# Patient Record
Sex: Female | Born: 1950
Health system: Southern US, Community
[De-identification: ages and names within clinical notes are randomized; demographics above are authoritative.]

## PROBLEM LIST (undated history)

## (undated) DIAGNOSIS — Z923 Personal history of irradiation: Secondary | ICD-10-CM

## (undated) DIAGNOSIS — J449 Chronic obstructive pulmonary disease, unspecified: Secondary | ICD-10-CM

## (undated) DIAGNOSIS — I70209 Unspecified atherosclerosis of native arteries of extremities, unspecified extremity: Secondary | ICD-10-CM

## (undated) DIAGNOSIS — R0600 Dyspnea, unspecified: Secondary | ICD-10-CM

## (undated) DIAGNOSIS — D649 Anemia, unspecified: Secondary | ICD-10-CM

## (undated) DIAGNOSIS — IMO0002 Reserved for concepts with insufficient information to code with codable children: Secondary | ICD-10-CM

## (undated) DIAGNOSIS — K219 Gastro-esophageal reflux disease without esophagitis: Secondary | ICD-10-CM

## (undated) DIAGNOSIS — Z1239 Encounter for other screening for malignant neoplasm of breast: Secondary | ICD-10-CM

## (undated) DIAGNOSIS — J189 Pneumonia, unspecified organism: Secondary | ICD-10-CM

## (undated) DIAGNOSIS — I2510 Atherosclerotic heart disease of native coronary artery without angina pectoris: Secondary | ICD-10-CM

## (undated) DIAGNOSIS — N393 Stress incontinence (female) (male): Secondary | ICD-10-CM

## (undated) DIAGNOSIS — E7800 Pure hypercholesterolemia, unspecified: Secondary | ICD-10-CM

## (undated) DIAGNOSIS — Z8719 Personal history of other diseases of the digestive system: Secondary | ICD-10-CM

## (undated) DIAGNOSIS — Z87891 Personal history of nicotine dependence: Secondary | ICD-10-CM

## (undated) DIAGNOSIS — C8590 Non-Hodgkin lymphoma, unspecified, unspecified site: Secondary | ICD-10-CM

## (undated) DIAGNOSIS — R1310 Dysphagia, unspecified: Secondary | ICD-10-CM

## (undated) DIAGNOSIS — K760 Fatty (change of) liver, not elsewhere classified: Secondary | ICD-10-CM

## (undated) DIAGNOSIS — M1990 Unspecified osteoarthritis, unspecified site: Secondary | ICD-10-CM

## (undated) DIAGNOSIS — R1909 Other intra-abdominal and pelvic swelling, mass and lump: Secondary | ICD-10-CM

## (undated) DIAGNOSIS — Z9221 Personal history of antineoplastic chemotherapy: Secondary | ICD-10-CM

## (undated) DIAGNOSIS — T7840XA Allergy, unspecified, initial encounter: Secondary | ICD-10-CM

## (undated) DIAGNOSIS — C774 Secondary and unspecified malignant neoplasm of inguinal and lower limb lymph nodes: Secondary | ICD-10-CM

## (undated) DIAGNOSIS — IMO0001 Reserved for inherently not codable concepts without codable children: Secondary | ICD-10-CM

## (undated) DIAGNOSIS — U071 COVID-19: Secondary | ICD-10-CM

## (undated) DIAGNOSIS — I10 Essential (primary) hypertension: Secondary | ICD-10-CM

## (undated) DIAGNOSIS — K648 Other hemorrhoids: Secondary | ICD-10-CM

## (undated) HISTORY — DX: Encounter for other screening for malignant neoplasm of breast: Z12.39

## (undated) HISTORY — DX: Personal history of antineoplastic chemotherapy: Z92.21

## (undated) HISTORY — DX: Allergy, unspecified, initial encounter: T78.40XA

## (undated) HISTORY — DX: Personal history of irradiation: Z92.3

## (undated) HISTORY — DX: Personal history of nicotine dependence: Z87.891

## (undated) HISTORY — DX: Reserved for concepts with insufficient information to code with codable children: IMO0002

## (undated) HISTORY — DX: Reserved for inherently not codable concepts without codable children: IMO0001

## (undated) HISTORY — DX: Essential (primary) hypertension: I10

## (undated) HISTORY — PX: SKIN CANCER EXCISION: SHX779

## (undated) HISTORY — DX: Secondary and unspecified malignant neoplasm of inguinal and lower limb lymph nodes: C77.4

## (undated) HISTORY — DX: Other hemorrhoids: K64.8

## (undated) HISTORY — DX: Pure hypercholesterolemia, unspecified: E78.00

## (undated) HISTORY — PX: FRACTURE SURGERY: SHX138

## (undated) HISTORY — PX: WRIST SURGERY: SHX841

---

## 1968-08-28 DIAGNOSIS — IMO0002 Reserved for concepts with insufficient information to code with codable children: Secondary | ICD-10-CM

## 1968-08-28 HISTORY — PX: APPENDECTOMY: SHX54

## 1968-08-28 HISTORY — DX: Reserved for concepts with insufficient information to code with codable children: IMO0002

## 1980-08-28 HISTORY — PX: PATELLA FRACTURE SURGERY: SHX735

## 2008-05-06 ENCOUNTER — Ambulatory Visit: Payer: Self-pay | Admitting: Family Medicine

## 2008-08-28 DIAGNOSIS — I10 Essential (primary) hypertension: Secondary | ICD-10-CM

## 2008-08-28 HISTORY — DX: Essential (primary) hypertension: I10

## 2010-01-10 ENCOUNTER — Ambulatory Visit: Payer: Self-pay | Admitting: Family Medicine

## 2011-02-01 ENCOUNTER — Ambulatory Visit: Payer: Self-pay | Admitting: Family Medicine

## 2011-08-29 DIAGNOSIS — Z923 Personal history of irradiation: Secondary | ICD-10-CM

## 2011-08-29 DIAGNOSIS — Z9221 Personal history of antineoplastic chemotherapy: Secondary | ICD-10-CM

## 2011-08-29 DIAGNOSIS — K648 Other hemorrhoids: Secondary | ICD-10-CM

## 2011-08-29 HISTORY — DX: Personal history of irradiation: Z92.3

## 2011-08-29 HISTORY — DX: Personal history of antineoplastic chemotherapy: Z92.21

## 2011-08-29 HISTORY — DX: Other hemorrhoids: K64.8

## 2012-01-09 ENCOUNTER — Ambulatory Visit: Payer: Self-pay | Admitting: General Surgery

## 2012-01-09 LAB — BASIC METABOLIC PANEL
BUN: 12 mg/dL (ref 7–18)
Co2: 27 mmol/L (ref 21–32)
EGFR (African American): >60
EGFR (Non-African Amer.): >60
Glucose: 97 mg/dL (ref 65–99)
Sodium: 139 mmol/L (ref 136–145)

## 2012-01-10 HISTORY — PX: HERNIA REPAIR: SHX51

## 2012-01-24 ENCOUNTER — Ambulatory Visit: Payer: Self-pay | Admitting: General Surgery

## 2012-01-24 HISTORY — PX: GROIN MASS OPEN BIOPSY: SHX1714

## 2012-02-01 ENCOUNTER — Ambulatory Visit: Payer: Self-pay | Admitting: General Surgery

## 2012-02-05 ENCOUNTER — Ambulatory Visit: Payer: Self-pay | Admitting: Oncology

## 2012-02-05 LAB — URINALYSIS, COMPLETE
Bacteria: NONE SEEN
Bilirubin,UR: NEGATIVE
Glucose,UR: NEGATIVE mg/dL (ref 0–75)
Ketone: NEGATIVE
Protein: NEGATIVE
RBC,UR: <1 /HPF (ref 0–5)
Specific Gravity: 1.018 (ref 1.003–1.030)
Squamous Epithelial: 3
WBC UR: 2 /HPF (ref 0–5)

## 2012-02-05 LAB — COMPREHENSIVE METABOLIC PANEL
Albumin: 4.4 g/dL (ref 3.4–5.0)
Alkaline Phosphatase: 68 U/L (ref 50–136)
Anion Gap: 9 (ref 7–16)
BUN: 15 mg/dL (ref 7–18)
Bilirubin,Total: 0.3 mg/dL (ref 0.2–1.0)
Chloride: 103 mmol/L (ref 98–107)
Co2: 29 mmol/L (ref 21–32)
EGFR (Non-African Amer.): >60
Glucose: 106 mg/dL — ABNORMAL HIGH (ref 65–99)
Osmolality: 283 (ref 275–301)
Potassium: 4.4 mmol/L (ref 3.5–5.1)
SGPT (ALT): 43 U/L
Total Protein: 7.9 g/dL (ref 6.4–8.2)

## 2012-02-05 LAB — CBC CANCER CENTER
Basophil #: 0 x10 3/mm (ref 0.0–0.1)
Eosinophil #: 0.1 x10 3/mm (ref 0.0–0.7)
Eosinophil %: 2.1 %
HGB: 14.1 g/dL (ref 12.0–16.0)
Lymphocyte #: 2.5 x10 3/mm (ref 1.0–3.6)
Monocyte #: 0.4 x10 3/mm (ref 0.2–0.9)
Monocyte %: 7.4 %
Neutrophil #: 2.7 x10 3/mm (ref 1.4–6.5)
Platelet: 269 x10 3/mm (ref 150–440)
RDW: 12.7 % (ref 11.5–14.5)
WBC: 5.8 x10 3/mm (ref 3.6–11.0)

## 2012-02-06 LAB — CEA: CEA: 2.2 ng/mL (ref 0.0–4.7)

## 2012-02-16 ENCOUNTER — Ambulatory Visit: Payer: Self-pay | Admitting: Otolaryngology

## 2012-02-26 ENCOUNTER — Ambulatory Visit: Payer: Self-pay | Admitting: Oncology

## 2012-02-27 ENCOUNTER — Ambulatory Visit: Payer: Self-pay | Admitting: Family Medicine

## 2012-03-07 ENCOUNTER — Ambulatory Visit: Payer: Self-pay | Admitting: Gynecologic Oncology

## 2012-03-07 LAB — BASIC METABOLIC PANEL
BUN: 15 mg/dL (ref 7–18)
Chloride: 106 mmol/L (ref 98–107)
Co2: 27 mmol/L (ref 21–32)
Creatinine: 0.7 mg/dL (ref 0.60–1.30)
Potassium: 4.2 mmol/L (ref 3.5–5.1)
Sodium: 140 mmol/L (ref 136–145)

## 2012-03-07 LAB — CBC
MCH: 31 pg (ref 26.0–34.0)
MCV: 90 fL (ref 80–100)
Platelet: 288 10*3/uL (ref 150–440)
RBC: 4.22 10*6/uL (ref 3.80–5.20)
RDW: 12.5 % (ref 11.5–14.5)

## 2012-03-11 ENCOUNTER — Ambulatory Visit: Payer: Self-pay | Admitting: Family Medicine

## 2012-03-12 ENCOUNTER — Ambulatory Visit: Payer: Self-pay | Admitting: Gynecologic Oncology

## 2012-03-18 LAB — PATHOLOGY REPORT

## 2012-03-27 HISTORY — PX: ANOSCOPY: SHX299

## 2012-03-28 ENCOUNTER — Ambulatory Visit: Payer: Self-pay | Admitting: Oncology

## 2012-04-08 LAB — CBC CANCER CENTER
Basophil %: 0.9 %
Eosinophil #: 0.1 x10 3/mm (ref 0.0–0.7)
Eosinophil %: 2 %
HCT: 39.4 % (ref 35.0–47.0)
HGB: 13.6 g/dL (ref 12.0–16.0)
Lymphocyte #: 2.3 x10 3/mm (ref 1.0–3.6)
MCHC: 34.5 g/dL (ref 32.0–36.0)
MCV: 90 fL (ref 80–100)
Monocyte #: 0.4 x10 3/mm (ref 0.2–0.9)
Monocyte %: 8.2 %
Neutrophil #: 2.4 x10 3/mm (ref 1.4–6.5)
RBC: 4.35 10*6/uL (ref 3.80–5.20)
WBC: 5.2 x10 3/mm (ref 3.6–11.0)

## 2012-04-08 LAB — BASIC METABOLIC PANEL
Anion Gap: 10 (ref 7–16)
Calcium, Total: 9.6 mg/dL (ref 8.5–10.1)
Co2: 28 mmol/L (ref 21–32)
Creatinine: 0.73 mg/dL (ref 0.60–1.30)
EGFR (African American): >60
Potassium: 4 mmol/L (ref 3.5–5.1)
Sodium: 141 mmol/L (ref 136–145)

## 2012-04-22 LAB — CBC CANCER CENTER
Basophil #: 0 x10 3/mm (ref 0.0–0.1)
Eosinophil #: 0.2 x10 3/mm (ref 0.0–0.7)
Eosinophil %: 2.4 %
HCT: 39.1 % (ref 35.0–47.0)
HGB: 13.4 g/dL (ref 12.0–16.0)
Lymphocyte #: 2.3 x10 3/mm (ref 1.0–3.6)
Lymphocyte %: 27.6 %
MCH: 31.2 pg (ref 26.0–34.0)
MCHC: 34.2 g/dL (ref 32.0–36.0)
Monocyte #: 0.7 x10 3/mm (ref 0.2–0.9)
Monocyte %: 8 %
Neutrophil #: 5.1 x10 3/mm (ref 1.4–6.5)
Neutrophil %: 61.6 %
Platelet: 241 x10 3/mm (ref 150–440)
RBC: 4.28 10*6/uL (ref 3.80–5.20)
WBC: 8.2 x10 3/mm (ref 3.6–11.0)

## 2012-04-22 LAB — COMPREHENSIVE METABOLIC PANEL
Albumin: 3.9 g/dL (ref 3.4–5.0)
Alkaline Phosphatase: 58 U/L (ref 50–136)
BUN: 15 mg/dL (ref 7–18)
Bilirubin,Total: 0.3 mg/dL (ref 0.2–1.0)
Calcium, Total: 9 mg/dL (ref 8.5–10.1)
Chloride: 101 mmol/L (ref 98–107)
Creatinine: 0.74 mg/dL (ref 0.60–1.30)
EGFR (African American): >60
EGFR (Non-African Amer.): >60
Osmolality: 277 (ref 275–301)
Potassium: 4.3 mmol/L (ref 3.5–5.1)
SGPT (ALT): 40 U/L (ref 12–78)

## 2012-04-28 ENCOUNTER — Ambulatory Visit: Payer: Self-pay | Admitting: Oncology

## 2012-04-30 LAB — CBC CANCER CENTER
Basophil %: 0.9 %
Eosinophil #: 0.2 x10 3/mm (ref 0.0–0.7)
Eosinophil %: 4.1 %
HCT: 37.4 % (ref 35.0–47.0)
HGB: 12.6 g/dL (ref 12.0–16.0)
Lymphocyte #: 1.2 x10 3/mm (ref 1.0–3.6)
Lymphocyte %: 28.3 %
MCH: 31 pg (ref 26.0–34.0)
MCV: 92 fL (ref 80–100)
Monocyte #: 0.5 x10 3/mm (ref 0.2–0.9)
Monocyte %: 11.9 %
Platelet: 199 x10 3/mm (ref 150–440)
RBC: 4.06 10*6/uL (ref 3.80–5.20)
WBC: 4.4 x10 3/mm (ref 3.6–11.0)

## 2012-04-30 LAB — COMPREHENSIVE METABOLIC PANEL
Albumin: 3.7 g/dL (ref 3.4–5.0)
Anion Gap: 8 (ref 7–16)
Bilirubin,Total: 0.3 mg/dL (ref 0.2–1.0)
Calcium, Total: 8.9 mg/dL (ref 8.5–10.1)
Chloride: 106 mmol/L (ref 98–107)
EGFR (African American): >60
Osmolality: 284 (ref 275–301)
Potassium: 4.2 mmol/L (ref 3.5–5.1)
SGOT(AST): 23 U/L (ref 15–37)
Sodium: 142 mmol/L (ref 136–145)
Total Protein: 6.9 g/dL (ref 6.4–8.2)

## 2012-05-07 LAB — CBC CANCER CENTER
Basophil #: 0 x10 3/mm (ref 0.0–0.1)
Eosinophil %: 5 %
HCT: 37.9 % (ref 35.0–47.0)
Lymphocyte #: 1 x10 3/mm (ref 1.0–3.6)
Lymphocyte %: 21.8 %
MCHC: 33.5 g/dL (ref 32.0–36.0)
MCV: 92 fL (ref 80–100)
Monocyte %: 10.1 %
Neutrophil #: 2.9 x10 3/mm (ref 1.4–6.5)
RBC: 4.13 10*6/uL (ref 3.80–5.20)
RDW: 13.6 % (ref 11.5–14.5)
WBC: 4.6 x10 3/mm (ref 3.6–11.0)

## 2012-05-07 LAB — COMPREHENSIVE METABOLIC PANEL
Albumin: 3.6 g/dL (ref 3.4–5.0)
Alkaline Phosphatase: 60 U/L (ref 50–136)
Anion Gap: 8 (ref 7–16)
BUN: 9 mg/dL (ref 7–18)
Bilirubin,Total: 0.2 mg/dL (ref 0.2–1.0)
Calcium, Total: 9.3 mg/dL (ref 8.5–10.1)
Creatinine: 0.84 mg/dL (ref 0.60–1.30)
EGFR (African American): >60
EGFR (Non-African Amer.): >60
Glucose: 126 mg/dL — ABNORMAL HIGH (ref 65–99)
Potassium: 4.3 mmol/L (ref 3.5–5.1)
SGOT(AST): 26 U/L (ref 15–37)
SGPT (ALT): 52 U/L (ref 12–78)
Total Protein: 6.9 g/dL (ref 6.4–8.2)

## 2012-05-14 LAB — CBC CANCER CENTER
Eosinophil %: 6.6 %
HGB: 12.8 g/dL (ref 12.0–16.0)
Lymphocyte #: 1.2 x10 3/mm (ref 1.0–3.6)
Lymphocyte %: 28.4 %
MCV: 91 fL (ref 80–100)
Monocyte %: 6.7 %
Neutrophil %: 57.4 %
Platelet: 189 x10 3/mm (ref 150–440)
RBC: 4.15 10*6/uL (ref 3.80–5.20)
WBC: 4.2 x10 3/mm (ref 3.6–11.0)

## 2012-05-14 LAB — COMPREHENSIVE METABOLIC PANEL
Albumin: 3.7 g/dL (ref 3.4–5.0)
Anion Gap: 6 — ABNORMAL LOW (ref 7–16)
BUN: 12 mg/dL (ref 7–18)
Bilirubin,Total: 0.2 mg/dL (ref 0.2–1.0)
Chloride: 105 mmol/L (ref 98–107)
EGFR (Non-African Amer.): >60
Glucose: 146 mg/dL — ABNORMAL HIGH (ref 65–99)
Osmolality: 284 (ref 275–301)
Potassium: 3.8 mmol/L (ref 3.5–5.1)
Sodium: 141 mmol/L (ref 136–145)
Total Protein: 6.9 g/dL (ref 6.4–8.2)

## 2012-05-21 LAB — COMPREHENSIVE METABOLIC PANEL
Albumin: 3.7 g/dL (ref 3.4–5.0)
Alkaline Phosphatase: 61 U/L (ref 50–136)
Anion Gap: 8 (ref 7–16)
Bilirubin,Total: 0.3 mg/dL (ref 0.2–1.0)
Calcium, Total: 9.3 mg/dL (ref 8.5–10.1)
Chloride: 104 mmol/L (ref 98–107)
Co2: 27 mmol/L (ref 21–32)
Creatinine: 0.86 mg/dL (ref 0.60–1.30)
EGFR (African American): >60
EGFR (Non-African Amer.): >60
Glucose: 130 mg/dL — ABNORMAL HIGH (ref 65–99)
Osmolality: 278 (ref 275–301)
Potassium: 4.4 mmol/L (ref 3.5–5.1)
Sodium: 139 mmol/L (ref 136–145)
Total Protein: 7.2 g/dL (ref 6.4–8.2)

## 2012-05-21 LAB — CBC CANCER CENTER
Basophil %: 0.8 %
Eosinophil #: 0.1 x10 3/mm (ref 0.0–0.7)
HCT: 38.4 % (ref 35.0–47.0)
HGB: 13 g/dL (ref 12.0–16.0)
Lymphocyte %: 11.8 %
MCH: 31.4 pg (ref 26.0–34.0)
Monocyte #: 0.6 x10 3/mm (ref 0.2–0.9)
Monocyte %: 6.3 %
Neutrophil #: 7 x10 3/mm — ABNORMAL HIGH (ref 1.4–6.5)
Neutrophil %: 80.1 %
RBC: 4.13 10*6/uL (ref 3.80–5.20)
WBC: 8.8 x10 3/mm (ref 3.6–11.0)

## 2012-05-28 ENCOUNTER — Ambulatory Visit: Payer: Self-pay | Admitting: Oncology

## 2012-06-28 ENCOUNTER — Ambulatory Visit: Payer: Self-pay | Admitting: Oncology

## 2012-07-09 LAB — COMPREHENSIVE METABOLIC PANEL
Albumin: 4.3 g/dL (ref 3.4–5.0)
Alkaline Phosphatase: 55 U/L (ref 50–136)
Anion Gap: 11 (ref 7–16)
BUN: 9 mg/dL (ref 7–18)
Bilirubin,Total: 0.2 mg/dL (ref 0.2–1.0)
Chloride: 103 mmol/L (ref 98–107)
Creatinine: 0.66 mg/dL (ref 0.60–1.30)
Glucose: 103 mg/dL — ABNORMAL HIGH (ref 65–99)
Potassium: 3.8 mmol/L (ref 3.5–5.1)
SGOT(AST): 31 U/L (ref 15–37)
Sodium: 142 mmol/L (ref 136–145)
Total Protein: 7.7 g/dL (ref 6.4–8.2)

## 2012-07-09 LAB — CBC CANCER CENTER
Basophil #: 0 x10 3/mm (ref 0.0–0.1)
Basophil %: 0.7 %
HCT: 40.4 % (ref 35.0–47.0)
HGB: 13.6 g/dL (ref 12.0–16.0)
Lymphocyte #: 1.1 x10 3/mm (ref 1.0–3.6)
Lymphocyte %: 35.6 %
MCV: 95 fL (ref 80–100)
Monocyte %: 10.6 %
Neutrophil #: 1.5 x10 3/mm (ref 1.4–6.5)
Platelet: 222 x10 3/mm (ref 150–440)
RBC: 4.26 10*6/uL (ref 3.80–5.20)
RDW: 15.2 % — ABNORMAL HIGH (ref 11.5–14.5)
WBC: 3 x10 3/mm — ABNORMAL LOW (ref 3.6–11.0)

## 2012-07-28 ENCOUNTER — Ambulatory Visit: Payer: Self-pay | Admitting: Oncology

## 2012-09-02 ENCOUNTER — Ambulatory Visit: Payer: Self-pay | Admitting: Oncology

## 2012-09-28 ENCOUNTER — Ambulatory Visit: Payer: Self-pay | Admitting: Oncology

## 2012-10-26 ENCOUNTER — Ambulatory Visit: Payer: Self-pay | Admitting: Oncology

## 2012-10-31 LAB — COMPREHENSIVE METABOLIC PANEL
Alkaline Phosphatase: 59 U/L (ref 50–136)
Anion Gap: 9 (ref 7–16)
Calcium, Total: 9.2 mg/dL (ref 8.5–10.1)
Chloride: 103 mmol/L (ref 98–107)
Co2: 30 mmol/L (ref 21–32)
Creatinine: 0.9 mg/dL (ref 0.60–1.30)
EGFR (Non-African Amer.): >60
Glucose: 99 mg/dL (ref 65–99)
Potassium: 4.3 mmol/L (ref 3.5–5.1)
SGOT(AST): 28 U/L (ref 15–37)
SGPT (ALT): 49 U/L (ref 12–78)
Sodium: 142 mmol/L (ref 136–145)
Total Protein: 7.6 g/dL (ref 6.4–8.2)

## 2012-10-31 LAB — CBC CANCER CENTER
Eosinophil #: 0 x10 3/mm (ref 0.0–0.7)
HCT: 41.2 % (ref 35.0–47.0)
HGB: 14.4 g/dL (ref 12.0–16.0)
Lymphocyte #: 1.6 x10 3/mm (ref 1.0–3.6)
Lymphocyte %: 30.5 %
MCH: 31.1 pg (ref 26.0–34.0)
MCHC: 34.9 g/dL (ref 32.0–36.0)
RBC: 4.62 10*6/uL (ref 3.80–5.20)
RDW: 12.9 % (ref 11.5–14.5)
WBC: 5.2 x10 3/mm (ref 3.6–11.0)

## 2012-11-26 ENCOUNTER — Ambulatory Visit: Payer: Self-pay | Admitting: Oncology

## 2012-12-26 ENCOUNTER — Ambulatory Visit: Payer: Self-pay | Admitting: Oncology

## 2013-01-26 ENCOUNTER — Ambulatory Visit: Payer: Self-pay | Admitting: Oncology

## 2013-02-03 LAB — COMPREHENSIVE METABOLIC PANEL
Alkaline Phosphatase: 59 U/L (ref 50–136)
Anion Gap: 7 (ref 7–16)
BUN: 9 mg/dL (ref 7–18)
EGFR (African American): >60
EGFR (Non-African Amer.): >60
Glucose: 103 mg/dL — ABNORMAL HIGH (ref 65–99)
Osmolality: 278 (ref 275–301)
SGOT(AST): 21 U/L (ref 15–37)
SGPT (ALT): 39 U/L (ref 12–78)
Sodium: 140 mmol/L (ref 136–145)
Total Protein: 7.4 g/dL (ref 6.4–8.2)

## 2013-02-03 LAB — CBC CANCER CENTER
Basophil #: 0 x10 3/mm (ref 0.0–0.1)
Basophil %: 0.7 %
Eosinophil %: 1.4 %
HGB: 14 g/dL (ref 12.0–16.0)
Lymphocyte %: 33.1 %
MCH: 32.4 pg (ref 26.0–34.0)
MCHC: 35.7 g/dL (ref 32.0–36.0)
MCV: 91 fL (ref 80–100)
Monocyte #: 0.4 x10 3/mm (ref 0.2–0.9)
Monocyte %: 8.1 %
Neutrophil #: 2.7 x10 3/mm (ref 1.4–6.5)
Neutrophil %: 56.7 %
RBC: 4.33 10*6/uL (ref 3.80–5.20)
RDW: 13.4 % (ref 11.5–14.5)
WBC: 4.8 x10 3/mm (ref 3.6–11.0)

## 2013-02-04 LAB — CA 125: CA 125: 10.9 U/mL (ref 0.0–34.0)

## 2013-02-25 ENCOUNTER — Ambulatory Visit: Payer: Self-pay | Admitting: Oncology

## 2013-03-28 ENCOUNTER — Ambulatory Visit: Payer: Self-pay | Admitting: Oncology

## 2013-05-02 ENCOUNTER — Ambulatory Visit: Payer: Self-pay | Admitting: Oncology

## 2013-05-05 ENCOUNTER — Ambulatory Visit: Payer: Self-pay | Admitting: Oncology

## 2013-05-28 ENCOUNTER — Ambulatory Visit: Payer: Self-pay | Admitting: Oncology

## 2013-06-28 ENCOUNTER — Ambulatory Visit: Payer: Self-pay | Admitting: Oncology

## 2013-08-01 ENCOUNTER — Ambulatory Visit: Payer: Self-pay | Admitting: Oncology

## 2013-08-04 ENCOUNTER — Ambulatory Visit: Payer: Self-pay | Admitting: Oncology

## 2013-08-28 ENCOUNTER — Ambulatory Visit: Payer: Self-pay | Admitting: Oncology

## 2013-09-29 ENCOUNTER — Ambulatory Visit: Payer: Self-pay | Admitting: Gynecologic Oncology

## 2013-10-06 ENCOUNTER — Encounter: Payer: Self-pay | Admitting: *Deleted

## 2013-10-06 DIAGNOSIS — C774 Secondary and unspecified malignant neoplasm of inguinal and lower limb lymph nodes: Secondary | ICD-10-CM | POA: Insufficient documentation

## 2013-10-06 DIAGNOSIS — Z9221 Personal history of antineoplastic chemotherapy: Secondary | ICD-10-CM | POA: Insufficient documentation

## 2013-10-26 ENCOUNTER — Ambulatory Visit: Payer: Self-pay | Admitting: Gynecologic Oncology

## 2013-12-01 ENCOUNTER — Ambulatory Visit: Payer: Self-pay | Admitting: Oncology

## 2013-12-01 LAB — COMPREHENSIVE METABOLIC PANEL
ALBUMIN: 4.1 g/dL (ref 3.4–5.0)
ALK PHOS: 57 U/L
ALT: 43 U/L (ref 12–78)
ANION GAP: 10 (ref 7–16)
AST: 24 U/L (ref 15–37)
BUN: 9 mg/dL (ref 7–18)
Bilirubin,Total: 0.3 mg/dL (ref 0.2–1.0)
CO2: 30 mmol/L (ref 21–32)
Calcium, Total: 9.9 mg/dL (ref 8.5–10.1)
Chloride: 103 mmol/L (ref 98–107)
Creatinine: 0.69 mg/dL (ref 0.60–1.30)
EGFR (African American): >60
Glucose: 108 mg/dL — ABNORMAL HIGH (ref 65–99)
Osmolality: 284 (ref 275–301)
Potassium: 4.6 mmol/L (ref 3.5–5.1)
Sodium: 143 mmol/L (ref 136–145)
TOTAL PROTEIN: 7.5 g/dL (ref 6.4–8.2)

## 2013-12-01 LAB — CBC CANCER CENTER
Basophil #: 0 x10 3/mm (ref 0.0–0.1)
Basophil %: 0.7 %
Eosinophil #: 0.1 x10 3/mm (ref 0.0–0.7)
Eosinophil %: 2.2 %
HCT: 41.8 % (ref 35.0–47.0)
HGB: 14.1 g/dL (ref 12.0–16.0)
Lymphocyte #: 1.6 x10 3/mm (ref 1.0–3.6)
Lymphocyte %: 37.2 %
MCH: 30.6 pg (ref 26.0–34.0)
MCHC: 33.6 g/dL (ref 32.0–36.0)
MCV: 91 fL (ref 80–100)
MONO ABS: 0.4 x10 3/mm (ref 0.2–0.9)
Monocyte %: 8.5 %
Neutrophil #: 2.2 x10 3/mm (ref 1.4–6.5)
Neutrophil %: 51.4 %
Platelet: 254 x10 3/mm (ref 150–440)
RBC: 4.6 10*6/uL (ref 3.80–5.20)
RDW: 13.2 % (ref 11.5–14.5)
WBC: 4.3 x10 3/mm (ref 3.6–11.0)

## 2013-12-26 ENCOUNTER — Ambulatory Visit: Payer: Self-pay | Admitting: Oncology

## 2014-01-02 DIAGNOSIS — R05 Cough: Secondary | ICD-10-CM | POA: Insufficient documentation

## 2014-01-02 DIAGNOSIS — R059 Cough, unspecified: Secondary | ICD-10-CM | POA: Insufficient documentation

## 2014-04-07 ENCOUNTER — Ambulatory Visit: Payer: Self-pay | Admitting: Oncology

## 2014-04-13 ENCOUNTER — Ambulatory Visit: Payer: Self-pay | Admitting: Gynecologic Oncology

## 2014-04-28 ENCOUNTER — Ambulatory Visit: Payer: Self-pay | Admitting: Oncology

## 2014-05-28 ENCOUNTER — Ambulatory Visit: Payer: Self-pay | Admitting: Oncology

## 2014-05-28 LAB — COMPREHENSIVE METABOLIC PANEL
ALK PHOS: 52 U/L
ALT: 44 U/L
ANION GAP: 9 (ref 7–16)
Albumin: 3.9 g/dL (ref 3.4–5.0)
BUN: 8 mg/dL (ref 7–18)
Bilirubin,Total: 0.4 mg/dL (ref 0.2–1.0)
CO2: 27 mmol/L (ref 21–32)
Calcium, Total: 9.5 mg/dL (ref 8.5–10.1)
Chloride: 104 mmol/L (ref 98–107)
Creatinine: 0.69 mg/dL (ref 0.60–1.30)
EGFR (Non-African Amer.): >60
GLUCOSE: 113 mg/dL — AB (ref 65–99)
OSMOLALITY: 279 (ref 275–301)
Potassium: 4.4 mmol/L (ref 3.5–5.1)
SGOT(AST): 21 U/L (ref 15–37)
Sodium: 140 mmol/L (ref 136–145)
Total Protein: 7.2 g/dL (ref 6.4–8.2)

## 2014-05-28 LAB — CBC CANCER CENTER
BASOS ABS: 0 x10 3/mm (ref 0.0–0.1)
Basophil %: 0.6 %
Eosinophil #: 0.1 x10 3/mm (ref 0.0–0.7)
Eosinophil %: 2.7 %
HCT: 42.8 % (ref 35.0–47.0)
HGB: 14.7 g/dL (ref 12.0–16.0)
LYMPHS PCT: 34.1 %
Lymphocyte #: 1.4 x10 3/mm (ref 1.0–3.6)
MCH: 31.4 pg (ref 26.0–34.0)
MCHC: 34.3 g/dL (ref 32.0–36.0)
MCV: 92 fL (ref 80–100)
MONOS PCT: 8.5 %
Monocyte #: 0.3 x10 3/mm (ref 0.2–0.9)
Neutrophil #: 2.2 x10 3/mm (ref 1.4–6.5)
Neutrophil %: 54.1 %
PLATELETS: 205 x10 3/mm (ref 150–440)
RBC: 4.68 10*6/uL (ref 3.80–5.20)
RDW: 13.7 % (ref 11.5–14.5)
WBC: 4.1 x10 3/mm (ref 3.6–11.0)

## 2014-06-28 ENCOUNTER — Ambulatory Visit: Payer: Self-pay | Admitting: Oncology

## 2014-06-29 ENCOUNTER — Encounter: Payer: Self-pay | Admitting: *Deleted

## 2014-07-21 ENCOUNTER — Ambulatory Visit: Payer: Self-pay | Admitting: Anesthesiology

## 2014-07-27 ENCOUNTER — Ambulatory Visit: Payer: Self-pay | Admitting: Anesthesiology

## 2014-08-17 ENCOUNTER — Ambulatory Visit: Payer: Self-pay | Admitting: Anesthesiology

## 2014-09-01 ENCOUNTER — Encounter: Payer: Self-pay | Admitting: Anesthesiology

## 2014-09-02 ENCOUNTER — Ambulatory Visit: Payer: Self-pay | Admitting: Oncology

## 2014-09-02 LAB — COMPREHENSIVE METABOLIC PANEL
ALT: 31 U/L
ANION GAP: 10 (ref 7–16)
AST: 16 U/L (ref 15–37)
Albumin: 4 g/dL (ref 3.4–5.0)
Alkaline Phosphatase: 51 U/L
BUN: 10 mg/dL (ref 7–18)
Bilirubin,Total: 0.4 mg/dL (ref 0.2–1.0)
CO2: 28 mmol/L (ref 21–32)
Calcium, Total: 9.5 mg/dL (ref 8.5–10.1)
Chloride: 104 mmol/L (ref 98–107)
Creatinine: 0.7 mg/dL (ref 0.60–1.30)
Glucose: 100 mg/dL — ABNORMAL HIGH (ref 65–99)
Osmolality: 282 (ref 275–301)
Potassium: 4.5 mmol/L (ref 3.5–5.1)
Sodium: 142 mmol/L (ref 136–145)
TOTAL PROTEIN: 7.2 g/dL (ref 6.4–8.2)

## 2014-09-02 LAB — CBC CANCER CENTER
BASOS ABS: 0 x10 3/mm (ref 0.0–0.1)
BASOS PCT: 0.5 %
EOS PCT: 1.4 %
Eosinophil #: 0.1 x10 3/mm (ref 0.0–0.7)
HCT: 41.9 % (ref 35.0–47.0)
HGB: 14.2 g/dL (ref 12.0–16.0)
Lymphocyte #: 1.8 x10 3/mm (ref 1.0–3.6)
Lymphocyte %: 35.5 %
MCH: 31 pg (ref 26.0–34.0)
MCHC: 34 g/dL (ref 32.0–36.0)
MCV: 91 fL (ref 80–100)
MONOS PCT: 8.6 %
Monocyte #: 0.4 x10 3/mm (ref 0.2–0.9)
NEUTROS ABS: 2.8 x10 3/mm (ref 1.4–6.5)
Neutrophil %: 54 %
Platelet: 287 x10 3/mm (ref 150–440)
RBC: 4.6 10*6/uL (ref 3.80–5.20)
RDW: 13.3 % (ref 11.5–14.5)
WBC: 5.1 x10 3/mm (ref 3.6–11.0)

## 2014-09-28 ENCOUNTER — Encounter: Payer: Self-pay | Admitting: Anesthesiology

## 2014-09-28 ENCOUNTER — Ambulatory Visit: Payer: Self-pay | Admitting: Oncology

## 2014-10-27 ENCOUNTER — Ambulatory Visit
Admit: 2014-10-27 | Disposition: A | Discharge status: Home / Self Care | Payer: Self-pay | Attending: Oncology | Admitting: Oncology

## 2014-10-27 ENCOUNTER — Encounter
Admit: 2014-10-27 | Disposition: A | Discharge status: Home / Self Care | Payer: Self-pay | Attending: Anesthesiology | Admitting: Anesthesiology

## 2014-11-27 ENCOUNTER — Ambulatory Visit
Admit: 2014-11-27 | Disposition: A | Discharge status: Home / Self Care | Payer: Self-pay | Attending: Oncology | Admitting: Oncology

## 2014-11-27 ENCOUNTER — Encounter
Admit: 2014-11-27 | Disposition: A | Discharge status: Home / Self Care | Payer: Self-pay | Attending: Anesthesiology | Admitting: Anesthesiology

## 2014-12-09 LAB — COMPREHENSIVE METABOLIC PANEL
ALBUMIN: 4.4 g/dL
ALK PHOS: 47 U/L
ANION GAP: 7 (ref 7–16)
BILIRUBIN TOTAL: 0.6 mg/dL
BUN: 10 mg/dL
CREATININE: 0.62 mg/dL
Calcium, Total: 9.3 mg/dL
Chloride: 103 mmol/L
Co2: 27 mmol/L
EGFR (African American): >60
EGFR (Non-African Amer.): >60
Glucose: 130 mg/dL — ABNORMAL HIGH
POTASSIUM: 3.7 mmol/L
SGOT(AST): 31 U/L
SGPT (ALT): 35 U/L
Sodium: 137 mmol/L
TOTAL PROTEIN: 7.2 g/dL

## 2014-12-09 LAB — CBC CANCER CENTER
BASOS ABS: 0 x10 3/mm (ref 0.0–0.1)
Basophil %: 0.8 %
Eosinophil #: 0.1 x10 3/mm (ref 0.0–0.7)
Eosinophil %: 1.6 %
HCT: 41.5 % (ref 35.0–47.0)
HGB: 14.2 g/dL (ref 12.0–16.0)
LYMPHS ABS: 1.7 x10 3/mm (ref 1.0–3.6)
Lymphocyte %: 35.2 %
MCH: 30.8 pg (ref 26.0–34.0)
MCHC: 34.2 g/dL (ref 32.0–36.0)
MCV: 90 fL (ref 80–100)
Monocyte #: 0.3 x10 3/mm (ref 0.2–0.9)
Monocyte %: 6.7 %
NEUTROS PCT: 55.7 %
Neutrophil #: 2.7 x10 3/mm (ref 1.4–6.5)
Platelet: 284 x10 3/mm (ref 150–440)
RBC: 4.61 10*6/uL (ref 3.80–5.20)
RDW: 12.9 % (ref 11.5–14.5)
WBC: 4.8 x10 3/mm (ref 3.6–11.0)
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
## 2014-12-15 NOTE — Op Note (Signed)
PATIENT NAME:  Kristen Simon, Kristen Simon MR#:  [id number] DATE OF BIRTH:  December 25, 1950  DATE OF PROCEDURE:  03/12/2012  PREOPERATIVE DIAGNOSIS: Metastatic squamous cell carcinoma in the inguinal node.   POSTOPERATIVE DIAGNOSIS: No definite evidence of cervical pathology.   PROCEDURES PERFORMED:  1. Examination under anesthesia.  2. Cold knife conization. 3. Dilatation and curettage.    SURGEON: Weber Cooks, MD  ANESTHESIA: General.   COMPLICATIONS: None.   ESTIMATED BLOOD LOSS: 25 mL.  INDICATION FOR SURGERY: Ms. [name] is a 64 year old patient who was noted to have squamous cell carcinoma in an inguinal node. Initial work up was unremarkable. Special stains of the tumor pointed towards cervical origin. Therefore decision was made to proceed with conization.   FINDINGS AT THE TIME OF SURGERY: Normal external genitalia, urethral meatus, urethra, bladder and vagina. Slightly irregular Lugol stain. Cervix without exocervical lesion. No irregular staining. On palpation the cervix and uterus are small in mid position, mobile and nontender. No adnexal masses or tenderness can be felt.   OPERATIVE REPORT: After adequate general anesthesia had been obtained, the patient was prepped and draped in high lithotomy position. The cervix was visualized. Holding stitches were placed at 9:00 and 6:00. Then Lugol stain was performed. Using the sharp knife a cone-shaped center portion of the cervix was removed. The cervix was then grasped with a single-tooth tenaculum and dilated. Thus, an endometrial curettage could be done. Only a small amount of tissue was received at that time. Then a stitch was placed around anterior and posterior cervical lips thus adequate hemostasis was assured. Small dab of Monsel solution was placed on the cervix.   The patient tolerated the procedure well and was taken to recovery room in stable condition. The postoperative urine was clear. Pad, sponge, needle, and instrument counts  were correct x2.   ____________________________ Weber Cooks, MD bem:cms D: 03/12/2012 13:53:51 ET T: 03/12/2012 14:12:47 ET JOB#: [id number]  cc: Weber Cooks, MD, <Dictator>  Weber Cooks MD ELECTRONICALLY SIGNED 03/19/2012 17:47

## 2014-12-20 NOTE — Op Note (Signed)
PATIENT NAME:  Kristen Simon, Kristen Simon MR#:  [id number] DATE OF BIRTH:  [birth year]  DATE OF PROCEDURE:  02/16/2012  PREOPERATIVE DIAGNOSIS: Right parapharyngeal PET positive tissue with history of right inguinal squamous cell carcinoma.   POSTOPERATIVE DIAGNOSIS: Right parapharyngeal PET positive tissue with history of right inguinal squamous cell carcinoma.   PROCEDURES:  1. Rigid bronchoscopy, direct laryngoscopy with multiple biopsies of the right parapharyngeal tissue, glossopharyngeal fold and right tongue base.  2. Tonsillectomy.  3. Rigid nasopharyngoscopy.   SURGEON: Janalee Dane, MD  DESCRIPTION OF PROCEDURE: The patient was placed in supine position on the Operating Room table. After the patient had been turned 90 degrees counterclockwise from anesthesia and the endotracheal tube had been removed, rigid bronchoscopy was carried out. The carina and primary bronchi were free of any masses or lesions. The endotracheal tube was replaced. Direct laryngoscopy was carried out. No visible lesions were encountered except for the previously noted lesion in the left tonsillar fossa and post-tonsillectomy scars in both tonsils. The left post-tonsillectomy scar and lesion were resected with Bovie electrocautery. The right posterior tonsillar tissue was resected with Bovie electrocautery. 0.5% plain Marcaine was placed. No further lesions were identified. 0 degrees rod was used to examine the nasopharynx. This was without masses or lesions therefore no biopsies were taken there. Patient was then returned to anesthesia, allowed to emerge from anesthesia in the Operating Room, taken to the recovery room in stable condition. There were no complications. Estimated blood loss less than 10 mL.   ____________________________ J. Nadeen Landau, MD jmc:cms D: 02/16/2012 21:07:36 ET T: 02/17/2012 10:09:07 ET  JOB#: [id number] cc: Janalee Dane, MD, <Dictator> Nicholos Johns MD ELECTRONICALLY SIGNED 02/21/2012  12:20

## 2014-12-20 NOTE — Op Note (Signed)
PATIENT NAME:  Kristen Simon, Kristen Simon MR#:  [id number] DATE OF BIRTH:  Jul 18, 1951  DATE OF PROCEDURE:  01/24/2012  PREOPERATIVE DIAGNOSIS: Right femoral hernia.   POSTOPERATIVE DIAGNOSIS: Circumscribed mass in the femoral canal on the right.   OPERATION PERFORMED: Exploration right groin with excision of a mass from the femoral canal.   SURGEON: Mckinley Jewel, MD   ANESTHESIA: General.   COMPLICATIONS: None.   ESTIMATED BLOOD LOSS: Minimal.   DRAINS: None.   PROCEDURE: The patient was put to sleep with an LMA and the right groin area was prepped and draped out as a sterile field. A palpable mass was located right in the area of the femoral canal which was suspicious for a femoral hernia. A transverse incision just overlying this area was made and carefully deepened through the layers down to the region of the femoral vessels and the femoral canal. Bleeding was controlled with cautery. The mass was identified and appeared to be pretty well circumscribed about 3 cm approximately in size, soft in places and firm in others. It did not appear to have any communication into the peritoneal cavity. It was entirely free and the femoral canal area did not appear to contain any evidence of a hernia. This mass was then excised out and sent in formalin for pathology. It was likely an enlarged node or a cystic lesion. No other lymphadenopathy or other abnormal findings were noted in this area. After ensuring hemostasis, the wound was closed with 3-0 Vicryl in the deeper tissues and the skin with subcuticular 4-0 Vicryl reinforced with Steri-Strips and dry sterile dressing was placed.      The patient tolerated the procedure well. There were no immediate problems. She was subsequently returned to the recovery room in stable condition.   ____________________________ S.Robinette Haines, MD sgs:drc D: 01/25/2012 09:18:35 ET T: 01/25/2012 11:45:36 ET JOB#: [id number]  cc: Synthia Innocent. Jamal Collin, MD, <Dictator> [facility] Robinette Haines  MD ELECTRONICALLY SIGNED 01/30/2012 10:33

## 2014-12-28 ENCOUNTER — Other Ambulatory Visit: Payer: Self-pay | Admitting: *Deleted

## 2014-12-28 MED ORDER — HYDROCODONE-ACETAMINOPHEN 10-325 MG PO TABS: 1.0000 | ORAL_TABLET | Freq: Two times a day (BID) | ORAL | Status: DC | PRN

## 2014-12-28 NOTE — Telephone Encounter (Signed)
Requesting refill on Norco.

## 2014-12-30 ENCOUNTER — Encounter: Payer: Self-pay | Admitting: Physical Therapy

## 2015-01-08 ENCOUNTER — Ambulatory Visit: Payer: BLUE CROSS/BLUE SHIELD | Admitting: Physical Therapy

## 2015-01-11 ENCOUNTER — Other Ambulatory Visit: Payer: Self-pay | Admitting: *Deleted

## 2015-01-11 MED ORDER — HYDROCODONE-ACETAMINOPHEN 10-325 MG PO TABS: 1.0000 | ORAL_TABLET | Freq: Two times a day (BID) | ORAL | Status: DC | PRN

## 2015-01-11 NOTE — Telephone Encounter (Signed)
Pt informed rx ready to be picked up.

## 2015-01-13 ENCOUNTER — Ambulatory Visit: Payer: BLUE CROSS/BLUE SHIELD | Attending: Anesthesiology | Admitting: Physical Therapy

## 2015-01-13 ENCOUNTER — Encounter: Payer: Self-pay | Admitting: Physical Therapy

## 2015-01-13 DIAGNOSIS — M6281 Muscle weakness (generalized): Secondary | ICD-10-CM | POA: Insufficient documentation

## 2015-01-13 DIAGNOSIS — S39001S Unspecified injury of muscle, fascia and tendon of abdomen, sequela: Secondary | ICD-10-CM

## 2015-01-13 DIAGNOSIS — M6208 Separation of muscle (nontraumatic), other site: Secondary | ICD-10-CM

## 2015-01-13 NOTE — Patient Instructions (Signed)
  On Elbows (Prone) Stay on elbows count 20 sec, breath and pull belly in on exhale.      (Home) Extension: Hip  Make sure low back does not move. Lift low.      Stand with support. Bend knees slightly. Tighten pelvic floor with exhale up. Return to straight standing and find pelvic neutral "zip zipper.  Repeat 5 times. Do 3 times a day.  Copyright  VHI. All rights reserved.

## 2015-01-13 NOTE — Therapy (Signed)
[place] MAIN [facility] SERVICES [address] [place], [place], [address] Phone: [phone]   Fax:  [phone]  Physical Therapy Treatment  Patient Details  Name: Kristen Simon MRN: [id number] Date of Birth: 08/12/1951 Referring Provider:  Molli Barrows, MD  Encounter Date: 01/13/2015      PT End of Session - 01/13/15 1456    Visit Number 3   Number of Visits 12   Date for PT Re-Evaluation 02/09/15   PT Start Time 6063   PT Stop Time 1140   PT Time Calculation (min) 55 min   Activity Tolerance Patient tolerated treatment well;No increased pain   Behavior During Therapy [facility] for tasks assessed/performed      Past Medical History  Diagnosis Date  . Hypertension 2010  . Personal history of tobacco use, presenting hazards to health   . 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 Hx of radiation therapy 2013  . Internal hemorrhoids with other complication 0160  . Breast screening, unspecified   . History of cancer chemotherapy 2013  . Unilateral or unspecified femoral hernia with obstruction   . Elevated cholesterol   . Ulcer 1970    stomach-resolved    Past Surgical History  Procedure Laterality Date  . Appendectomy  1970  . Fracture surgery  1093,2355    right wrist,right leg  . Groin mass open biopsy Right 01/24/2012    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  . Anoscopy  03/27/2012    anal biopsy/hemorrhoid-benign  . Hernia repair Right 01/10/2012    right femerol hernia    There were no vitals filed for this visit.  Visit Diagnosis:  Muscle weakness (generalized)  Unspecified injury of muscle, fascia and tendon of abdomen, sequela  Separation of abdominal muscles      Subjective Assessment - 01/13/15 1058    Subjective Two weeks ago pt suffered a relapse of back pain after picking up a pitcher of ice tea from lower fridge  shelf from a squat position. This epsiode limited her to bedrest. Sx have improved and her pain today is 4/10.    Pertinent History Cancer Lymphoma, radiation Tx to pelvic area,    Limitations Lifting;Standing;Walking;House hold activities;Other (comment);Sitting  car t/f    Patient Stated Goals 1) get out of pain and get in shape   Pain Score 4    Pain Location Back   Pain Orientation Medial   Pain Radiating Towards occasionally down lateral aspect of her R foot   Multiple Pain Sites Yes   Pain Score 6   Pain Location Groin   Pain Orientation Right   Pain Descriptors / Indicators Dull                         [facility] Adult PT Treatment/Exercise - 01/13/15 1518    Lumbar Exercises: Standing   Functional Squats 10 reps  educated on force closure, technique for lifting    Functional Squats Limitations pt reported needing to sit down after 10 reps, exprsesing fatigue   Knee/Hip Exercises: Prone   Other Prone Exercises prone on elbows and R hip ext with deep core engaged    Other Prone Exercises neuro re-edu on deep core activation to decrease SIJ pain with hip ext    Manual Therapy   Manual Therapy Joint mobilization;Soft tissue mobilization   Manual therapy comments Pre-Tx; Noted sacral  torsion R w/ pain w/ palpation on bil PSIS. increased tensions and tenderness on L coccygeus mm, PA Grade II mobs to R lateral edge of PSIS referred to R groin area.   Post-Tx: sacral symmetry, decreased tenderness/tensions    Joint Mobilization R long axis distraction and PA mob on Lateral border of R sacrum (prone) , MWM w/ R hip ext and prone on elbows   Soft tissue mobilization Applied sustained pressure and STM on L occygeus                PT Education - 01/13/15 1454    Education provided Yes   Education Details Provided pt DVD of hula dancing per pt's interest for increased pelvic ROM/ core strengthening, HEP, lifiting mechanics and hip mechanics related to her injury,  force closure principles: finding pelvic neutral in seated posture and slight anterior tilt of pelvis in squat with realignment into neutral upon rising to stand   Person(s) Educated Patient   Methods Explanation;Demonstration;Tactile cues;Verbal cues;Handout   Comprehension Verbalized understanding;Returned demonstration;Verbal cues required;Tactile cues required          PT Short Term Goals - 01/13/15 1500    PT SHORT TERM GOAL #1   Title --   Time --   Period --   Status --   PT SHORT TERM GOAL #2   Title --   Status --   PT SHORT TERM GOAL #3   Title --   Time --   Period --   Status --           PT Long Term Goals - 01/13/15 1502    PT LONG TERM GOAL #1   Title t will report sleeping continuously for 6 hrs decreased interruption by pain from 3-4x/night to 2x /night for 3/7 nights/week while sleeping on her R side in order to improve QOL. (11/17/14 amd 12/09/14  2-3x Patrcia Dolly instead 3-4x)   Time 12   Period Weeks   Status On-going   PT LONG TERM GOAL #2   Title Pt will report 50% less pain with sexual intercourse in order to improve QOL.   Time 12   Period Weeks   Status On-going   PT LONG TERM GOAL #3   Title  Pt will be able to lift groceries (2 bags) in each hand without pain to perform ADLs.    Time 12   Period Weeks   Status On-going               Plan - 01/13/15 1503    Clinical Impression Statement Pt had not been seen for over a month due travels and relapse of LBP. Pt has recovered from mm strain that occurred 2 weeks ago after lifting pitcher of tea from lower fridge shelf. Pt comes to today's session with 4/10 pain. Her pain decreased to 2/10 after manual Tx which was applied to address sacral torsion R associated with L coccygeus mm tensions. Pt will continue to benefit from skilled PT in order to decrease pain and to address goals related to functional lifting activities, pain with intercourse, and increased sleep quality.   Pt will benefit from  skilled therapeutic intervention in order to improve on the following deficits Abnormal gait;Decreased strength;Impaired sensation;Pain;Difficulty walking;Decreased activity tolerance;Decreased mobility;Decreased balance;Decreased coordination;Postural dysfunction;Decreased endurance;Impaired flexibility;Increased muscle spasms;Improper body mechanics;Increased fascial restricitons   Rehab Potential Good   PT Frequency 1x / week   PT Duration 12 weeks   PT Treatment/Interventions ADLs/Self Care Home Management;Functional mobility training;Wheelchair  mobility training;Energy conservation;Manual techniques;Therapeutic activities;Aquatic Therapy;Biofeedback;Therapeutic exercise;Moist Heat;Neuromuscular re-education;Balance training;Stair training;Patient/family education   PT Next Visit Plan progress strengthening HEP   Consulted and Agree with Plan of Care Patient        Problem List Patient Active Problem List   Diagnosis Date Noted  . 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 History of cancer chemotherapy     Jerl Mina ,PT, DPT, E-RYT  01/13/2015, 3:19 PM  [place] MAIN [facility] SERVICES [address] [place], [place], [address] Phone: [phone]   Fax:  [phone]

## 2015-01-22 ENCOUNTER — Ambulatory Visit: Payer: Self-pay | Admitting: Physical Therapy

## 2015-01-29 ENCOUNTER — Ambulatory Visit: Payer: BLUE CROSS/BLUE SHIELD | Admitting: Physical Therapy

## 2015-01-29 DIAGNOSIS — R0602 Shortness of breath: Secondary | ICD-10-CM | POA: Insufficient documentation

## 2015-01-29 DIAGNOSIS — I10 Essential (primary) hypertension: Secondary | ICD-10-CM | POA: Insufficient documentation

## 2015-01-29 DIAGNOSIS — E782 Mixed hyperlipidemia: Secondary | ICD-10-CM | POA: Insufficient documentation

## 2015-02-04 ENCOUNTER — Encounter: Payer: BLUE CROSS/BLUE SHIELD | Admitting: Physical Therapy

## 2015-02-05 ENCOUNTER — Ambulatory Visit: Payer: BLUE CROSS/BLUE SHIELD | Admitting: Physical Therapy

## 2015-02-08 ENCOUNTER — Ambulatory Visit
Admission: RE | Admit: 2015-02-08 | Payer: BLUE CROSS/BLUE SHIELD | Source: Ambulatory Visit | Admitting: Unknown Physician Specialty

## 2015-02-08 ENCOUNTER — Encounter: Admission: RE | Payer: Self-pay | Source: Ambulatory Visit

## 2015-02-08 SURGERY — COLONOSCOPY WITH PROPOFOL
Anesthesia: General

## 2015-02-09 ENCOUNTER — Telehealth: Payer: Self-pay | Admitting: *Deleted

## 2015-02-09 MED ORDER — HYDROCODONE-ACETAMINOPHEN 10-325 MG PO TABS: 0.5000 | ORAL_TABLET | Freq: Four times a day (QID) | ORAL | Status: DC | PRN

## 2015-02-09 NOTE — Telephone Encounter (Signed)
Deferred UTI to PMD

## 2015-02-09 NOTE — Telephone Encounter (Signed)
Informed that prescription is ready to pick up  

## 2015-02-12 ENCOUNTER — Encounter: Payer: BLUE CROSS/BLUE SHIELD | Admitting: Physical Therapy

## 2015-02-17 ENCOUNTER — Ambulatory Visit: Payer: BLUE CROSS/BLUE SHIELD | Attending: Anesthesiology | Admitting: Physical Therapy

## 2015-02-25 ENCOUNTER — Telehealth: Payer: Self-pay | Admitting: *Deleted

## 2015-02-25 MED ORDER — HYDROCODONE-ACETAMINOPHEN 10-325 MG PO TABS
0.5000 | ORAL_TABLET | Freq: Four times a day (QID) | ORAL | Status: DC | PRN
Start: 2015-02-25 — End: 2015-03-25

## 2015-02-25 NOTE — Telephone Encounter (Signed)
Informed that prescription is ready to pick up  

## 2015-02-26 ENCOUNTER — Encounter: Payer: BLUE CROSS/BLUE SHIELD | Admitting: Physical Therapy

## 2015-03-05 ENCOUNTER — Encounter: Payer: BLUE CROSS/BLUE SHIELD | Admitting: Physical Therapy

## 2015-03-25 ENCOUNTER — Telehealth: Payer: Self-pay | Admitting: *Deleted

## 2015-03-25 MED ORDER — HYDROCODONE-ACETAMINOPHEN 10-325 MG PO TABS: 0.5000 | ORAL_TABLET | Freq: Four times a day (QID) | ORAL | Status: DC | PRN

## 2015-03-25 NOTE — Telephone Encounter (Signed)
Informed that prescription is ready to pick up  

## 2015-04-07 ENCOUNTER — Ambulatory Visit: Payer: BLUE CROSS/BLUE SHIELD

## 2015-04-27 ENCOUNTER — Other Ambulatory Visit: Payer: Self-pay | Admitting: *Deleted

## 2015-04-27 MED ORDER — HYDROCODONE-ACETAMINOPHEN 10-325 MG PO TABS: 0.5000 | ORAL_TABLET | Freq: Four times a day (QID) | ORAL | Status: DC | PRN

## 2015-04-28 ENCOUNTER — Inpatient Hospital Stay: Payer: BLUE CROSS/BLUE SHIELD

## 2015-05-05 ENCOUNTER — Inpatient Hospital Stay: Payer: BLUE CROSS/BLUE SHIELD

## 2015-05-12 ENCOUNTER — Inpatient Hospital Stay: Payer: BLUE CROSS/BLUE SHIELD | Attending: Obstetrics and Gynecology | Admitting: Obstetrics and Gynecology

## 2015-05-12 VITALS — BP 159/90 | HR 99 | Temp 97.6°F | Resp 18 | Ht 62.0 in | Wt 141.5 lb

## 2015-05-12 DIAGNOSIS — Z716 Tobacco abuse counseling: Secondary | ICD-10-CM

## 2015-05-12 DIAGNOSIS — Z79899 Other long term (current) drug therapy: Secondary | ICD-10-CM | POA: Insufficient documentation

## 2015-05-12 DIAGNOSIS — Z9221 Personal history of antineoplastic chemotherapy: Secondary | ICD-10-CM | POA: Diagnosis not present

## 2015-05-12 DIAGNOSIS — I10 Essential (primary) hypertension: Secondary | ICD-10-CM | POA: Insufficient documentation

## 2015-05-12 DIAGNOSIS — Z923 Personal history of irradiation: Secondary | ICD-10-CM | POA: Diagnosis not present

## 2015-05-12 DIAGNOSIS — F17210 Nicotine dependence, cigarettes, uncomplicated: Secondary | ICD-10-CM | POA: Insufficient documentation

## 2015-05-12 DIAGNOSIS — C774 Secondary and unspecified malignant neoplasm of inguinal and lower limb lymph nodes: Secondary | ICD-10-CM | POA: Diagnosis not present

## 2015-05-12 DIAGNOSIS — C801 Malignant (primary) neoplasm, unspecified: Secondary | ICD-10-CM

## 2015-05-12 DIAGNOSIS — E780 Pure hypercholesterolemia: Secondary | ICD-10-CM | POA: Diagnosis not present

## 2015-05-12 NOTE — Patient Instructions (Signed)
Smoking Cessation Quitting smoking is important to your health and has many advantages. However, it is not always easy to quit since nicotine is a very addictive drug. Oftentimes, people try 3 times or more before being able to quit. This document explains the best ways for you to prepare to quit smoking. Quitting takes hard work and a lot of effort, but you can do it. ADVANTAGES OF QUITTING SMOKING  You will live longer, feel better, and live better.  Your body will feel the impact of quitting smoking almost immediately.  Within 20 minutes, blood pressure decreases. Your pulse returns to its normal level.  After 8 hours, carbon monoxide levels in the blood return to normal. Your oxygen level increases.  After 24 hours, the chance of having a heart attack starts to decrease. Your breath, hair, and body stop smelling like smoke.  After 48 hours, damaged nerve endings begin to recover. Your sense of taste and smell improve.  After 72 hours, the body is virtually free of nicotine. Your bronchial tubes relax and breathing becomes easier.  After 2 to 12 weeks, lungs can hold more air. Exercise becomes easier and circulation improves.  The risk of having a heart attack, stroke, cancer, or lung disease is greatly reduced.  After 1 year, the risk of coronary heart disease is cut in half.  After 5 years, the risk of stroke falls to the same as a nonsmoker.  After 10 years, the risk of lung cancer is cut in half and the risk of other cancers decreases significantly.  After 15 years, the risk of coronary heart disease drops, usually to the level of a nonsmoker.  If you are pregnant, quitting smoking will improve your chances of having a healthy baby.  The people you live with, especially any children, will be healthier.  You will have extra money to spend on things other than cigarettes. QUESTIONS TO THINK ABOUT BEFORE ATTEMPTING TO QUIT You may want to talk about your answers with your  health care provider.  Why do you want to quit?  If you tried to quit in the past, what helped and what did not?  What will be the most difficult situations for you after you quit? How will you plan to handle them?  Who can help you through the tough times? Your family? Friends? A health care provider?  What pleasures do you get from smoking? What ways can you still get pleasure if you quit? Here are some questions to ask your health care provider:  How can you help me to be successful at quitting?  What medicine do you think would be best for me and how should I take it?  What should I do if I need more help?  What is smoking withdrawal like? How can I get information on withdrawal? GET READY  Set a quit date.  Change your environment by getting rid of all cigarettes, ashtrays, matches, and lighters in your home, car, or work. Do not let people smoke in your home.  Review your past attempts to quit. Think about what worked and what did not. GET SUPPORT AND ENCOURAGEMENT You have a better chance of being successful if you have help. You can get support in many ways.  Tell your family, friends, and coworkers that you are going to quit and need their support. Ask them not to smoke around you.  Get individual, group, or telephone counseling and support. Programs are available at local hospitals and health centers. Call   your local health department for information about programs in your area.  Spiritual beliefs and practices may help some smokers quit.  Download a "quit meter" on your computer to keep track of quit statistics, such as how long you have gone without smoking, cigarettes not smoked, and money saved.  Get a self-help book about quitting smoking and staying off tobacco. LEARN NEW SKILLS AND BEHAVIORS  Distract yourself from urges to smoke. Talk to someone, go for a walk, or occupy your time with a task.  Change your normal routine. Take a different route to work.  Drink tea instead of coffee. Eat breakfast in a different place.  Reduce your stress. Take a hot bath, exercise, or read a book.  Plan something enjoyable to do every day. Reward yourself for not smoking.  Explore interactive web-based programs that specialize in helping you quit. GET MEDICINE AND USE IT CORRECTLY Medicines can help you stop smoking and decrease the urge to smoke. Combining medicine with the above behavioral methods and support can greatly increase your chances of successfully quitting smoking.  Nicotine replacement therapy helps deliver nicotine to your body without the negative effects and risks of smoking. Nicotine replacement therapy includes nicotine gum, lozenges, inhalers, nasal sprays, and skin patches. Some may be available over-the-counter and others require a prescription.  Antidepressant medicine helps people abstain from smoking, but how this works is unknown. This medicine is available by prescription.  Nicotinic receptor partial agonist medicine simulates the effect of nicotine in your brain. This medicine is available by prescription. Ask your health care provider for advice about which medicines to use and how to use them based on your health history. Your health care provider will tell you what side effects to look out for if you choose to be on a medicine or therapy. Carefully read the information on the package. Do not use any other product containing nicotine while using a nicotine replacement product.  RELAPSE OR DIFFICULT SITUATIONS Most relapses occur within the first 3 months after quitting. Do not be discouraged if you start smoking again. Remember, most people try several times before finally quitting. You may have symptoms of withdrawal because your body is used to nicotine. You may crave cigarettes, be irritable, feel very hungry, cough often, get headaches, or have difficulty concentrating. The withdrawal symptoms are only temporary. They are strongest  when you first quit, but they will go away within 10-14 days. To reduce the chances of relapse, try to:  Avoid drinking alcohol. Drinking lowers your chances of successfully quitting.  Reduce the amount of caffeine you consume. Once you quit smoking, the amount of caffeine in your body increases and can give you symptoms, such as a rapid heartbeat, sweating, and anxiety.  Avoid smokers because they can make you want to smoke.  Do not let weight gain distract you. Many smokers will gain weight when they quit, usually less than 10 pounds. Eat a healthy diet and stay active. You can always lose the weight gained after you quit.  Find ways to improve your mood other than smoking. FOR MORE INFORMATION  www.smokefree.gov  Document Released: 08/08/2001 Document Revised: 12/29/2013 Document Reviewed: 11/23/2011 [employer] Patient Information 2015 [employer]. This information is not intended to replace advice given to you by your health care provider. Make sure you discuss any questions you have with your health care provider.  

## 2015-05-12 NOTE — Progress Notes (Signed)
Assisted MD with pelvic exam

## 2015-05-12 NOTE — Progress Notes (Addendum)
Gynecologic Oncology Interval Visit   Referring Provider: Referred by Dr. Oliva Bustard.   Chief Concern: Continued surveillance for squamous carcinoma noted in a R inguinal node, presumed cervical cancer  Subjective:  Kristen Simon is a 64 y.o. female who is seen for continued surveillance for squamous carcinoma noted in a R inguinal node, presumed cervical cancer.    She last saw Dr. Sabra Heck in August 2015 and had a negative Pap smear at that time. She saw Dr. Oliva Bustard March 2016 for a follow-up CT scan that was negative for evidence of recurrent disease.  10/21/2014 CT scan Abdomen and pelvis IMPRESSION: 1. No evidence of recurrent or metastatic disease. 2. Question mild hepatic steatosis. 3. Suspect a 2.5 cm uterine fibroid.  Overall she continues to do well. She has intermittent symptoms that are noted in the review of symptoms below.   Gynecologic Oncology  History Kristen Simon is a very pleasant patient with a history of squamous carcinoma noted in a R inguinal node, presumed metastatic cervical cancer.   12/2011        squamous carcinoma noted in a R inguinal node,CT /  Pet scan without definite primary,              anal exam WNL, pelvic exam normal, but incomplete due to discomfort, ECC neg,   tissue evaluation c/w/ cervical primary,                   chemo-XRT, completed NED 05/2012  Problem List: Patient Active Problem List   Diagnosis Date Noted  . 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 History of cancer chemotherapy     Past Medical History: Past Medical History  Diagnosis Date  . Hypertension 2010  . Personal history of tobacco use, presenting hazards to health   . 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 Hx of radiation therapy 2013  . Internal hemorrhoids with other complication 9678  . Breast screening, unspecified   . History of cancer  chemotherapy 2013  . Unilateral or unspecified femoral hernia with obstruction   . Elevated cholesterol   . Ulcer 1970    stomach-resolved    Past Surgical History: Past Surgical History  Procedure Laterality Date  . Appendectomy  1970  . Fracture surgery  [id number]    right wrist,right leg  . Groin mass open biopsy Right 01/24/2012    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  . Anoscopy  03/27/2012    anal biopsy/hemorrhoid-benign  . Hernia repair Right 01/10/2012    right femerol hernia    Past Gynecologic History:   Gravida 1   Para 1   Age at Menarche 53   Age at Menopause 71   Regular Pap Smears Yes   Additional Hx patient does not recall any gyn problems in the past,    OB History:  OB History  Obstetric Comments  Age with first menstruation-9  LMP-age 89    Family History: Family History  Problem Relation Age of Onset  . Cancer Mother     lung    Social History: Social History   Social History  . Marital Status: Married    Spouse Name: N/A  . Number of Children: N/A  . Years of Education: N/A   Occupational History  . Not on file.   Social History Main Topics  . Smoking  status: Current Every Day Smoker -- 0.50 packs/day for 40 years    Types: Cigarettes  . Smokeless tobacco: Not on file  . Alcohol Use: No  . Drug Use: No  . Sexual Activity: No   Other Topics Concern  . Not on file   Social History Narrative    Allergies: No Known Allergies  Current Medications: Current Outpatient Prescriptions  Medication Sig Dispense Refill  . albuterol (PROVENTIL HFA;VENTOLIN HFA) 108 (90 BASE) MCG/ACT inhaler Inhale into the lungs every 6 (six) hours as needed for wheezing or shortness of breath.    Marland Kitchen amLODipine (NORVASC) 5 MG tablet Take 1 tablet by mouth daily.    Marland Kitchen aspirin 81 MG tablet Take 81 mg by mouth daily.    . fluticasone (FLONASE) 50 MCG/ACT nasal spray Place 2 sprays into the nose daily.     Marland Kitchen HYDROcodone-acetaminophen (NORCO) 10-325 MG per tablet Take 0.5 tablets by mouth 4 (four) times daily as needed. 60 tablet 0  . Hyprom-Naphaz-Polysorb-Zn Sulf (CLEAR EYES COMPLETE OP) Apply 2 drops to eye daily as needed (itching).    . Lactobacillus ([employer] PROBIOTIC ACIDOPHILUS) 10 MG CAPS Take 1 tablet by mouth daily.    Marland Kitchen loratadine (CLARITIN) 10 MG tablet Take 10 mg by mouth daily as needed for allergies.    Marland Kitchen senna-docusate (SENOKOT-S) 8.6-50 MG per tablet Take 1 tablet by mouth at bedtime as needed.     . cholecalciferol (VITAMIN D) 1000 UNITS tablet Take 1,000 Units by mouth daily.    . cyclobenzaprine (FLEXERIL) 5 MG tablet Take 0.5 tablets by mouth 2 (two) times daily as needed.    . Omega-3 Fatty Acids (FISH OIL) 1200 MG CAPS Take 1 capsule by mouth daily.     No current facility-administered medications for this visit.    Review of Systems General: fatigue Pulmonary: dyspnea Cardiac: negative Gastrointestinal: positive for abdominal pain, constipation and poor appetite, negative for, nausea, vomiting, diarrhea. She is currently on a diet and trying to reduce her food intake.  Genitourinary/Sexual: negative Ob/Gyn: negative for, irregular bleeding, pain Musculoskeletal: positive for back pain Neurologic/Psych: positive for paresthesia   Re: Symptoms are intermittent and occur occasionally.  Objective:  Physical Examination:  BP 159/90 mmHg  Pulse 99  Temp(Src) 97.6 F (36.4 C) (Tympanic)  Resp 18  Ht 5\' 2"  (1.575 m)  Wt 141 lb 8.6 oz (64.2 kg)  BMI 25.88 kg/m2   ECOG Performance Status: 0 - Asymptomatic  General appearance: alert, cooperative and appears stated age HEENT:PERRLA, extra ocular movement intact and sclera clear, anicteric Lymph node survey: non-palpable, axillary, inguinal, supraclavicular. The right groin is tender but there are no palpable masses. Cardiovascular: regular rate and rhythm Respiratory: normal air entry, lungs clear to  auscultation Abdomen: soft, non-tender, without masses or organomegaly, no hernias and well healed incision Back: inspection of back is normal Extremities: extremities normal, atraumatic, no cyanosis or edema Neurological exam reveals alert, oriented, normal speech, no focal findings or movement disorder noted.  Pelvic: exam chaperoned by nurse;  Vulva: normal appearing vulva with no masses, tenderness or lesions; Vagina: normal vagina on BME she has a small 1 cm nodule to the right of midline that is tender; Adnexa: normal adnexa in size, nontender and no masses; Uterus: uterus is normal size, shape, consistency and nontender; Cervix: no lesions appears stenotic os; Rectal: normal rectal, no masses    Lab Review Labs on site today: n/a  Radiologic Imaging: As noted above    Assessment:  Kristen Celeste  A Amalia Simon is a 64 y.o. female diagnosed with  squamous carcinoma noted in a R inguinal node, presumed cervical cancer s/p  Chemotherapy and radiation, clinically NED . Medical co-morbidities complicating care: Tobacco use.  Plan:    We discussed continued close follow up with exams, including pelvic exams every 6 months until 5 years and then annually thereafter with her PCP or gynecologist. Her PCP, Dr. Maryland Pink. Cervical/vaginal cytology may be considered annually, and I discussed this with the patient. Her last Pap was normal. It is thought that the Pap smears have limited value for detecting recurrent cervical cancer. Imaging and laboratory assessment is based on clinical indication. Patient education was provided for smoking cessation.   Suggested return to clinic in  6 months.    The patient's diagnosis, an outline of the further diagnostic and laboratory studies which will be required, the recommendation, and alternatives were discussed.  All questions were answered to the patient's satisfaction.   Gillis Ends, MD  ADDENDUM: 05/13/2015 Dr. Barbarann Ehlers pager number  [phone]. I will also send him an EPIC message regarding follow up.  Gillis Ends, MD    CC:  Dr. Maryland Pink

## 2015-05-28 ENCOUNTER — Other Ambulatory Visit: Payer: Self-pay | Admitting: *Deleted

## 2015-05-28 MED ORDER — HYDROCODONE-ACETAMINOPHEN 10-325 MG PO TABS: 0.5000 | ORAL_TABLET | Freq: Four times a day (QID) | ORAL | Status: DC | PRN

## 2015-06-09 ENCOUNTER — Ambulatory Visit: Payer: BLUE CROSS/BLUE SHIELD | Admitting: Oncology

## 2015-06-09 ENCOUNTER — Other Ambulatory Visit: Payer: BLUE CROSS/BLUE SHIELD

## 2015-06-18 ENCOUNTER — Other Ambulatory Visit: Payer: Self-pay | Admitting: *Deleted

## 2015-06-18 DIAGNOSIS — C779 Secondary and unspecified malignant neoplasm of lymph node, unspecified: Secondary | ICD-10-CM

## 2015-06-24 ENCOUNTER — Inpatient Hospital Stay (HOSPITAL_BASED_OUTPATIENT_CLINIC_OR_DEPARTMENT_OTHER): Payer: BLUE CROSS/BLUE SHIELD | Admitting: Oncology

## 2015-06-24 ENCOUNTER — Encounter: Payer: Self-pay | Admitting: Oncology

## 2015-06-24 ENCOUNTER — Inpatient Hospital Stay: Payer: BLUE CROSS/BLUE SHIELD | Attending: Oncology

## 2015-06-24 VITALS — BP 148/82 | HR 78 | Temp 96.9°F | Wt 142.4 lb

## 2015-06-24 DIAGNOSIS — Z79899 Other long term (current) drug therapy: Secondary | ICD-10-CM | POA: Insufficient documentation

## 2015-06-24 DIAGNOSIS — C774 Secondary and unspecified malignant neoplasm of inguinal and lower limb lymph nodes: Secondary | ICD-10-CM | POA: Diagnosis not present

## 2015-06-24 DIAGNOSIS — Z1231 Encounter for screening mammogram for malignant neoplasm of breast: Secondary | ICD-10-CM

## 2015-06-24 DIAGNOSIS — E7800 Pure hypercholesterolemia, unspecified: Secondary | ICD-10-CM | POA: Diagnosis not present

## 2015-06-24 DIAGNOSIS — C801 Malignant (primary) neoplasm, unspecified: Secondary | ICD-10-CM | POA: Diagnosis not present

## 2015-06-24 DIAGNOSIS — R42 Dizziness and giddiness: Secondary | ICD-10-CM | POA: Insufficient documentation

## 2015-06-24 DIAGNOSIS — I10 Essential (primary) hypertension: Secondary | ICD-10-CM | POA: Diagnosis not present

## 2015-06-24 DIAGNOSIS — F17210 Nicotine dependence, cigarettes, uncomplicated: Secondary | ICD-10-CM | POA: Diagnosis not present

## 2015-06-24 DIAGNOSIS — C779 Secondary and unspecified malignant neoplasm of lymph node, unspecified: Secondary | ICD-10-CM

## 2015-06-24 DIAGNOSIS — Z23 Encounter for immunization: Secondary | ICD-10-CM | POA: Insufficient documentation

## 2015-06-24 LAB — CBC WITH DIFFERENTIAL/PLATELET
Basophils Absolute: 0 K/uL (ref 0–0.1)
Basophils Relative: 1 %
Eosinophils Absolute: 0.1 K/uL (ref 0–0.7)
Eosinophils Relative: 2 %
HCT: 42.1 % (ref 35.0–47.0)
Hemoglobin: 14.5 g/dL (ref 12.0–16.0)
Lymphocytes Relative: 42 %
Lymphs Abs: 1.8 K/uL (ref 1.0–3.6)
MCH: 30.5 pg (ref 26.0–34.0)
MCHC: 34.5 g/dL (ref 32.0–36.0)
MCV: 88.3 fL (ref 80.0–100.0)
Monocytes Absolute: 0.3 K/uL (ref 0.2–0.9)
Monocytes Relative: 8 %
Neutro Abs: 2.1 K/uL (ref 1.4–6.5)
Neutrophils Relative %: 47 %
Platelets: 320 K/uL (ref 150–440)
RBC: 4.77 MIL/uL (ref 3.80–5.20)
RDW: 12.9 % (ref 11.5–14.5)
WBC: 4.3 K/uL (ref 3.6–11.0)

## 2015-06-24 LAB — COMPREHENSIVE METABOLIC PANEL WITH GFR
ALT: 27 U/L (ref 14–54)
AST: 25 U/L (ref 15–41)
Albumin: 4.5 g/dL (ref 3.5–5.0)
Alkaline Phosphatase: 59 U/L (ref 38–126)
Anion gap: 6 (ref 5–15)
BUN: 11 mg/dL (ref 6–20)
CO2: 26 mmol/L (ref 22–32)
Calcium: 9 mg/dL (ref 8.9–10.3)
Chloride: 102 mmol/L (ref 101–111)
Creatinine, Ser: 0.51 mg/dL (ref 0.44–1.00)
GFR calc Af Amer: >60 mL/min
GFR calc non Af Amer: >60 mL/min
Glucose, Bld: 110 mg/dL — ABNORMAL HIGH (ref 65–99)
Potassium: 4.2 mmol/L (ref 3.5–5.1)
Sodium: 134 mmol/L — ABNORMAL LOW (ref 135–145)
Total Bilirubin: 0.4 mg/dL (ref 0.3–1.2)
Total Protein: 7.5 g/dL (ref 6.5–8.1)

## 2015-06-24 MED ORDER — INFLUENZA VAC SPLIT QUAD 0.5 ML IM SUSY
0.5000 mL | PREFILLED_SYRINGE | Freq: Once | INTRAMUSCULAR | Status: AC
  Administered 2015-06-24: 0.5 mL via INTRAMUSCULAR

## 2015-06-24 MED ORDER — HYDROCODONE-ACETAMINOPHEN 10-325 MG PO TABS: 0.5000 | ORAL_TABLET | Freq: Four times a day (QID) | ORAL | Status: DC | PRN

## 2015-06-24 MED ORDER — AMOXICILLIN 500 MG PO TABS: 500.0000 mg | ORAL_TABLET | Freq: Three times a day (TID) | ORAL | Status: DC

## 2015-06-24 NOTE — Progress Notes (Signed)
Requesting refill for Hydrocodone.  Need to schedule Mammogram.  Patient seen recently for ear infection.  Currently on meclazine.  Originally placed on abx but now out and dizziness has not resolved.  Patient has had area removed from her face and it is now back and larger. Patient further complains of being weak and tired.

## 2015-06-25 ENCOUNTER — Encounter: Payer: Self-pay | Admitting: Oncology

## 2015-06-25 NOTE — Progress Notes (Signed)
Kristen Simon @ [facility] [phone]  [phone]     Kristen Simon OB: Dec 10, 1950  MR#: [id number]  STM#:[id number]  Patient Care Team: [place] Pink, MD as PCP - General (Family Medicine)  CHIEF COMPLAINT:  Chief Complaint  Patient presents with  . Dizziness   Chief Complaint/Diagnosis:   12/2011  squamous carcinoma noted in a R inguinal node,CT /  Pet scan without definite primary, tissue evaluation c/w/ cervical primary, chemo-XRT, completed NED 05/2012 HPI:   64 year old lady who has developed pain in the right inguinal area.  Senna is undergone evaluation with CT scan.  According to patient after taking laxative and having bowel movement pain has improved   INTERVAL HISTORY: 64 year old patient with squamous cell carcinoma in writing wine a lymph node status post resection and radiation and chemotherapy.  Since treatment patient continues to have pain in the right pelvic area.  Previous evaluation refill possibility of radiation-induced bone necrosis.  Patient is getting physiotherapy taking at least the 3 pills a Vicodin every day.  Patient has refused in the past pain clinic appointment.  Continues to feel weak and tired  Here for further follow-up and treatment consideration REVIEW OF SYSTEMS:    general status: Patient is feeling weak and tired.  No change in a performance status.  No chills.  No fever. HEENT: Alopecia.  No evidence of stomatitis According to patient's had a sinus infection was treated with amoxicillin patient had dizziness at that time patient responded to antibiotics.  Recently started dizziness again. Lungs: No cough or shortness of breath Cardiac: No chest pain or paroxysmal nocturnal dyspnea GI: No nausea no vomiting no diarrhea no abdominal pain Skin: No rash Lower extremity no swelling Neurological system: continues to have pain in the right pelvic area Taking 2 or 3 tablets of Vicodin daily Musculoskeletal system no bony  pains  As per HPI. Otherwise, a complete review of systems is negatve.  PAST MEDICAL HISTORY: Past Medical History  Diagnosis Date  . Hypertension 2010  . Personal history of tobacco use, presenting hazards to health   . 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 Hx of radiation therapy 2013  . Internal hemorrhoids with other complication 8921  . Breast screening, unspecified   . History of cancer chemotherapy 2013  . Unilateral or unspecified femoral hernia with obstruction   . Elevated cholesterol   . Ulcer 1970    stomach-resolved    PAST SURGICAL HISTORY: Past Surgical History  Procedure Laterality Date  . Appendectomy  1970  . Fracture surgery  1941,7408    right wrist,right leg  . Groin mass open biopsy Right 01/24/2012    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  . Anoscopy  03/27/2012    anal biopsy/hemorrhoid-benign  . Hernia repair Right 01/10/2012    right femerol hernia    FAMILY HISTORY Family History  Problem Relation Age of Onset  . Cancer Mother     lung   Preventive Screening:  Has patient had any of the following test? Colonscopy  Mammography  Pap Smear (1)   Last Colonoscopy: in 1980's done in [place](1)   Last Mammography: 2013(1)   Last Pap Smear: Dr Wallene Huh and Dr. Sabra Heck [phone])   Smoking History: Smoking History smoked for 40 years , continues to smoke  PFSH: Comments: Family history of lung cancer.No family history of colorectal cancer, breast cancer, or ovarian cancer.  Comments: Patient has  smoked one pack per day for 40 years.  Quit smoking approximately few weeks ago.  Does not drink  Additional Past Medical and Surgical History: No significant past medical history   ADVANCED DIRECTIVES:  Patient does have advance healthcare directive, Patient   does not desire to make any changes HEALTH MAINTENANCE: Social History  Substance Use Topics  .  Smoking status: Current Every Day Smoker -- 0.50 packs/day for 40 years    Types: Cigarettes  . Smokeless tobacco: None  . Alcohol Use: No      No Known Allergies  Current Outpatient Prescriptions  Medication Sig Dispense Refill  . albuterol (PROVENTIL HFA;VENTOLIN HFA) 108 (90 BASE) MCG/ACT inhaler Inhale into the lungs every 6 (six) hours as needed for wheezing or shortness of breath.    Marland Kitchen amLODipine (NORVASC) 5 MG tablet Take 1 tablet by mouth daily.    Marland Kitchen aspirin 81 MG tablet Take 81 mg by mouth daily.    . fluticasone (FLONASE) 50 MCG/ACT nasal spray Place 2 sprays into the nose daily.    Marland Kitchen HYDROcodone-acetaminophen (NORCO) 10-325 MG tablet Take 0.5 tablets by mouth 4 (four) times daily as needed. 60 tablet 0  . Hyprom-Naphaz-Polysorb-Zn Sulf (CLEAR EYES COMPLETE OP) Apply 2 drops to eye daily as needed (itching).    . Lactobacillus ([employer]S PROBIOTIC ACIDOPHILUS) 10 MG CAPS Take 1 tablet by mouth daily.    . meclizine (ANTIVERT) 25 MG tablet     . senna-docusate (SENOKOT-S) 8.6-50 MG per tablet Take 1 tablet by mouth at bedtime as needed.     Marland Kitchen amoxicillin (AMOXIL) 500 MG tablet Take 1 tablet (500 mg total) by mouth 3 (three) times daily. 30 tablet 0   No current facility-administered medications for this visit.    OBJECTIVE:  Filed Vitals:   06/24/15 1223  BP: 148/82  Pulse: 78  Temp: 96.9 F (36.1 C)     Body mass index is 26.04 kg/(m^2).    ECOG FS:1 - Symptomatic but completely ambulatory  PHYSICAL EXAM: GENERAL:  Well developed, well nourished, sitting comfortably in the exam room in no acute distress. MENTAL STATUS:  Alert and oriented to person, place and time.  ENT:  Oropharynx clear without lesion.  Tongue normal. Mucous membranes moist.  RESPIRATORY:  Clear to auscultation without rales, wheezes or rhonchi. CARDIOVASCULAR:  Regular rate and rhythm without murmur, rub or gallop. BREAST:  Right breast without masses, skin changes or nipple discharge.  Left breast  without masses, skin changes or nipple discharge. ABDOMEN:  Soft, non-tender, with active bowel sounds, and no hepatosplenomegaly.  No masses. BACK:  No CVA tenderness.  No tenderness on percussion of the back or rib cage. SKIN:  No rashes, ulcers or lesions. EXTREMITIES: No edema, no skin discoloration or tenderness.  No palpable cords. LYMPH NODES: No palpable cervical, supraclavicular, axillary or inguinal adenopathy  NEUROLOGICAL: Unremarkable. PSYCH:  Appropriate.   LAB RESULTS:  CBC Latest Ref Rng 06/24/2015 12/09/2014  WBC 3.6 - 11.0 K/uL 4.3 4.8  Hemoglobin 12.0 - 16.0 g/dL 14.5 14.2  Hematocrit 35.0 - 47.0 % 42.1 41.5  Platelets 150 - 440 K/uL 320 284    Appointment on 06/24/2015  Component Date Value Ref Range Status  . WBC 06/24/2015 4.3  3.6 - 11.0 K/uL Final  . RBC 06/24/2015 4.77  3.80 - 5.20 MIL/uL Final  . Hemoglobin 06/24/2015 14.5  12.0 - 16.0 g/dL Final  . HCT 06/24/2015 42.1  35.0 - 47.0 % Final  . MCV 06/24/2015 88.3  80.0 - 100.0 fL Final  . MCH 06/24/2015 30.5  26.0 - 34.0 pg Final  . MCHC 06/24/2015 34.5  32.0 - 36.0 g/dL Final  . RDW 06/24/2015 12.9  11.5 - 14.5 % Final  . Platelets 06/24/2015 320  150 - 440 K/uL Final  . Neutrophils Relative % 06/24/2015 47   Final  . Neutro Abs 06/24/2015 2.1  1.4 - 6.5 K/uL Final  . Lymphocytes Relative 06/24/2015 42   Final  . Lymphs Abs 06/24/2015 1.8  1.0 - 3.6 K/uL Final  . Monocytes Relative 06/24/2015 8   Final  . Monocytes Absolute 06/24/2015 0.3  0.2 - 0.9 K/uL Final  . Eosinophils Relative 06/24/2015 2   Final  . Eosinophils Absolute 06/24/2015 0.1  0 - 0.7 K/uL Final  . Basophils Relative 06/24/2015 1   Final  . Basophils Absolute 06/24/2015 0.0  0 - 0.1 K/uL Final  . Sodium 06/24/2015 134* 135 - 145 mmol/L Final  . Potassium 06/24/2015 4.2  3.5 - 5.1 mmol/L Final  . Chloride 06/24/2015 102  101 - 111 mmol/L Final  . CO2 06/24/2015 26  22 - 32 mmol/L Final  . Glucose, Bld 06/24/2015 110* 65 - 99 mg/dL  Final  . BUN 06/24/2015 11  6 - 20 mg/dL Final  . Creatinine, Ser 06/24/2015 0.51  0.44 - 1.00 mg/dL Final  . Calcium 06/24/2015 9.0  8.9 - 10.3 mg/dL Final  . Total Protein 06/24/2015 7.5  6.5 - 8.1 g/dL Final  . Albumin 06/24/2015 4.5  3.5 - 5.0 g/dL Final  . AST 06/24/2015 25  15 - 41 U/L Final  . ALT 06/24/2015 27  14 - 54 U/L Final  . Alkaline Phosphatase 06/24/2015 59  38 - 126 U/L Final  . Total Bilirubin 06/24/2015 0.4  0.3 - 1.2 mg/dL Final  . GFR calc non Af Amer 06/24/2015 >60  >60 mL/min Final  . GFR calc Af Amer 06/24/2015 >60  >60 mL/min Final   Comment: (NOTE) The eGFR has been calculated using the CKD EPI equation. This calculation has not been validated in all clinical situations. eGFR's persistently <60 mL/min signify possible Chronic Kidney Disease.   . Anion gap 06/24/2015 6  5 - 15 Final    ASSESSMENT: Right pelvic pain continues chronic. Continue Vicodin again offered pain clinic appointment Dizziness except etiology is not clear we will prescribe amoxicillin but if dizziness continues patient may need further evaluation with ENT as well as MRI scan of brain Patient continues to smoke Continues to feel weak and tired but all lab data as are within normal limit    Patient expressed understanding and was in agreement with this plan. She also understands that She can call clinic at any time with any questions, concerns, or complaints.    No matching staging information was found for the patient.  Forest Gleason, MD   06/25/2015 12:44 PM

## 2015-07-02 ENCOUNTER — Ambulatory Visit
Admission: RE | Admit: 2015-07-02 | Discharge: 2015-07-02 | Disposition: A | Discharge status: Home / Self Care | Payer: BLUE CROSS/BLUE SHIELD | Source: Ambulatory Visit | Attending: Oncology | Admitting: Oncology

## 2015-07-02 DIAGNOSIS — Z1231 Encounter for screening mammogram for malignant neoplasm of breast: Secondary | ICD-10-CM | POA: Insufficient documentation

## 2015-07-26 ENCOUNTER — Telehealth: Payer: Self-pay | Admitting: *Deleted

## 2015-07-26 DIAGNOSIS — C774 Secondary and unspecified malignant neoplasm of inguinal and lower limb lymph nodes: Secondary | ICD-10-CM

## 2015-07-26 MED ORDER — HYDROCODONE-ACETAMINOPHEN 10-325 MG PO TABS: 0.5000 | ORAL_TABLET | Freq: Four times a day (QID) | ORAL | Status: DC | PRN

## 2015-07-26 NOTE — Telephone Encounter (Signed)
Informed that prescription is ready to pick up  

## 2015-08-25 ENCOUNTER — Other Ambulatory Visit: Payer: Self-pay | Admitting: *Deleted

## 2015-08-25 DIAGNOSIS — C774 Secondary and unspecified malignant neoplasm of inguinal and lower limb lymph nodes: Secondary | ICD-10-CM

## 2015-08-25 MED ORDER — HYDROCODONE-ACETAMINOPHEN 10-325 MG PO TABS: 0.5000 | ORAL_TABLET | Freq: Four times a day (QID) | ORAL | Status: DC | PRN

## 2015-09-24 ENCOUNTER — Telehealth: Payer: Self-pay | Admitting: *Deleted

## 2015-09-24 DIAGNOSIS — C774 Secondary and unspecified malignant neoplasm of inguinal and lower limb lymph nodes: Secondary | ICD-10-CM

## 2015-09-24 MED ORDER — HYDROCODONE-ACETAMINOPHEN 10-325 MG PO TABS: 0.5000 | ORAL_TABLET | Freq: Four times a day (QID) | ORAL | Status: DC | PRN

## 2015-09-24 NOTE — Telephone Encounter (Signed)
Informed that prescription is ready to pick up  

## 2015-10-25 ENCOUNTER — Other Ambulatory Visit: Payer: Self-pay | Admitting: *Deleted

## 2015-10-25 DIAGNOSIS — C774 Secondary and unspecified malignant neoplasm of inguinal and lower limb lymph nodes: Secondary | ICD-10-CM

## 2015-10-25 MED ORDER — HYDROCODONE-ACETAMINOPHEN 10-325 MG PO TABS: 0.5000 | ORAL_TABLET | Freq: Four times a day (QID) | ORAL | Status: DC | PRN

## 2015-11-10 ENCOUNTER — Inpatient Hospital Stay: Payer: BLUE CROSS/BLUE SHIELD

## 2015-11-23 ENCOUNTER — Other Ambulatory Visit: Payer: Self-pay | Admitting: *Deleted

## 2015-11-23 DIAGNOSIS — C774 Secondary and unspecified malignant neoplasm of inguinal and lower limb lymph nodes: Secondary | ICD-10-CM

## 2015-11-23 MED ORDER — HYDROCODONE-ACETAMINOPHEN 10-325 MG PO TABS: 0.5000 | ORAL_TABLET | Freq: Four times a day (QID) | ORAL | Status: DC | PRN

## 2015-12-04 ENCOUNTER — Emergency Department: Payer: BLUE CROSS/BLUE SHIELD

## 2015-12-04 ENCOUNTER — Emergency Department
Admission: EM | Admit: 2015-12-04 | Discharge: 2015-12-04 | Disposition: A | Discharge status: Home / Self Care | Payer: BLUE CROSS/BLUE SHIELD | Attending: Emergency Medicine | Admitting: Emergency Medicine

## 2015-12-04 ENCOUNTER — Encounter: Payer: Self-pay | Admitting: Emergency Medicine

## 2015-12-04 DIAGNOSIS — E784 Other hyperlipidemia: Secondary | ICD-10-CM | POA: Diagnosis not present

## 2015-12-04 DIAGNOSIS — C774 Secondary and unspecified malignant neoplasm of inguinal and lower limb lymph nodes: Secondary | ICD-10-CM | POA: Insufficient documentation

## 2015-12-04 DIAGNOSIS — Z7982 Long term (current) use of aspirin: Secondary | ICD-10-CM | POA: Insufficient documentation

## 2015-12-04 DIAGNOSIS — Z87891 Personal history of nicotine dependence: Secondary | ICD-10-CM | POA: Insufficient documentation

## 2015-12-04 DIAGNOSIS — I10 Essential (primary) hypertension: Secondary | ICD-10-CM

## 2015-12-04 DIAGNOSIS — Z79899 Other long term (current) drug therapy: Secondary | ICD-10-CM | POA: Diagnosis not present

## 2015-12-04 LAB — BASIC METABOLIC PANEL
ANION GAP: 6 (ref 5–15)
BUN: 10 mg/dL (ref 6–20)
CALCIUM: 9.7 mg/dL (ref 8.9–10.3)
CO2: 27 mmol/L (ref 22–32)
Chloride: 107 mmol/L (ref 101–111)
Creatinine, Ser: 0.58 mg/dL (ref 0.44–1.00)
GFR calc Af Amer: >60 mL/min (ref >60)
GLUCOSE: 110 mg/dL — AB (ref 65–99)
Potassium: 4 mmol/L (ref 3.5–5.1)
Sodium: 140 mmol/L (ref 135–145)

## 2015-12-04 LAB — CBC WITH DIFFERENTIAL/PLATELET
BASOS ABS: 0 10*3/uL (ref 0–0.1)
BASOS PCT: 1 %
EOS ABS: 0.1 10*3/uL (ref 0–0.7)
EOS PCT: 2 %
HEMATOCRIT: 36.4 % (ref 35.0–47.0)
Hemoglobin: 12.8 g/dL (ref 12.0–16.0)
Lymphocytes Relative: 26 %
Lymphs Abs: 1 10*3/uL (ref 1.0–3.6)
MCH: 30.8 pg (ref 26.0–34.0)
MCHC: 35.1 g/dL (ref 32.0–36.0)
MCV: 87.8 fL (ref 80.0–100.0)
MONO ABS: 0.4 10*3/uL (ref 0.2–0.9)
Monocytes Relative: 11 %
NEUTROS ABS: 2.4 10*3/uL (ref 1.4–6.5)
Neutrophils Relative %: 60 %
PLATELETS: 236 10*3/uL (ref 150–440)
RBC: 4.15 MIL/uL (ref 3.80–5.20)
RDW: 12.8 % (ref 11.5–14.5)
WBC: 3.9 10*3/uL (ref 3.6–11.0)

## 2015-12-04 LAB — TROPONIN I

## 2015-12-04 NOTE — ED Notes (Signed)
Returned from CT.

## 2015-12-04 NOTE — ED Notes (Signed)
Patient states headache is returning. States 3/10 to left side of head.

## 2015-12-04 NOTE — Discharge Instructions (Signed)
DASH Eating Plan °DASH stands for "Dietary Approaches to Stop Hypertension." The DASH eating plan is a healthy eating plan that has been shown to reduce high blood pressure (hypertension). Additional health benefits may include reducing the risk of type 2 diabetes mellitus, heart disease, and stroke. The DASH eating plan may also help with weight loss. °WHAT DO I NEED TO KNOW ABOUT THE DASH EATING PLAN? °For the DASH eating plan, you will follow these general guidelines: °· Choose foods with a percent daily value for sodium of less than 5% (as listed on the food label). °· Use salt-free seasonings or herbs instead of table salt or sea salt. °· Check with your health care provider or pharmacist before using salt substitutes. °· Eat lower-sodium products, often labeled as "lower sodium" or "no salt added." °· Eat fresh foods. °· Eat more vegetables, fruits, and low-fat dairy products. °· Choose whole grains. Look for the word "whole" as the first word in the ingredient list. °· Choose fish and skinless chicken or turkey more often than red meat. Limit fish, poultry, and meat to 6 oz (170 g) each day. °· Limit sweets, desserts, sugars, and sugary drinks. °· Choose heart-healthy fats. °· Limit cheese to 1 oz (28 g) per day. °· Eat more home-cooked food and less restaurant, buffet, and fast food. °· Limit fried foods. °· Cook foods using methods other than frying. °· Limit canned vegetables. If you do use them, rinse them well to decrease the sodium. °· When eating at a restaurant, ask that your food be prepared with less salt, or no salt if possible. °WHAT FOODS CAN I EAT? °Seek help from a dietitian for individual calorie needs. °Grains °Whole grain or whole wheat bread. Brown rice. Whole grain or whole wheat pasta. Quinoa, bulgur, and whole grain cereals. Low-sodium cereals. Corn or whole wheat flour tortillas. Whole grain cornbread. Whole grain crackers. Low-sodium crackers. °Vegetables °Fresh or frozen vegetables  (raw, steamed, roasted, or grilled). Low-sodium or reduced-sodium tomato and vegetable juices. Low-sodium or reduced-sodium tomato sauce and paste. Low-sodium or reduced-sodium canned vegetables.  °Fruits °All fresh, canned (in natural juice), or frozen fruits. °Meat and Other Protein Products °Ground beef (85% or leaner), grass-fed beef, or beef trimmed of fat. Skinless chicken or turkey. Ground chicken or turkey. Pork trimmed of fat. All fish and seafood. Eggs. Dried beans, peas, or lentils. Unsalted nuts and seeds. Unsalted canned beans. °Dairy °Low-fat dairy products, such as skim or 1% milk, 2% or reduced-fat cheeses, low-fat ricotta or cottage cheese, or plain low-fat yogurt. Low-sodium or reduced-sodium cheeses. °Fats and Oils °Tub margarines without trans fats. Light or reduced-fat mayonnaise and salad dressings (reduced sodium). Avocado. Safflower, olive, or canola oils. Natural peanut or almond butter. °Other °Unsalted popcorn and pretzels. °The items listed above may not be a complete list of recommended foods or beverages. Contact your dietitian for more options. °WHAT FOODS ARE NOT RECOMMENDED? °Grains °White bread. White pasta. White rice. Refined cornbread. Bagels and croissants. Crackers that contain trans fat. °Vegetables °Creamed or fried vegetables. Vegetables in a cheese sauce. Regular canned vegetables. Regular canned tomato sauce and paste. Regular tomato and vegetable juices. °Fruits °Dried fruits. Canned fruit in light or heavy syrup. Fruit juice. °Meat and Other Protein Products °Fatty cuts of meat. Ribs, chicken wings, bacon, sausage, bologna, salami, chitterlings, fatback, hot dogs, bratwurst, and packaged luncheon meats. Salted nuts and seeds. Canned beans with salt. °Dairy °Whole or 2% milk, cream, half-and-half, and cream cheese. Whole-fat or sweetened yogurt. Full-fat   cheeses or blue cheese. Nondairy creamers and whipped toppings. Processed cheese, cheese spreads, or cheese  curds. °Condiments °Onion and garlic salt, seasoned salt, table salt, and sea salt. Canned and packaged gravies. Worcestershire sauce. Tartar sauce. Barbecue sauce. Teriyaki sauce. Soy sauce, including reduced sodium. Steak sauce. Fish sauce. Oyster sauce. Cocktail sauce. Horseradish. Ketchup and mustard. Meat flavorings and tenderizers. Bouillon cubes. Hot sauce. Tabasco sauce. Marinades. Taco seasonings. Relishes. °Fats and Oils °Butter, stick margarine, lard, shortening, ghee, and bacon fat. Coconut, palm kernel, or palm oils. Regular salad dressings. °Other °Pickles and olives. Salted popcorn and pretzels. °The items listed above may not be a complete list of foods and beverages to avoid. Contact your dietitian for more information. °WHERE CAN I FIND MORE INFORMATION? °National Heart, [employer], [employer]: www.nhlbi.nih.gov/health/health-topics/topics/dash/ °  °This information is not intended to replace advice given to you by your health care provider. Make sure you discuss any questions you have with your health care provider. °  °Document Released: 08/03/2011 Document Revised: 09/04/2014 Document Reviewed: 06/18/2013 °Elsevier Interactive Patient Education ©2016 [employer] Inc[employer] ° °Hypertension °Hypertension, commonly called high blood pressure, is when the force of blood pumping through your arteries is too strong. Your arteries are the blood vessels that carry blood from your heart throughout your body. A blood pressure reading consists of a higher number over a lower number, such as 110/72. The higher number (systolic) is the pressure inside your arteries when your heart pumps. The lower number (diastolic) is the pressure inside your arteries when your heart relaxes. Ideally you want your blood pressure below 120/80. °Hypertension forces your heart to work harder to pump blood. Your arteries may become narrow or stiff. Having untreated or uncontrolled hypertension can cause heart attack, stroke, kidney  disease, and other problems. °RISK FACTORS °Some risk factors for high blood pressure are controllable. Others are not.  °Risk factors you cannot control include:  °· Race. You may be at higher risk if you are African American. °· Age. Risk increases with age. °· Gender. Men are at higher risk than women before age 45 years. After age 65, women are at higher risk than men. °Risk factors you can control include: °· Not getting enough exercise or physical activity. °· Being overweight. °· Getting too much fat, sugar, calories, or salt in your diet. °· Drinking too much alcohol. °SIGNS AND SYMPTOMS °Hypertension does not usually cause signs or symptoms. Extremely high blood pressure (hypertensive crisis) may cause headache, anxiety, shortness of breath, and nosebleed. °DIAGNOSIS °To check if you have hypertension, your health care provider will measure your blood pressure while you are seated, with your arm held at the level of your heart. It should be measured at least twice using the same arm. Certain conditions can cause a difference in blood pressure between your right and left arms. A blood pressure reading that is higher than normal on one occasion does not mean that you need treatment. If it is not clear whether you have high blood pressure, you may be asked to return on a different day to have your blood pressure checked again. Or, you may be asked to monitor your blood pressure at home for 1 or more weeks. °TREATMENT °Treating high blood pressure includes making lifestyle changes and possibly taking medicine. Living a healthy lifestyle can help lower high blood pressure. You may need to change some of your habits. °Lifestyle changes may include: °· Following the DASH diet. This diet is high in fruits, vegetables, and whole   grains. It is low in salt, red meat, and added sugars. °· Keep your sodium intake below 2,300 mg per day. °· Getting at least 30-45 minutes of aerobic exercise at least 4 times per  week. °· Losing weight if necessary. °· Not smoking. °· Limiting alcoholic beverages. °· Learning ways to reduce stress. °Your health care provider may prescribe medicine if lifestyle changes are not enough to get your blood pressure under control, and if one of the following is true: °· You are 18-59 years of age and your systolic blood pressure is above 140. °· You are 60 years of age or older, and your systolic blood pressure is above 150. °· Your diastolic blood pressure is above 90. °· You have diabetes, and your systolic blood pressure is over 140 or your diastolic blood pressure is over 90. °· You have kidney disease and your blood pressure is above 140/90. °· You have heart disease and your blood pressure is above 140/90. °Your personal target blood pressure may vary depending on your medical conditions, your age, and other factors. °HOME CARE INSTRUCTIONS °· Have your blood pressure rechecked as directed by your health care provider.   °· Take medicines only as directed by your health care provider. Follow the directions carefully. Blood pressure medicines must be taken as prescribed. The medicine does not work as well when you skip doses. Skipping doses also puts you at risk for problems. °· Do not smoke.   °· Monitor your blood pressure at home as directed by your health care provider.  °SEEK MEDICAL CARE IF:  °· You think you are having a reaction to medicines taken. °· You have recurrent headaches or feel dizzy. °· You have swelling in your ankles. °· You have trouble with your vision. °SEEK IMMEDIATE MEDICAL CARE IF: °· You develop a severe headache or confusion. °· You have unusual weakness, numbness, or feel faint. °· You have severe chest or abdominal pain. °· You vomit repeatedly. °· You have trouble breathing. °MAKE SURE YOU:  °· Understand these instructions. °· Will watch your condition. °· Will get help right away if you are not doing well or get worse. °  °This information is not intended to  replace advice given to you by your health care provider. Make sure you discuss any questions you have with your health care provider. °  °Document Released: 08/14/2005 Document Revised: 12/29/2014 Document Reviewed: 06/06/2013 °Elsevier Interactive Patient Education ©2016 Elsevier Inc. ° °

## 2015-12-04 NOTE — ED Notes (Addendum)
Pt to ed with c/o HTN today.  Pt states recently she has struggled with hypotension and had weaned off of her BP meds.  Pt states today she had headache and pressure in head and took bp and found it to be high.  Pt denies chest pain, states current headache is only 3/10.  Pt alert and oriented at this time.

## 2015-12-04 NOTE — ED Notes (Signed)
Patient instructed by MD to continue taking BP medication.  Patient instructed to follow up with PCP as soon as possible and to call Monday for an appointment. Pt aggreeable with plan.

## 2015-12-04 NOTE — ED Notes (Signed)
MD at bedside. 

## 2015-12-04 NOTE — ED Provider Notes (Signed)
[facility] Emergency Department Provider Note  ____________________________________________  Time seen: 1:50 PM  I have reviewed the triage vital signs and the nursing notes.   HISTORY  Chief Complaint Hypertension    HPI Kristen Simon is a 65 y.o. female who complains of severe hypertension at home since last night. She has chronic hypertension but since she stopped smoking about 4 months ago, she's noticed that her blood pressure is been running on the lower side. Due to blood pressures running about 100/60, she was gradually weaned off of all of her antihypertensives. She been off of her amlodipine entirely for the past 2 weeks, when yesterday she noticed that she she was having a headache and her blood pressure was about 220/120. His remained persistently elevated until this morning. She took 2.5 mg of amlodipine at about 10 AM, and since her symptoms did not improve, she took another 2.5 mg of amlodipine at about 11 AM. She then decided to come over to the emergency department for evaluation since she remains hypertensive with a headache. She also reports that this morning around 8 AM she had a brief episode of right-sided chest pain that was nonradiating, not associated with shortness of breath vomiting diaphoresis or exertional symptoms. No dizziness.  She has a history of headaches and her current headache feels similar. It was gradual onset, 7 or 8 out of 10 at its worse but now currently 2 out of 10. No vision Changes or focal weakness.     Past Medical History  Diagnosis Date  . Hypertension 2010  . Personal history of tobacco use, presenting hazards to health   . Hx of radiation therapy 2013  . Internal hemorrhoids with other complication 0000000  . Breast screening, unspecified   . History of cancer chemotherapy 2013  . Unilateral or unspecified femoral hernia with obstruction   . Elevated cholesterol   . Ulcer 1970    stomach-resolved  .  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      Patient Active Problem List   Diagnosis Date Noted  . Benign essential HTN 01/29/2015  . Combined fat and carbohydrate induced hyperlipemia 01/29/2015  . Breathlessness on exertion 01/29/2015  . Cough 01/02/2014  . 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 History of cancer chemotherapy      Past Surgical History  Procedure Laterality Date  . Appendectomy  1970  . Fracture surgery  [id number]    right wrist,right leg  . Groin mass open biopsy Right 01/24/2012    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  . Anoscopy  03/27/2012    anal biopsy/hemorrhoid-benign  . Hernia repair Right 01/10/2012    right femerol hernia     Current Outpatient Rx  Name  Route  Sig  Dispense  Refill  . albuterol (PROVENTIL HFA;VENTOLIN HFA) 108 (90 BASE) MCG/ACT inhaler   Inhalation   Inhale into the lungs every 6 (six) hours as needed for wheezing or shortness of breath.         Marland Kitchen amLODipine (NORVASC) 5 MG tablet   Oral   Take 1 tablet by mouth daily.         Marland Kitchen amoxicillin (AMOXIL) 500 MG tablet   Oral   Take 1 tablet (500 mg total) by mouth 3 (three) times daily.   30 tablet   0   . aspirin 81  MG tablet   Oral   Take 81 mg by mouth daily.         . fluticasone (FLONASE) 50 MCG/ACT nasal spray   Nasal   Place 2 sprays into the nose daily.         Marland Kitchen HYDROcodone-acetaminophen (NORCO) 10-325 MG tablet   Oral   Take 0.5 tablets by mouth 4 (four) times daily as needed.   60 tablet   0   . Hyprom-Naphaz-Polysorb-Zn Sulf (CLEAR EYES COMPLETE OP)   Ophthalmic   Apply 2 drops to eye daily as needed (itching).         . Lactobacillus ([employer]S PROBIOTIC ACIDOPHILUS) 10 MG CAPS   Oral   Take 1 tablet by mouth daily.         . meclizine (ANTIVERT) 25 MG tablet               .  senna-docusate (SENOKOT-S) 8.6-50 MG per tablet   Oral   Take 1 tablet by mouth at bedtime as needed.             Allergies Review of patient's allergies indicates no known allergies.   Family History  Problem Relation Age of Onset  . Cancer Mother     lung    Social History Social History  Substance Use Topics  . Smoking status: Former Smoker -- 0.00 packs/day for 40 years  . Smokeless tobacco: None  . Alcohol Use: No    Review of Systems  Constitutional:   No fever or chills. No weight changes Eyes:   No vision changes.  ENT:   No sore throat. No rhinorrhea. Cardiovascular:   Positive brief as above aching chest pain on the right side. Respiratory:   No dyspnea or cough. Gastrointestinal:  Chronic right lower quadrant abdominal pain without, vomiting and diarrhea.  No BRBPR or melena. Genitourinary:   Negative for dysuria or difficulty urinating. Musculoskeletal:   Negative for focal pain or swelling Skin:   Negative for rash. Neurological:   Positive headache without focal weakness or paresthesia.Marland Kitchen  10-point ROS otherwise negative.  ____________________________________________   PHYSICAL EXAM:  VITAL SIGNS: ED Triage Vitals  Enc Vitals Group     BP 12/04/15 1304 191/73 mmHg     Pulse Rate 12/04/15 1304 86     Resp 12/04/15 1304 18     Temp 12/04/15 1304 98.4 F (36.9 C)     Temp Source 12/04/15 1304 Oral     SpO2 12/04/15 1304 97 %     Weight 12/04/15 1304 149 lb (67.586 kg)     Height 12/04/15 1304 5\' 2"  (1.575 m)     Head Cir --      Peak Flow --      Pain Score 12/04/15 1305 3     Pain Loc --      Pain Edu? --      Excl. in [place]? --     Vital signs reviewed, nursing assessments reviewed.   Constitutional:   Alert and oriented. Well appearing and in no distress. Eyes:   No scleral icterus. No conjunctival pallor. PERRL. EOMI ENT   Head:   Normocephalic and atraumatic.   Nose:   No congestion/rhinnorhea. No septal hematoma    Mouth/Throat:   MMM, no pharyngeal erythema. No peritonsillar mass.    Neck:   No stridor. No SubQ emphysema. No meningismus. Hematological/Lymphatic/Immunilogical:   No cervical lymphadenopathy. Cardiovascular:   RRR. Symmetric bilateral radial and DP pulses.  No murmurs.  Respiratory:   Normal respiratory effort without tachypnea nor retractions. Breath sounds are clear and equal bilaterally. No wheezes/rales/rhonchi. Gastrointestinal:   Soft with right lower quadrant tenderness. Patient states that this is baseline due to multiple previous oncologic surgeries in the area. Also is status post appendectomy. Non distended. There is no CVA tenderness.  No rebound, rigidity, or guarding. Genitourinary:   deferred Musculoskeletal:   Nontender with normal range of motion in all extremities. No joint effusions.  No lower extremity tenderness.  No edema. Neurologic:   Normal speech and language.  CN 2-10 normal. Motor grossly intact. No gross focal neurologic deficits are appreciated.  Skin:    Skin is warm, dry and intact. No rash noted.  No petechiae, purpura, or bullae. Psychiatric:   Mood and affect are normal. ____________________________________________    LABS (pertinent positives/negatives) (all labs ordered are listed, but only abnormal results are displayed) Labs Reviewed  BASIC METABOLIC PANEL - Abnormal; Notable for the following:    Glucose, Bld 110 (*)    All other components within normal limits  CBC WITH DIFFERENTIAL/PLATELET  TROPONIN I   ____________________________________________   EKG  Interpreted by me Normal sinus rhythm rate of 75, normal axis intervals QRS ST segments and T waves  ____________________________________________    RADIOLOGY  CT head unremarkable  ____________________________________________   PROCEDURES   ____________________________________________   INITIAL IMPRESSION / ASSESSMENT AND PLAN / ED COURSE  Pertinent labs & imaging  results that were available during my care of the patient were reviewed by me and considered in my medical decision making (see chart for details).  Patient presents with uncontrolled hypertension. Her headache is consistent with previous pattern and an concerning for stroke intracranial hemorrhage meningitis encephalitis intercranial hypertension and glaucoma. However, due to the elevated pressures, we'll check a CT head for further risk stratification. Additionally because she had a brief episode of chest pain, although it is not consistent with ACS will check troponins.  ----------------------------------------- 3:34 PM on 12/04/2015 -----------------------------------------  Blood pressure improved. Currently 160/70. Vital signs unremarkable. Patient still well-appearing with symptoms relatively controlled. We'll discharge home, advised to restart her amlodipine at 2.5 mg daily and follow up closely with primary care.    ____________________________________________   FINAL CLINICAL IMPRESSION(S) / ED DIAGNOSES  Final diagnoses:  Uncontrolled hypertension      Carrie Mew, MD 12/04/15 1534

## 2015-12-08 ENCOUNTER — Other Ambulatory Visit: Payer: Self-pay | Admitting: Obstetrics and Gynecology

## 2015-12-08 ENCOUNTER — Inpatient Hospital Stay: Payer: BLUE CROSS/BLUE SHIELD | Attending: Obstetrics and Gynecology | Admitting: Obstetrics and Gynecology

## 2015-12-08 ENCOUNTER — Encounter: Payer: Self-pay | Admitting: Obstetrics and Gynecology

## 2015-12-08 VITALS — BP 140/91 | HR 80 | Temp 96.6°F | Resp 20 | Wt 151.7 lb

## 2015-12-08 DIAGNOSIS — Z7982 Long term (current) use of aspirin: Secondary | ICD-10-CM | POA: Diagnosis not present

## 2015-12-08 DIAGNOSIS — R609 Edema, unspecified: Secondary | ICD-10-CM

## 2015-12-08 DIAGNOSIS — Z923 Personal history of irradiation: Secondary | ICD-10-CM | POA: Diagnosis not present

## 2015-12-08 DIAGNOSIS — M549 Dorsalgia, unspecified: Secondary | ICD-10-CM | POA: Insufficient documentation

## 2015-12-08 DIAGNOSIS — Z87891 Personal history of nicotine dependence: Secondary | ICD-10-CM | POA: Insufficient documentation

## 2015-12-08 DIAGNOSIS — Z9221 Personal history of antineoplastic chemotherapy: Secondary | ICD-10-CM | POA: Insufficient documentation

## 2015-12-08 DIAGNOSIS — Z859 Personal history of malignant neoplasm, unspecified: Secondary | ICD-10-CM | POA: Insufficient documentation

## 2015-12-08 DIAGNOSIS — R229 Localized swelling, mass and lump, unspecified: Secondary | ICD-10-CM | POA: Insufficient documentation

## 2015-12-08 DIAGNOSIS — E7801 Familial hypercholesterolemia: Secondary | ICD-10-CM | POA: Diagnosis not present

## 2015-12-08 DIAGNOSIS — K5900 Constipation, unspecified: Secondary | ICD-10-CM | POA: Insufficient documentation

## 2015-12-08 DIAGNOSIS — I10 Essential (primary) hypertension: Secondary | ICD-10-CM | POA: Diagnosis not present

## 2015-12-08 DIAGNOSIS — C531 Malignant neoplasm of exocervix: Secondary | ICD-10-CM

## 2015-12-08 DIAGNOSIS — Z79899 Other long term (current) drug therapy: Secondary | ICD-10-CM

## 2015-12-08 DIAGNOSIS — R102 Pelvic and perineal pain: Secondary | ICD-10-CM | POA: Diagnosis not present

## 2015-12-08 DIAGNOSIS — R630 Anorexia: Secondary | ICD-10-CM | POA: Insufficient documentation

## 2015-12-08 DIAGNOSIS — R1031 Right lower quadrant pain: Secondary | ICD-10-CM | POA: Diagnosis not present

## 2015-12-08 NOTE — Progress Notes (Signed)
  Oncology Nurse Navigator Documentation  Navigator Location: [facility] (12/08/15 0900) Navigator Encounter Type: [facility] Follow-up (12/08/15 0900)           Patient Visit Type: Follow-up (12/08/15 0900) Treatment Phase: Follow-up (12/08/15 0900)                            Time Spent with Patient: 15 (12/08/15 0900)   Chaperoned pelvic exam. Pap smear collected.

## 2015-12-08 NOTE — Progress Notes (Signed)
Gynecologic Oncology Interval Visit   Referring Provider: Referred by Dr. Oliva Bustard.   Chief Concern: Continued surveillance for squamous carcinoma noted in a R inguinal node, presumed cervical cancer  Subjective:  Kristen Simon is a 65 y.o. female who is seen for continued surveillance for squamous carcinoma noted in a R inguinal node, presumed cervical cancer.    She saw Dr. Oliva Bustard October 2016 for a follow-up and had a negative exam including breast exam. Her mammogram was negative on CT scan on 07/02/2015.   She presents today with complaints of persistent right lower quadrant abdominal pain. This has been chronic since treatment and not worsened.  She also complaints of right groin discomfort that has able been long-standing.   She has had other medical issues prompting a visit to the ER and noted to be hypertensive.   Overall she continues to do well. She has intermittent symptoms that are noted in the review of symptoms below. She recently stopped smoking.   Gynecologic Oncology  History Kristen Simon is a very pleasant patient with a history of squamous carcinoma noted in a R inguinal node, presumed metastatic cervical cancer.   12/2011        squamous carcinoma noted in a R inguinal node,CT /  Pet scan without definite primary,              anal exam WNL, pelvic exam normal, but incomplete due to discomfort, ECC neg,   tissue evaluation c/w/ cervical primary,                   chemo-XRT, completed NED 05/2012  Dr. Sabra Heck in August 2015 negative exam and negative Pap smear.   Dr. Oliva Bustard March 2016 for a follow-up CT scan that was negative for evidence of recurrent disease.  10/21/2014 CT scan Abdomen and pelvis IMPRESSION: 1. No evidence of recurrent or metastatic disease. 2. Question mild hepatic steatosis. 3. Suspect a 2.5 cm uterine fibroid.    Problem List: Patient Active Problem List   Diagnosis Date Noted  . Benign essential HTN 01/29/2015  . Combined fat and  carbohydrate induced hyperlipemia 01/29/2015  . Breathlessness on exertion 01/29/2015  . Cough 01/02/2014  . 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 History of cancer chemotherapy     Past Medical History: Past Medical History  Diagnosis Date  . Hypertension 2010  . Personal history of tobacco use, presenting hazards to health   . Hx of radiation therapy 2013  . Internal hemorrhoids with other complication 0000000  . Breast screening, unspecified   . History of cancer chemotherapy 2013  . Unilateral or unspecified femoral hernia with obstruction   . Elevated cholesterol   . Ulcer 1970    stomach-resolved  . 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     Past Surgical History: Past Surgical History  Procedure Laterality Date  . Appendectomy  1970  . Fracture surgery  [id number]    right wrist,right leg  . Groin mass open biopsy Right 01/24/2012    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  . Anoscopy  03/27/2012    anal biopsy/hemorrhoid-benign  . Hernia repair Right 01/10/2012    right femerol hernia    Past Gynecologic History:   Gravida 1   Para 1   Age at Menarche 47   Age at Menopause 57   Regular Pap Smears  Yes   Additional Hx patient does not recall any gyn problems in the past,    OB History:  OB History  Obstetric Comments  Age with first menstruation-9  LMP-age 80    Family History: Family History  Problem Relation Age of Onset  . Cancer Mother     lung    Social History: Social History   Social History  . Marital Status: Married    Spouse Name: N/A  . Number of Children: N/A  . Years of Education: N/A   Occupational History  . Not on file.   Social History Main Topics  . Smoking status: Former Smoker -- 0.00 packs/day for 40 years  . Smokeless tobacco: Not on file  . Alcohol Use: No  .  Drug Use: No  . Sexual Activity: No   Other Topics Concern  . Not on file   Social History Narrative    Allergies: No Known Allergies  Current Medications: Current Outpatient Prescriptions  Medication Sig Dispense Refill  . albuterol (PROVENTIL HFA;VENTOLIN HFA) 108 (90 BASE) MCG/ACT inhaler Inhale into the lungs every 6 (six) hours as needed for wheezing or shortness of breath.    Marland Kitchen amLODipine (NORVASC) 5 MG tablet Take 1 tablet by mouth daily.    Marland Kitchen amoxicillin (AMOXIL) 500 MG tablet Take 1 tablet (500 mg total) by mouth 3 (three) times daily. 30 tablet 0  . aspirin 81 MG tablet Take 81 mg by mouth daily.    . fluticasone (FLONASE) 50 MCG/ACT nasal spray Place 2 sprays into the nose daily.    Marland Kitchen HYDROcodone-acetaminophen (NORCO) 10-325 MG tablet Take 0.5 tablets by mouth 4 (four) times daily as needed. 60 tablet 0  . Hyprom-Naphaz-Polysorb-Zn Sulf (CLEAR EYES COMPLETE OP) Apply 2 drops to eye daily as needed (itching).    . Lactobacillus ([employer]S PROBIOTIC ACIDOPHILUS) 10 MG CAPS Take 1 tablet by mouth daily.    . meclizine (ANTIVERT) 25 MG tablet     . senna-docusate (SENOKOT-S) 8.6-50 MG per tablet Take 1 tablet by mouth at bedtime as needed.      No current facility-administered medications for this visit.    Review of Systems General: fatigue Pulmonary: dyspnea Cardiac: negative Gastrointestinal: positive for abdominal pain, constipation and poor appetite, negative for, nausea, vomiting, diarrhea.  Abdominal pain located on the right and is occasional sharp pain Genitourinary/Sexual: negative Ob/Gyn: negative for, irregular bleeding, pain Musculoskeletal: positive for back pain, right groin pain Neurologic/Psych: positive for paresthesia   Re: bilateral feet swelling, seasonal allergies  Objective:  Physical Examination:  BP 140/91 mmHg  Pulse 80  Temp(Src) 96.6 F (35.9 C) (Tympanic)  Resp 20  Wt 151 lb 10.8 oz (68.8 kg)   Body mass index is 27.73 kg/(m^2).     ECOG Performance Status: 0 - Asymptomatic  General appearance: alert, cooperative and appears stated age HEENT:PERRLA, extra ocular movement intact and sclera clear, anicteric Lymph node survey: non-palpable, axillary, inguinal, supraclavicular. The right groin is tender but there are no palpable masses. Cardiovascular: regular rate and rhythm Respiratory: normal air entry, lungs clear to auscultation Abdomen: soft, non-tender, without intraabdominal masses or organomegaly, no hernias and well healed incision. She does have localized subcutaneous tenderness and thickening vs subcutaneous mass in the right lower quadrant. This area was superficial and did not seem to represent an intraabdominal process.  Back: inspection of back is normal Extremities: extremities normal, atraumatic, no cyanosis or edema Neurological exam reveals alert, oriented, normal speech, no focal findings or movement disorder  noted.  Pelvic: exam chaperoned by nurse;  Vulva: normal appearing vulva with no masses, tenderness or lesions; Vagina: normal vagina on BME she has a small 1 cm nodule to the right of midline that is tender; Adnexa: normal adnexa in size, nontender and no masses; Uterus: uterus is normal size, shape, consistency and nontender; Cervix: no lesions appears stenotic os; Rectal: normal rectal, no masses    Lab Review Labs on site today: n/a  Radiologic Imaging: As noted above    Assessment:  Kristen Simon is a 65 y.o. female diagnosed with  squamous carcinoma noted in a R inguinal node, presumed cervical cancer s/p  Chemotherapy and radiation, clinically NED. Symptoms of right abdominal and groin pain. Exam concerning for subcutaneous lesion.   Medical co-morbidities complicating care: history of tobacco use, now stopped. Overweight.  Plan:   Given her symptoms and exam findings I have ordered a CT scan of the abdomen and pelvis for further evaluation. WE also obtained a Pap today.   If  negative we will continue close follow up with exams, including pelvic exams every 6 months until 5 years and then annually thereafter with her PCP or gynecologist. Per the patient her PCP, Dr. Maryland Pink has a nurse practitioner that can follow up on the pelvic exams. Cervical/vaginal cytology may be considered annually. It is thought that the Pap smears have limited value for detecting recurrent cervical cancer, but we did obtain one today. Imaging and laboratory assessment is based on clinical indication.   She was congratulated on smoking cessation. We discussed her BMI and stressed the need for good nutrition and exercise.   She will follow up with Dr. Kary Kos for her other medical needs. I asked her to check with Dr. Kary Kos to see if he is comfortable performing her breast cancer screening.   Suggested return to clinic in  6 months.    The patient's diagnosis, an outline of the further diagnostic and laboratory studies which will be required, the recommendation, and alternatives were discussed.  All questions were answered to the patient's satisfaction.   Gillis Ends, MD    CC:  Dr. Maryland Pink

## 2015-12-08 NOTE — Progress Notes (Signed)
Patient here today for 6 month follow up. Patient reports intermittent right groin paint that has continued since diagnosis.

## 2015-12-13 LAB — PAP LB AND HPV HIGH-RISK
HPV, high-risk: NEGATIVE
PAP SMEAR COMMENT: 0

## 2015-12-17 ENCOUNTER — Ambulatory Visit
Admission: RE | Admit: 2015-12-17 | Discharge: 2015-12-17 | Disposition: A | Discharge status: Home / Self Care | Payer: BLUE CROSS/BLUE SHIELD | Source: Ambulatory Visit | Attending: Obstetrics and Gynecology | Admitting: Obstetrics and Gynecology

## 2015-12-17 DIAGNOSIS — C531 Malignant neoplasm of exocervix: Secondary | ICD-10-CM | POA: Diagnosis present

## 2015-12-17 DIAGNOSIS — D259 Leiomyoma of uterus, unspecified: Secondary | ICD-10-CM | POA: Insufficient documentation

## 2015-12-17 DIAGNOSIS — K760 Fatty (change of) liver, not elsewhere classified: Secondary | ICD-10-CM | POA: Insufficient documentation

## 2015-12-17 DIAGNOSIS — R1031 Right lower quadrant pain: Secondary | ICD-10-CM | POA: Diagnosis not present

## 2015-12-17 MED ORDER — IOPAMIDOL (ISOVUE-300) INJECTION 61%
100.0000 mL | Freq: Once | INTRAVENOUS | Status: AC | PRN
  Administered 2015-12-17: 100 mL via INTRAVENOUS

## 2015-12-23 ENCOUNTER — Telehealth: Payer: Self-pay

## 2015-12-23 ENCOUNTER — Inpatient Hospital Stay: Payer: BLUE CROSS/BLUE SHIELD

## 2015-12-23 ENCOUNTER — Inpatient Hospital Stay (HOSPITAL_BASED_OUTPATIENT_CLINIC_OR_DEPARTMENT_OTHER): Payer: BLUE CROSS/BLUE SHIELD | Admitting: Oncology

## 2015-12-23 VITALS — BP 175/84 | HR 90 | Temp 96.3°F | Resp 18 | Wt 150.1 lb

## 2015-12-23 DIAGNOSIS — R102 Pelvic and perineal pain: Secondary | ICD-10-CM | POA: Diagnosis not present

## 2015-12-23 DIAGNOSIS — C774 Secondary and unspecified malignant neoplasm of inguinal and lower limb lymph nodes: Secondary | ICD-10-CM

## 2015-12-23 DIAGNOSIS — R229 Localized swelling, mass and lump, unspecified: Secondary | ICD-10-CM

## 2015-12-23 DIAGNOSIS — R1031 Right lower quadrant pain: Secondary | ICD-10-CM | POA: Diagnosis not present

## 2015-12-23 DIAGNOSIS — Z859 Personal history of malignant neoplasm, unspecified: Secondary | ICD-10-CM | POA: Diagnosis not present

## 2015-12-23 DIAGNOSIS — Z87891 Personal history of nicotine dependence: Secondary | ICD-10-CM

## 2015-12-23 DIAGNOSIS — Z79899 Other long term (current) drug therapy: Secondary | ICD-10-CM

## 2015-12-23 DIAGNOSIS — Z923 Personal history of irradiation: Secondary | ICD-10-CM

## 2015-12-23 DIAGNOSIS — Z9221 Personal history of antineoplastic chemotherapy: Secondary | ICD-10-CM

## 2015-12-23 LAB — COMPREHENSIVE METABOLIC PANEL
ALT: 24 U/L (ref 14–54)
AST: 25 U/L (ref 15–41)
Albumin: 4.4 g/dL (ref 3.5–5.0)
Alkaline Phosphatase: 53 U/L (ref 38–126)
Anion gap: 9 (ref 5–15)
BILIRUBIN TOTAL: 0.5 mg/dL (ref 0.3–1.2)
BUN: 12 mg/dL (ref 6–20)
CALCIUM: 10.3 mg/dL (ref 8.9–10.3)
CO2: 24 mmol/L (ref 22–32)
CREATININE: 0.62 mg/dL (ref 0.44–1.00)
Chloride: 106 mmol/L (ref 101–111)
GFR calc Af Amer: >60 mL/min (ref >60)
Glucose, Bld: 102 mg/dL — ABNORMAL HIGH (ref 65–99)
Potassium: 3.8 mmol/L (ref 3.5–5.1)
Sodium: 139 mmol/L (ref 135–145)
TOTAL PROTEIN: 7.4 g/dL (ref 6.5–8.1)

## 2015-12-23 LAB — CBC WITH DIFFERENTIAL/PLATELET
BASOS ABS: 0 10*3/uL (ref 0–0.1)
BASOS PCT: 1 %
Eosinophils Absolute: 0.1 10*3/uL (ref 0–0.7)
Eosinophils Relative: 2 %
HEMATOCRIT: 39.2 % (ref 35.0–47.0)
Hemoglobin: 13.7 g/dL (ref 12.0–16.0)
LYMPHS PCT: 40 %
Lymphs Abs: 1.7 10*3/uL (ref 1.0–3.6)
MCH: 30.5 pg (ref 26.0–34.0)
MCHC: 34.9 g/dL (ref 32.0–36.0)
MCV: 87.6 fL (ref 80.0–100.0)
Monocytes Absolute: 0.4 10*3/uL (ref 0.2–0.9)
Monocytes Relative: 10 %
NEUTROS ABS: 2 10*3/uL (ref 1.4–6.5)
Neutrophils Relative %: 47 %
PLATELETS: 271 10*3/uL (ref 150–440)
RBC: 4.48 MIL/uL (ref 3.80–5.20)
RDW: 12.6 % (ref 11.5–14.5)
WBC: 4.4 10*3/uL (ref 3.6–11.0)

## 2015-12-23 MED ORDER — SENNOSIDES-DOCUSATE SODIUM 8.6-50 MG PO TABS: 1.0000 | ORAL_TABLET | Freq: Every evening | ORAL | Status: AC | PRN

## 2015-12-23 MED ORDER — HYDROCODONE-ACETAMINOPHEN 10-325 MG PO TABS: 0.5000 | ORAL_TABLET | Freq: Four times a day (QID) | ORAL | Status: DC | PRN

## 2015-12-23 NOTE — Telephone Encounter (Signed)
  Oncology Nurse Navigator Documentation  Navigator Location: [facility] (12/23/15 1400) Navigator Encounter Type: Diagnostic Results (12/23/15 1400)                                          Time Spent with Patient: 15 (12/23/15 1400)   Attempted to call Ms Kristen Simon to give her results of Pap smear and Ct abdomen. Answering machine full. Unable to leave voicemail.

## 2015-12-23 NOTE — Progress Notes (Signed)
  Oncology Nurse Navigator Documentation  Navigator Location: [facility] (12/23/15 1500) Navigator Encounter Type: Diagnostic Results (12/23/15 1500)                                          Time Spent with Patient: 15 (12/23/15 1500)   She is in clinic to see Dr Oliva Bustard. Results of CT and pap given to her.

## 2015-12-23 NOTE — Progress Notes (Signed)
Patient states she had to come to ED several weeks ago because she decided to stop her BP.  After a few days BP elevated.  Medication has been adjusted and she is feeling much better.  Saw Dr. Theora Gianotti on 12-16-15 and had CT scan for suspected problem on right ovary.  Patient would like to have MD go over results today.  Also requesting refill for Senokot and Norco.

## 2015-12-24 MED ORDER — HEPARIN SOD (PORK) LOCK FLUSH 100 UNIT/ML IV SOLN
INTRAVENOUS | Status: AC
  Filled 2015-12-24: qty 5

## 2015-12-25 ENCOUNTER — Encounter: Payer: Self-pay | Admitting: Oncology

## 2015-12-25 NOTE — Progress Notes (Signed)
[place] @ [facility] Telephone:([phone]  Fax:(336) [place]: 1950-11-26  MR#: [id number]  QMG#:[id number]  Patient Care Team: Maryland Pink, MD as PCP - General (Family Medicine) Clent Jacks, RN as Registered Nurse  CHIEF COMPLAINT:  Chief Complaint  Patient presents with  . Swcondary malignant neoplasm of inguinal lymph nodes   Chief Complaint/Diagnosis:   12/2011  squamous carcinoma noted in a R inguinal node,CT /  Pet scan without definite primary, tissue evaluation c/w/ cervical primary, chemo-XRT, completed NED 05/2012 HPI:   65 year old lady who has developed pain in the right inguinal area.  Senna is undergone evaluation with CT scan.  According to patient after taking laxative and having bowel movement pain has improved        Patient states she had to come to ED several weeks ago because she decided to stop her BP. After a few days BP elevated. Medication has been adjusted and she is feeling much better. Saw Dr. Theora Gianotti on 12-16-15 and had CT scan for suspected problem on right ovary. Patient would like to have MD go over results today. Also requesting refill for Senokot and Norco.     Patient is here for ongoing evaluation and treatment consideration.  Continues to have right inguinal pain.  Pain is controlled with present approach.  Patient has been evaluated by Dr. Theora Gianotti, GYN oncologist and a CT scan was done.  Here for further follow-up and treatment consideration Patient recently has stopped smoking and according to her her blood pressure went up.  INTERVAL HISTORY: 65 year old patient with squamous cell carcinoma in writing wine a lymph node status post resection and radiation and chemotherapy.  Since treatment patient continues to have pain in the right pelvic area.  Previous evaluation refill possibility of radiation-induced bone necrosis.  Patient is getting physiotherapy taking at least the 3 pills a Vicodin every day.  Patient has  refused in the past pain clinic appointment.  Continues to feel weak and tired  Here for further follow-up and treatment consideration REVIEW OF SYSTEMS:    general status: Patient is feeling weak and tired.  No change in a performance status.  No chills.  No fever. HEENT: Alopecia.  No evidence of stomatitis According to patient's had a sinus infection was treated with amoxicillin patient had dizziness at that time patient responded to antibiotics.  Recently started dizziness again. Lungs: No cough or shortness of breath Cardiac: No chest pain or paroxysmal nocturnal dyspnea GI: No nausea no vomiting no diarrhea no abdominal pain Skin: No rash Lower extremity no swelling Neurological system: continues to have pain in the right pelvic area Taking 2 or 3 tablets of Vicodin daily Musculoskeletal system no bony pains  As per HPI. Otherwise, a complete review of systems is negatve.  PAST MEDICAL HISTORY: Past Medical History  Diagnosis Date  . Hypertension 2010  . Personal history of tobacco use, presenting hazards to health   . Hx of radiation therapy 2013  . Internal hemorrhoids with other complication 3267  . Breast screening, unspecified   . History of cancer chemotherapy 2013  . Unilateral or unspecified femoral hernia with obstruction   . Elevated cholesterol   . Ulcer 1970    stomach-resolved  . 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     lymphoma 2013    PAST SURGICAL HISTORY: Past Surgical History  Procedure Laterality Date  . Appendectomy  1970  .  Fracture surgery  1219,7588    right wrist,right leg  . Groin mass open biopsy Right 01/24/2012    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  . Anoscopy  03/27/2012    anal biopsy/hemorrhoid-benign  . Hernia repair Right 01/10/2012    right femerol hernia    FAMILY HISTORY Family History  Problem Relation Age of Onset  . Cancer  Mother     lung   Preventive Screening:  Has patient had any of the following test? Colonscopy  Mammography  Pap Smear (1)   Last Colonoscopy: in 1980's done in [place](1)   Last Mammography: 2013(1)   Last Pap Smear: Dr Wallene Huh and Dr. Sabra Heck [phone])   Smoking History: Smoking History smoked for 40 years , continues to smoke  PFSH: Comments: Family history of lung cancer.No family history of colorectal cancer, breast cancer, or ovarian cancer.  Comments: Patient has smoked one pack per day for 40 years.  Quit smoking approximately few weeks ago.  Does not drink  Additional Past Medical and Surgical History: No significant past medical history   ADVANCED DIRECTIVES:  Patient does have advance healthcare directive, Patient   does not desire to make any changes HEALTH MAINTENANCE: Social History  Substance Use Topics  . Smoking status: Former Smoker -- 0.00 packs/day for 40 years  . Smokeless tobacco: None  . Alcohol Use: No      No Known Allergies  Current Outpatient Prescriptions  Medication Sig Dispense Refill  . albuterol (PROVENTIL HFA;VENTOLIN HFA) 108 (90 BASE) MCG/ACT inhaler Inhale into the lungs every 6 (six) hours as needed for wheezing or shortness of breath.    Marland Kitchen amLODipine (NORVASC) 2.5 MG tablet Take 2.5 mg by mouth daily.    Marland Kitchen aspirin 81 MG tablet Take 81 mg by mouth daily.    . clotrimazole-betamethasone (LOTRISONE) cream Apply topically.    . fluticasone (FLONASE) 50 MCG/ACT nasal spray Place 2 sprays into the nose daily.    Marland Kitchen HYDROcodone-acetaminophen (NORCO) 10-325 MG tablet Take 0.5 tablets by mouth 4 (four) times daily as needed. 60 tablet 0  . Hyprom-Naphaz-Polysorb-Zn Sulf (CLEAR EYES COMPLETE OP) Apply 2 drops to eye daily as needed (itching).    . Lactobacillus (CVS PROBIOTIC ACIDOPHILUS) 10 MG CAPS Take 1 tablet by mouth daily.    Marland Kitchen loratadine (CLARITIN REDITABS) 10 MG dissolvable tablet Take 10 mg by mouth daily.    Marland Kitchen senna-docusate (SENOKOT-S) 8.6-50  MG tablet Take 1 tablet by mouth at bedtime as needed. 30 tablet 6   No current facility-administered medications for this visit.    OBJECTIVE:  Filed Vitals:   12/23/15 1454  BP: 175/84  Pulse: 90  Temp: 96.3 F (35.7 C)  Resp: 18     Body mass index is 27.45 kg/(m^2).    ECOG FS:1 - Symptomatic but completely ambulatory  PHYSICAL EXAM: GENERAL:  Well developed, well nourished, sitting comfortably in the exam room in no acute distress. MENTAL STATUS:  Alert and oriented to person, place and time.  ENT:  Oropharynx clear without lesion.  Tongue normal. Mucous membranes moist.  RESPIRATORY:  Clear to auscultation without rales, wheezes or rhonchi. CARDIOVASCULAR:  Regular rate and rhythm without murmur, rub or gallop. BREAST:  Right breast without masses, skin changes or nipple discharge.  Left breast without masses, skin changes or nipple discharge. ABDOMEN:  Soft, non-tender, with active bowel sounds, and no hepatosplenomegaly.  No masses. BACK:  No CVA tenderness.  No tenderness on percussion of the  back or rib cage. SKIN:  No rashes, ulcers or lesions. EXTREMITIES: No edema, no skin discoloration or tenderness.  No palpable cords. LYMPH NODES: No palpable cervical, supraclavicular, axillary or inguinal adenopathy  NEUROLOGICAL: Unremarkable. PSYCH:  Appropriate.   LAB RESULTS:  CBC Latest Ref Rng 12/23/2015 12/04/2015  WBC 3.6 - 11.0 K/uL 4.4 3.9  Hemoglobin 12.0 - 16.0 g/dL 13.7 12.8  Hematocrit 35.0 - 47.0 % 39.2 36.4  Platelets 150 - 440 K/uL 271 236    Appointment on 12/23/2015  Component Date Value Ref Range Status  . WBC 12/23/2015 4.4  3.6 - 11.0 K/uL Final  . RBC 12/23/2015 4.48  3.80 - 5.20 MIL/uL Final  . Hemoglobin 12/23/2015 13.7  12.0 - 16.0 g/dL Final  . HCT 12/23/2015 39.2  35.0 - 47.0 % Final  . MCV 12/23/2015 87.6  80.0 - 100.0 fL Final  . MCH 12/23/2015 30.5  26.0 - 34.0 pg Final  . MCHC 12/23/2015 34.9  32.0 - 36.0 g/dL Final  . RDW 12/23/2015  12.6  11.5 - 14.5 % Final  . Platelets 12/23/2015 271  150 - 440 K/uL Final  . Neutrophils Relative % 12/23/2015 47   Final  . Neutro Abs 12/23/2015 2.0  1.4 - 6.5 K/uL Final  . Lymphocytes Relative 12/23/2015 40   Final  . Lymphs Abs 12/23/2015 1.7  1.0 - 3.6 K/uL Final  . Monocytes Relative 12/23/2015 10   Final  . Monocytes Absolute 12/23/2015 0.4  0.2 - 0.9 K/uL Final  . Eosinophils Relative 12/23/2015 2   Final  . Eosinophils Absolute 12/23/2015 0.1  0 - 0.7 K/uL Final  . Basophils Relative 12/23/2015 1   Final  . Basophils Absolute 12/23/2015 0.0  0 - 0.1 K/uL Final  . Sodium 12/23/2015 139  135 - 145 mmol/L Final  . Potassium 12/23/2015 3.8  3.5 - 5.1 mmol/L Final  . Chloride 12/23/2015 106  101 - 111 mmol/L Final  . CO2 12/23/2015 24  22 - 32 mmol/L Final  . Glucose, Bld 12/23/2015 102* 65 - 99 mg/dL Final  . BUN 12/23/2015 12  6 - 20 mg/dL Final  . Creatinine, Ser 12/23/2015 0.62  0.44 - 1.00 mg/dL Final  . Calcium 12/23/2015 10.3  8.9 - 10.3 mg/dL Final  . Total Protein 12/23/2015 7.4  6.5 - 8.1 g/dL Final  . Albumin 12/23/2015 4.4  3.5 - 5.0 g/dL Final  . AST 12/23/2015 25  15 - 41 U/L Final  . ALT 12/23/2015 24  14 - 54 U/L Final  . Alkaline Phosphatase 12/23/2015 53  38 - 126 U/L Final  . Total Bilirubin 12/23/2015 0.5  0.3 - 1.2 mg/dL Final  . GFR calc non Af Amer 12/23/2015 >60  >60 mL/min Final  . GFR calc Af Amer 12/23/2015 >60  >60 mL/min Final   Comment: (NOTE) The eGFR has been calculated using the CKD EPI equation. This calculation has not been validated in all clinical situations. eGFR's persistently <60 mL/min signify possible Chronic Kidney Disease.   . Anion gap 12/23/2015 9  5 - 15 Final    ASSESSMENT: Right pelvic pain continues chronic. Continue Vicodin again offered pain clinic appointment   CT scan of abdomen is been reviewed.  There is no evidence of metastatic disease This and had been to emergency room because of headache and non-enhanced CT  scan was negative.  Both CT scan and reviewed independently reviewed with the patient.  At present time there is no evidence of recurrent  disease   2.  Chronic pain syndrome Exact etiology is not clear but may be related to necrosis of bone secondary to radiation therapy Patient expressed understanding and was in agreement with this plan. She also understands that She can call clinic at any time with any questions, concerCs, or complaints.   BE cause of my planned retirement patient will be followed by my associate.  Patient is fully aware of that. No matching staging information was found for the patient.  Forest Gleason, MD   12/25/2015 1:24 PM

## 2015-12-27 ENCOUNTER — Telehealth: Payer: Self-pay | Admitting: *Deleted

## 2015-12-27 NOTE — Telephone Encounter (Signed)
Received referral for low dose lung cancer screening CT scan. Attempted to leave voicemail at phone number listed in EMR for patient to call me back to facilitate scheduling scan. However, voicemail is full at this time. Marland Kitchen

## 2016-01-03 ENCOUNTER — Telehealth: Payer: Self-pay | Admitting: *Deleted

## 2016-01-03 NOTE — Telephone Encounter (Signed)
Received referral for initial lung cancer screening scan. Contacted patient and obtained smoking history as well as answering questions related to screening process. Patient is tentatively scheduled for shared decision making visit and CT scan on 01/14/16, pending insurance approval from business office.

## 2016-01-13 ENCOUNTER — Encounter: Payer: Self-pay | Admitting: Family Medicine

## 2016-01-13 ENCOUNTER — Other Ambulatory Visit: Payer: Self-pay | Admitting: Family Medicine

## 2016-01-13 DIAGNOSIS — Z87891 Personal history of nicotine dependence: Secondary | ICD-10-CM | POA: Insufficient documentation

## 2016-01-14 ENCOUNTER — Inpatient Hospital Stay: Payer: BLUE CROSS/BLUE SHIELD | Attending: Family Medicine | Admitting: Family Medicine

## 2016-01-14 ENCOUNTER — Encounter: Payer: Self-pay | Admitting: Family Medicine

## 2016-01-14 ENCOUNTER — Ambulatory Visit
Admission: RE | Admit: 2016-01-14 | Discharge: 2016-01-14 | Disposition: A | Discharge status: Home / Self Care | Payer: BLUE CROSS/BLUE SHIELD | Source: Ambulatory Visit | Attending: Family Medicine | Admitting: Family Medicine

## 2016-01-14 DIAGNOSIS — Z122 Encounter for screening for malignant neoplasm of respiratory organs: Secondary | ICD-10-CM | POA: Diagnosis not present

## 2016-01-14 DIAGNOSIS — K760 Fatty (change of) liver, not elsewhere classified: Secondary | ICD-10-CM | POA: Insufficient documentation

## 2016-01-14 DIAGNOSIS — Z87891 Personal history of nicotine dependence: Secondary | ICD-10-CM | POA: Insufficient documentation

## 2016-01-14 DIAGNOSIS — J439 Emphysema, unspecified: Secondary | ICD-10-CM | POA: Diagnosis not present

## 2016-01-14 NOTE — Progress Notes (Signed)
In accordance with CMS guidelines, patient has meet eligibility criteria including age, absence of signs or symptoms of lung cancer, the specific calculation of cigarette smoking pack-years was 35 years and is a previous smoker, having quit in 2016.   A shared decision-making session was conducted prior to the performance of CT scan. This includes one or more decision aids, includes benefits and harms of screening, follow-up diagnostic testing, over-diagnosis, false positive rate, and total radiation exposure.  Counseling on the importance of adherence to annual lung cancer LDCT screening, impact of co-morbidities, and ability or willingness to undergo diagnosis and treatment is imperative for compliance of the program.  Counseling on the importance of continued smoking cessation for former smokers; the importance of smoking cessation for current smokers and information about tobacco cessation interventions have been given to patient including the [place] at [facility] 1800 quit , as well as [place] specific smoking cessation programs.  Written order for lung cancer screening with LDCT has been given to the patient and any and all questions have been answered to the best of my abilities.   Yearly follow up will be scheduled by Burgess Estelle, Thoracic Navigator.

## 2016-01-18 ENCOUNTER — Telehealth: Payer: Self-pay | Admitting: *Deleted

## 2016-01-18 NOTE — Telephone Encounter (Signed)
Notified patient of LDCT lung cancer screening results with recommendation for 6 month follow up imaging. Also notified of incidental finding noted below. Patient verbalizes understanding.   IMPRESSION: 1. Lung-Rads category 3, probably benign findings. Short-term follow-up in 6 months is recommended with repeat low-dose chest CT without contrast (please use the following order, "CT CHEST LCS NODULE FOLLOW-UP W/O CM"). 2. Mild diffuse bronchial wall thickening with mild centrilobular and paraseptal emphysema; imaging findings suggestive of underlying COPD. 3. Hepatic steatosis.

## 2016-01-21 ENCOUNTER — Other Ambulatory Visit: Payer: Self-pay | Admitting: *Deleted

## 2016-01-21 DIAGNOSIS — C774 Secondary and unspecified malignant neoplasm of inguinal and lower limb lymph nodes: Secondary | ICD-10-CM

## 2016-01-21 MED ORDER — HYDROCODONE-ACETAMINOPHEN 10-325 MG PO TABS: 0.5000 | ORAL_TABLET | Freq: Four times a day (QID) | ORAL | Status: DC | PRN

## 2016-02-21 ENCOUNTER — Other Ambulatory Visit: Payer: Self-pay | Admitting: *Deleted

## 2016-02-21 DIAGNOSIS — C774 Secondary and unspecified malignant neoplasm of inguinal and lower limb lymph nodes: Secondary | ICD-10-CM

## 2016-02-21 MED ORDER — HYDROCODONE-ACETAMINOPHEN 10-325 MG PO TABS: 0.5000 | ORAL_TABLET | Freq: Four times a day (QID) | ORAL | Status: DC | PRN

## 2016-03-22 ENCOUNTER — Other Ambulatory Visit: Payer: Self-pay | Admitting: *Deleted

## 2016-03-22 DIAGNOSIS — C774 Secondary and unspecified malignant neoplasm of inguinal and lower limb lymph nodes: Secondary | ICD-10-CM

## 2016-03-22 MED ORDER — HYDROCODONE-ACETAMINOPHEN 10-325 MG PO TABS: 0.5000 | ORAL_TABLET | Freq: Four times a day (QID) | ORAL | 0 refills | Status: DC | PRN

## 2016-04-20 ENCOUNTER — Other Ambulatory Visit: Payer: Self-pay | Admitting: *Deleted

## 2016-04-20 DIAGNOSIS — C774 Secondary and unspecified malignant neoplasm of inguinal and lower limb lymph nodes: Secondary | ICD-10-CM

## 2016-04-20 MED ORDER — HYDROCODONE-ACETAMINOPHEN 10-325 MG PO TABS: 0.5000 | ORAL_TABLET | Freq: Four times a day (QID) | ORAL | 0 refills | Status: DC | PRN

## 2016-05-19 ENCOUNTER — Other Ambulatory Visit: Payer: Self-pay | Admitting: *Deleted

## 2016-05-19 DIAGNOSIS — C774 Secondary and unspecified malignant neoplasm of inguinal and lower limb lymph nodes: Secondary | ICD-10-CM

## 2016-05-19 MED ORDER — HYDROCODONE-ACETAMINOPHEN 10-325 MG PO TABS: 0.5000 | ORAL_TABLET | Freq: Four times a day (QID) | ORAL | 0 refills | Status: DC | PRN

## 2016-06-07 ENCOUNTER — Encounter: Payer: Self-pay | Admitting: Obstetrics and Gynecology

## 2016-06-07 ENCOUNTER — Inpatient Hospital Stay: Payer: BLUE CROSS/BLUE SHIELD | Attending: Obstetrics and Gynecology | Admitting: Obstetrics and Gynecology

## 2016-06-07 VITALS — BP 150/81 | HR 88 | Temp 98.1°F | Ht 62.0 in | Wt 161.8 lb

## 2016-06-07 DIAGNOSIS — Z87891 Personal history of nicotine dependence: Secondary | ICD-10-CM | POA: Diagnosis not present

## 2016-06-07 DIAGNOSIS — Z7982 Long term (current) use of aspirin: Secondary | ICD-10-CM | POA: Diagnosis not present

## 2016-06-07 DIAGNOSIS — Z6829 Body mass index (BMI) 29.0-29.9, adult: Secondary | ICD-10-CM | POA: Diagnosis not present

## 2016-06-07 DIAGNOSIS — Z79899 Other long term (current) drug therapy: Secondary | ICD-10-CM | POA: Diagnosis not present

## 2016-06-07 DIAGNOSIS — E7801 Familial hypercholesterolemia: Secondary | ICD-10-CM | POA: Insufficient documentation

## 2016-06-07 DIAGNOSIS — Z8579 Personal history of other malignant neoplasms of lymphoid, hematopoietic and related tissues: Secondary | ICD-10-CM | POA: Insufficient documentation

## 2016-06-07 DIAGNOSIS — I10 Essential (primary) hypertension: Secondary | ICD-10-CM | POA: Diagnosis not present

## 2016-06-07 DIAGNOSIS — Z923 Personal history of irradiation: Secondary | ICD-10-CM | POA: Insufficient documentation

## 2016-06-07 DIAGNOSIS — K5900 Constipation, unspecified: Secondary | ICD-10-CM | POA: Diagnosis not present

## 2016-06-07 DIAGNOSIS — R5383 Other fatigue: Secondary | ICD-10-CM | POA: Diagnosis not present

## 2016-06-07 DIAGNOSIS — C774 Secondary and unspecified malignant neoplasm of inguinal and lower limb lymph nodes: Secondary | ICD-10-CM

## 2016-06-07 DIAGNOSIS — Z8541 Personal history of malignant neoplasm of cervix uteri: Secondary | ICD-10-CM | POA: Insufficient documentation

## 2016-06-07 DIAGNOSIS — R1031 Right lower quadrant pain: Secondary | ICD-10-CM | POA: Diagnosis not present

## 2016-06-07 DIAGNOSIS — E663 Overweight: Secondary | ICD-10-CM | POA: Diagnosis not present

## 2016-06-07 DIAGNOSIS — Z9221 Personal history of antineoplastic chemotherapy: Secondary | ICD-10-CM | POA: Diagnosis not present

## 2016-06-07 DIAGNOSIS — J439 Emphysema, unspecified: Secondary | ICD-10-CM | POA: Diagnosis not present

## 2016-06-07 NOTE — Progress Notes (Signed)
  Oncology Nurse Navigator Documentation Chaperoned pelvic exam. Follow up appt for Gyn Onc made for one year. Thoracic navigator following Ct screening and will follow up with her. Navigator Location: [facility] (06/07/16 1000) Navigator Encounter Type: Clinic/MDC (06/07/16 1000)                                          Time Spent with Patient: 15 (06/07/16 1000)

## 2016-06-07 NOTE — Progress Notes (Signed)
Patient here for follow up. Complaints of constant pain in the lower pelvic region which she states started with radiation treatment. Some trouble seeping secondary to pain, appetite good.

## 2016-06-07 NOTE — Progress Notes (Signed)
Gynecologic Oncology Interval Visit   Referring Provider: Referred by Dr. Oliva Bustard.   Chief Concern: Continued surveillance for squamous carcinoma noted in a R inguinal node, presumed cervical cancer  Subjective:  Kristen Simon is a 65 y.o. female who is seen for continued surveillance for squamous carcinoma noted in a R inguinal node, presumed cervical cancer.    She saw [name] NP 01/14/2016 and they discussed smoking cessation and lung cancer screening. She has quit smoking.   01/14/2016 CT Chest Lung Cancer Screening IMPRESSION: 1. Lung-Rads category 3, probably benign findings. Short-term follow-up in 6 months is recommended with repeat low-dose chest CT without contrast (please use the following order, "CT CHEST LCS NODULE FOLLOW-UP W/O CM"). 2. Mild diffuse bronchial wall thickening with mild centrilobular and paraseptal emphysema; imaging findings suggestive of underlying COPD. 3. Hepatic steatosis.   12/17/2015 CT scan IMPRESSION: No evidence of metastatic disease.  Uterine fibroid. Hepatic steatosis.  12/08/2015 Pap NILM; HPV negative   She presents today without any significant complaints. She still has persistent right lower quadrant abdominal pain, but this has been chronic since treatment and not worsened.  In the past she also  complainted of right groin discomfort that has able been long-standing. Her other complaints are fatigue, mild shortness of breath, intermittent constipation, and bilateral ankle swelling with prolonged standing.      Gynecologic Oncology  History Kristen Simon is a very pleasant patient with a history of squamous carcinoma noted in a R inguinal node, presumed metastatic cervical cancer.   12/2011        squamous carcinoma noted in a R inguinal node,CT /  Pet scan without definite primary,              anal exam WNL, pelvic exam normal, but incomplete due to discomfort, ECC neg,   tissue evaluation c/w/ cervical primary,                    chemo-XRT, completed NED 05/2012  Dr. Sabra Heck in August 2015 negative exam and negative Pap smear.   Dr. Oliva Bustard March 2016 for a follow-up CT scan that was negative for evidence of recurrent disease.  10/21/2014 CT scan Abdomen and pelvis IMPRESSION: 1. No evidence of recurrent or metastatic disease. 2. Question mild hepatic steatosis. 3. Suspect a 2.5 cm uterine fibroid.  October 2016:Dr. Choksi for a follow-up and had a negative exam including breast exam. Her mammogram was negative.   Problem List: Patient Active Problem List   Diagnosis Date Noted  . Personal history of tobacco use, presenting hazards to health 01/13/2016  . Benign essential HTN 01/29/2015  . Combined fat and carbohydrate induced hyperlipemia 01/29/2015  . Breathlessness on exertion 01/29/2015  . Cough 01/02/2014  . Secondary malignant neoplasm of lymph nodes of inguinal region or lower extremity ([place])   . History of cancer chemotherapy     Past Medical History: Past Medical History:  Diagnosis Date  . Allergy   . Breast screening, unspecified   . Elevated cholesterol   . History of cancer chemotherapy 2013  . Hx of radiation therapy 2013  . Hypertension 2010  . Internal hemorrhoids with other complication 0000000  . Personal history of tobacco use, presenting hazards to health   . 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    lymphoma 2013  . Ulcer ([place]) 1970   stomach-resolved  . Unilateral or unspecified femoral hernia with obstruction  Past Surgical History: Past Surgical History:  Procedure Laterality Date  . ANOSCOPY  03/27/2012   anal biopsy/hemorrhoid-benign  . APPENDECTOMY  1970  . FRACTURE SURGERY  [id number]   right wrist,right leg  . GROIN MASS OPEN BIOPSY Right 01/24/2012   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  . HERNIA REPAIR Right 01/10/2012   right femerol hernia    Past  Gynecologic History:   Gravida 1   Para 1   Age at Menarche 55   Age at Menopause 67   Regular Pap Smears Yes   Additional Hx patient does not recall any gyn problems in the past,    OB History:  OB History  Obstetric Comments  Age with first menstruation-9  LMP-age 70    Family History: Family History  Problem Relation Age of Onset  . Cancer Mother     lung    Social History: Social History   Social History  . Marital status: Married    Spouse name: N/A  . Number of children: N/A  . Years of education: N/A   Occupational History  . Not on file.   Social History Main Topics  . Smoking status: Former Smoker    Packs/day: 0.88    Years: 40.00    Quit date: 08/01/2015  . Smokeless tobacco: Never Used  . Alcohol use No  . Drug use: No  . Sexual activity: No   Other Topics Concern  . Not on file   Social History Narrative  . No narrative on file    Allergies: No Known Allergies  Current Medications: Current Outpatient Prescriptions  Medication Sig Dispense Refill  . albuterol (PROVENTIL HFA;VENTOLIN HFA) 108 (90 BASE) MCG/ACT inhaler Inhale into the lungs every 6 (six) hours as needed for wheezing or shortness of breath.    Marland Kitchen amLODipine (NORVASC) 2.5 MG tablet Take 2.5 mg by mouth daily.    Marland Kitchen aspirin 81 MG tablet Take 81 mg by mouth daily.    . clotrimazole-betamethasone (LOTRISONE) cream Apply topically.    Marland Kitchen HYDROcodone-acetaminophen (NORCO) 10-325 MG tablet Take 0.5 tablets by mouth 4 (four) times daily as needed. 60 tablet 0  . Hyprom-Naphaz-Polysorb-Zn Sulf (CLEAR EYES COMPLETE OP) Apply 2 drops to eye daily as needed (itching).    . Lactobacillus (CVS PROBIOTIC ACIDOPHILUS) 10 MG CAPS Take 1 tablet by mouth daily.    Marland Kitchen loratadine (CLARITIN REDITABS) 10 MG dissolvable tablet Take 10 mg by mouth daily.    Marland Kitchen senna-docusate (SENOKOT-S) 8.6-50 MG tablet Take 1 tablet by mouth at bedtime as needed. 30 tablet 6  . fluticasone (FLONASE) 50 MCG/ACT  nasal spray Place 2 sprays into the nose daily.     No current facility-administered medications for this visit.     Review of Systems General: fatigue Pulmonary: dyspnea Cardiac: negative Gastrointestinal: positive for abdominal pain and constipation, negative for, nausea, vomiting, diarrhea.  Genitourinary/Sexual: negative Ob/Gyn: negative for, irregular bleeding, pain Musculoskeletal: bilateral ankle swelling, right groin pain Neurologic/Psych: negative   Re: bilateral feet swelling, seasonal allergies  Objective:  Physical Examination:  BP (!) 150/81 (BP Location: Left Arm, Patient Position: Sitting)   Pulse 88   Temp 98.1 F (36.7 C) (Oral)   Ht 5\' 2"  (1.575 m)   Wt 161 lb 13.1 oz (73.4 kg)   BMI 29.60 kg/m    Body mass index is 29.6 kg/m.    ECOG Performance Status: 0 - Asymptomatic  General appearance: alert, cooperative and appears stated age 44, extra  ocular movement intact and sclera clear, anicteric Lymph node survey: non-palpable, axillary, inguinal, supraclavicular.  Cardiovascular: regular rate and rhythm Respiratory: normal air entry, lungs clear to auscultation Abdomen: soft, non-tender, without intraabdominal masses or organomegaly, no hernias and well healed incision.  Back: inspection of back is normal Extremities: extremities normal, atraumatic, no cyanosis or edema; rash on upper left thigh covered with Calamine lotion.  Neurological exam reveals alert, oriented, normal speech, no focal findings or movement disorder noted.  Pelvic: exam chaperoned by nurse;  Vulva: normal appearing vulva with no masses, tenderness or lesions; Vagina: normal vagina on prior exam she had a 1 cm nodule to the right of midline, but that was not appreciated today; Adnexa: normal adnexa in size, nontender and no masses; Uterus: uterus very small size and nontender; Cervix: no lesions, very small and stenotic os; Rectal: normal rectal, no masses    Lab  Review Labs on site today: n/a  Radiologic Imaging: As noted above    Assessment:  Kristen Simon is a 65 y.o. female diagnosed with  squamous carcinoma noted in a R inguinal node, presumed cervical cancer s/p  Chemotherapy and radiation, clinically NED. Symptoms of right abdominal and groin pain, which are chronic and prior negative CT scan. Lung-Rads category 3. Medical co-morbidities complicating care: history of tobacco use, now stopped. Overweight.  Plan:   Follow up in one year. She will see Dr. Rogue Bussing in April 2018. At her next visit we can obtain a Pap. We discussed the questionable utility of Pap given the uncertainty whether the cervix was even the primary site of disease and limited utility of Pap for cervical cancer surveillance. After 5 years we can probably extend Pap intervals to 3-5 years if we continue. Typically Pap smears are followed for 20 years post diagnosis of cervical cancer. With the changing algorithms for cervical cancer screening these recommendations may change.    At her next visit we will need to discuss release from [facility] and follow up with either her PCP, Dr. Maryland Pink has a nurse practitioner that can follow up on the pelvic exams. Imaging and laboratory assessment is based on clinical indication.   She was congratulated on smoking cessation. She needs to continue stressing good nutrition and exercise.   She will follow up with Dr. Kary Kos for her other medical needs.    She will follow up with Dr. Rogue Bussing in April 2018. In the past, Dr. Oliva Bustard ordered her mammograms and I asked her to check with Dr. B to see if he is comfortable performing her breast cancer screening.   We also discussed the Lung-Rads category 3 study. Mariea Clonts, RN contacted Burgess Estelle to set up CT screen and she will contact Renita Papa RN (who works with Dr. Jacinto Reap) so they can order the appropriate studies and follow up.   Suggested return to clinic in  12  months.    The patient's diagnosis, an outline of the further diagnostic and laboratory studies which will be required, the recommendation, and alternatives were discussed.  All questions were answered to the patient's satisfaction.   Gillis Ends, MD    CC:  Dr. Maryland Pink

## 2016-06-19 ENCOUNTER — Other Ambulatory Visit: Payer: Self-pay | Admitting: *Deleted

## 2016-06-19 DIAGNOSIS — C774 Secondary and unspecified malignant neoplasm of inguinal and lower limb lymph nodes: Secondary | ICD-10-CM

## 2016-06-19 MED ORDER — HYDROCODONE-ACETAMINOPHEN 10-325 MG PO TABS
0.5000 | ORAL_TABLET | Freq: Four times a day (QID) | ORAL | 0 refills | Status: DC | PRN
Start: 2016-06-19 — End: 2016-07-18

## 2016-06-27 ENCOUNTER — Telehealth: Payer: Self-pay | Admitting: *Deleted

## 2016-06-27 NOTE — Telephone Encounter (Signed)
Attempted to leave voicemail for patient notifyng them that it is time to schedule annual low dose lung cancer screening CT scan. However there is no voicemail option at number in EMR.

## 2016-07-04 ENCOUNTER — Telehealth: Payer: Self-pay | Admitting: *Deleted

## 2016-07-04 NOTE — Telephone Encounter (Signed)
Notified patient that follow up from lung cancer screening low dose CT scan is due. Confirmed that patient is within the age range of 55-77, and asymptomatic, (no signs or symptoms of lung cancer). Patient denies illness that would prevent curative treatment for lung cancer if found. The patient is a former smoker quit 08/01/15, with a 35 pack year history. The shared decision making visit was done 01/14/16. Patient is agreeable for CT scan being scheduled.

## 2016-07-18 ENCOUNTER — Other Ambulatory Visit: Payer: Self-pay | Admitting: *Deleted

## 2016-07-18 DIAGNOSIS — C774 Secondary and unspecified malignant neoplasm of inguinal and lower limb lymph nodes: Secondary | ICD-10-CM

## 2016-07-18 MED ORDER — HYDROCODONE-ACETAMINOPHEN 10-325 MG PO TABS: 0.5000 | ORAL_TABLET | Freq: Four times a day (QID) | ORAL | 0 refills | Status: DC | PRN

## 2016-07-18 NOTE — Telephone Encounter (Signed)
Reports that she can wait until tomorrow to pick up  rx

## 2016-07-19 ENCOUNTER — Other Ambulatory Visit: Payer: Self-pay | Admitting: Family Medicine

## 2016-07-19 DIAGNOSIS — Z1231 Encounter for screening mammogram for malignant neoplasm of breast: Secondary | ICD-10-CM

## 2016-08-16 ENCOUNTER — Telehealth: Payer: Self-pay | Admitting: *Deleted

## 2016-08-16 DIAGNOSIS — C774 Secondary and unspecified malignant neoplasm of inguinal and lower limb lymph nodes: Secondary | ICD-10-CM

## 2016-08-16 MED ORDER — HYDROCODONE-ACETAMINOPHEN 10-325 MG PO TABS: 0.5000 | ORAL_TABLET | Freq: Four times a day (QID) | ORAL | 0 refills | Status: DC | PRN

## 2016-08-16 NOTE — Telephone Encounter (Signed)
Prescription refilled and waiting for MD signature.

## 2016-08-23 ENCOUNTER — Other Ambulatory Visit: Payer: Self-pay | Admitting: *Deleted

## 2016-08-23 DIAGNOSIS — R9389 Abnormal findings on diagnostic imaging of other specified body structures: Secondary | ICD-10-CM

## 2016-08-29 ENCOUNTER — Ambulatory Visit
Admission: RE | Admit: 2016-08-29 | Discharge: 2016-08-29 | Disposition: A | Discharge status: Home / Self Care | Payer: BLUE CROSS/BLUE SHIELD | Source: Ambulatory Visit | Attending: Family Medicine | Admitting: Family Medicine

## 2016-08-29 DIAGNOSIS — Z1231 Encounter for screening mammogram for malignant neoplasm of breast: Secondary | ICD-10-CM

## 2016-08-29 HISTORY — DX: Personal history of irradiation: Z92.3

## 2016-08-29 HISTORY — DX: Personal history of antineoplastic chemotherapy: Z92.21

## 2016-09-06 ENCOUNTER — Ambulatory Visit: Payer: BLUE CROSS/BLUE SHIELD

## 2016-09-06 ENCOUNTER — Ambulatory Visit
Admission: RE | Admit: 2016-09-06 | Discharge: 2016-09-06 | Disposition: A | Discharge status: Home / Self Care | Payer: BLUE CROSS/BLUE SHIELD | Source: Ambulatory Visit | Attending: Oncology | Admitting: Oncology

## 2016-09-06 DIAGNOSIS — K760 Fatty (change of) liver, not elsewhere classified: Secondary | ICD-10-CM | POA: Insufficient documentation

## 2016-09-06 DIAGNOSIS — R9389 Abnormal findings on diagnostic imaging of other specified body structures: Secondary | ICD-10-CM

## 2016-09-06 DIAGNOSIS — R918 Other nonspecific abnormal finding of lung field: Secondary | ICD-10-CM | POA: Diagnosis not present

## 2016-09-06 DIAGNOSIS — R938 Abnormal findings on diagnostic imaging of other specified body structures: Secondary | ICD-10-CM | POA: Insufficient documentation

## 2016-09-07 ENCOUNTER — Encounter: Payer: Self-pay | Admitting: *Deleted

## 2016-09-15 ENCOUNTER — Other Ambulatory Visit: Payer: Self-pay | Admitting: *Deleted

## 2016-09-15 DIAGNOSIS — C774 Secondary and unspecified malignant neoplasm of inguinal and lower limb lymph nodes: Secondary | ICD-10-CM

## 2016-09-15 MED ORDER — HYDROCODONE-ACETAMINOPHEN 10-325 MG PO TABS: 0.5000 | ORAL_TABLET | Freq: Four times a day (QID) | ORAL | 0 refills | Status: DC | PRN

## 2016-09-29 ENCOUNTER — Telehealth: Payer: Self-pay | Admitting: *Deleted

## 2016-09-29 NOTE — Telephone Encounter (Signed)
Notified patient of LDCT lung cancer screening results with recommendation for 12 month follow up imaging. Also notified of incidental finding noted below. Patient verbalizes understanding.   IMPRESSION: 1. Lung-RADS Category 2, benign appearance or behavior. Continue annual screening with low-dose chest CT without contrast in 12 months. Lingular opacity is unchanged and favored to represent scarring or chronic atelectasis. 2. Moderate hepatic steatosis

## 2016-10-16 ENCOUNTER — Other Ambulatory Visit: Payer: Self-pay | Admitting: *Deleted

## 2016-10-16 DIAGNOSIS — C774 Secondary and unspecified malignant neoplasm of inguinal and lower limb lymph nodes: Secondary | ICD-10-CM

## 2016-10-16 MED ORDER — HYDROCODONE-ACETAMINOPHEN 10-325 MG PO TABS: 0.5000 | ORAL_TABLET | Freq: Four times a day (QID) | ORAL | 0 refills | Status: DC | PRN

## 2016-11-10 ENCOUNTER — Other Ambulatory Visit: Payer: Self-pay | Admitting: *Deleted

## 2016-11-10 DIAGNOSIS — C774 Secondary and unspecified malignant neoplasm of inguinal and lower limb lymph nodes: Secondary | ICD-10-CM

## 2016-11-10 MED ORDER — HYDROCODONE-ACETAMINOPHEN 10-325 MG PO TABS: 0.5000 | ORAL_TABLET | Freq: Four times a day (QID) | ORAL | 0 refills | Status: DC | PRN

## 2016-12-12 ENCOUNTER — Other Ambulatory Visit: Payer: Self-pay | Admitting: *Deleted

## 2016-12-12 DIAGNOSIS — C774 Secondary and unspecified malignant neoplasm of inguinal and lower limb lymph nodes: Secondary | ICD-10-CM

## 2016-12-12 MED ORDER — HYDROCODONE-ACETAMINOPHEN 10-325 MG PO TABS: 0.5000 | ORAL_TABLET | Freq: Four times a day (QID) | ORAL | 0 refills | Status: DC | PRN

## 2016-12-22 ENCOUNTER — Inpatient Hospital Stay: Payer: BLUE CROSS/BLUE SHIELD | Attending: Internal Medicine | Admitting: Internal Medicine

## 2016-12-22 ENCOUNTER — Inpatient Hospital Stay: Payer: BLUE CROSS/BLUE SHIELD

## 2016-12-22 VITALS — BP 145/83 | HR 85 | Temp 97.0°F | Resp 20 | Ht 62.0 in | Wt 164.0 lb

## 2016-12-22 DIAGNOSIS — Z79899 Other long term (current) drug therapy: Secondary | ICD-10-CM | POA: Diagnosis not present

## 2016-12-22 DIAGNOSIS — I10 Essential (primary) hypertension: Secondary | ICD-10-CM | POA: Diagnosis not present

## 2016-12-22 DIAGNOSIS — Z9221 Personal history of antineoplastic chemotherapy: Secondary | ICD-10-CM | POA: Diagnosis not present

## 2016-12-22 DIAGNOSIS — C774 Secondary and unspecified malignant neoplasm of inguinal and lower limb lymph nodes: Secondary | ICD-10-CM

## 2016-12-22 DIAGNOSIS — E7800 Pure hypercholesterolemia, unspecified: Secondary | ICD-10-CM | POA: Insufficient documentation

## 2016-12-22 DIAGNOSIS — Z923 Personal history of irradiation: Secondary | ICD-10-CM

## 2016-12-22 DIAGNOSIS — C801 Malignant (primary) neoplasm, unspecified: Secondary | ICD-10-CM | POA: Diagnosis not present

## 2016-12-22 DIAGNOSIS — Z7982 Long term (current) use of aspirin: Secondary | ICD-10-CM | POA: Insufficient documentation

## 2016-12-22 DIAGNOSIS — F17210 Nicotine dependence, cigarettes, uncomplicated: Secondary | ICD-10-CM | POA: Insufficient documentation

## 2016-12-22 LAB — COMPREHENSIVE METABOLIC PANEL
ALT: 39 U/L (ref 14–54)
AST: 37 U/L (ref 15–41)
Albumin: 4.3 g/dL (ref 3.5–5.0)
Alkaline Phosphatase: 49 U/L (ref 38–126)
Anion gap: 5 (ref 5–15)
BUN: 10 mg/dL (ref 6–20)
CO2: 24 mmol/L (ref 22–32)
CREATININE: 0.62 mg/dL (ref 0.44–1.00)
Calcium: 9.6 mg/dL (ref 8.9–10.3)
Chloride: 107 mmol/L (ref 101–111)
Glucose, Bld: 139 mg/dL — ABNORMAL HIGH (ref 65–99)
POTASSIUM: 4.1 mmol/L (ref 3.5–5.1)
SODIUM: 136 mmol/L (ref 135–145)
Total Bilirubin: 0.5 mg/dL (ref 0.3–1.2)
Total Protein: 7.3 g/dL (ref 6.5–8.1)

## 2016-12-22 LAB — CBC WITH DIFFERENTIAL/PLATELET
BASOS PCT: 1 %
Basophils Absolute: 0 10*3/uL (ref 0–0.1)
EOS ABS: 0.2 10*3/uL (ref 0–0.7)
Eosinophils Relative: 4 %
HCT: 39.2 % (ref 35.0–47.0)
HEMOGLOBIN: 13.9 g/dL (ref 12.0–16.0)
Lymphocytes Relative: 45 %
Lymphs Abs: 2 10*3/uL (ref 1.0–3.6)
MCH: 30.8 pg (ref 26.0–34.0)
MCHC: 35.5 g/dL (ref 32.0–36.0)
MCV: 86.8 fL (ref 80.0–100.0)
Monocytes Absolute: 0.4 10*3/uL (ref 0.2–0.9)
Monocytes Relative: 9 %
NEUTROS PCT: 41 %
Neutro Abs: 1.8 10*3/uL (ref 1.4–6.5)
Platelets: 260 10*3/uL (ref 150–440)
RBC: 4.51 MIL/uL (ref 3.80–5.20)
RDW: 13.2 % (ref 11.5–14.5)
WBC: 4.4 10*3/uL (ref 3.6–11.0)

## 2016-12-22 NOTE — Progress Notes (Signed)
[place] OFFICE PROGRESS NOTE  Patient Care Team: [place] Pink, MD as PCP - General (Family Medicine) Clent Jacks, RN as Registered Nurse  Cancer Staging No matching staging information was found for the patient.   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 I reviewed the patient's prior charts/pertinent labs/imaging in detail; findings are summarized above.     INTERVAL HISTORY:  Kristen Simon 66 y.o.  female pleasant patient above history of Carcinoma noted of the right inguinal region- presumed cervical primary- status post chemoradiation is here for follow-up.  Patient approximately 6 months ago had evaluation with gynecology oncology noted to have no recurrence.  Patient the last month or so noted to have worsening pain in the right groin [which is worse than her baseline pain]; she also noted to have found some "knots" In the groin. Denies any weight loss. Denies any blood in stools. Denies any difficulty with urination. She has quit smoking. She is gaining weight.  Reviewed in detail gynecology oncology note from October 2017  REVIEW OF SYSTEMS:  A complete 10 point review of system is done which is negative except mentioned above/history of present illness.   PAST MEDICAL HISTORY :  Past Medical History:  Diagnosis Date  . Allergy   . Breast screening, unspecified   . Elevated cholesterol   . History of cancer chemotherapy 2013  . Hx of radiation therapy 2013  . Hypertension 2010  . Internal hemorrhoids with other complication 2542  . Personal history of  chemotherapy   . Personal history of radiation therapy   . Personal history of tobacco use, presenting hazards to health   . 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    lymphoma 2013  . Ulcer ([place]) 1970   stomach-resolved  . Unilateral or unspecified femoral hernia with obstruction     PAST SURGICAL HISTORY :   Past Surgical History:  Procedure Laterality Date  . ANOSCOPY  03/27/2012   anal biopsy/hemorrhoid-benign  . APPENDECTOMY  1970  . FRACTURE SURGERY  7062,3762   right wrist,right leg  . GROIN MASS OPEN BIOPSY Right 01/24/2012   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  . HERNIA REPAIR Right 01/10/2012   right femerol hernia    FAMILY HISTORY :   Family History  Problem Relation Age of Onset  . Cancer Mother     lung  . Breast cancer Maternal Aunt     SOCIAL HISTORY:   Social History  Substance Use Topics  . Smoking status: Former Smoker    Packs/day: 0.88    Years: 40.00    Quit date: 08/01/2015  . Smokeless tobacco: Never Used  . Alcohol use No    ALLERGIES:  has No Known Allergies.  MEDICATIONS:  Current Outpatient Prescriptions  Medication Sig Dispense Refill  . albuterol (PROVENTIL HFA;VENTOLIN HFA) 108 (90 BASE) MCG/ACT inhaler Inhale into the lungs every 6 (six) hours as needed for wheezing or shortness of breath.    Marland Kitchen amLODipine (NORVASC) 2.5 MG  tablet Take 7.5 mg by mouth daily.     Marland Kitchen aspirin 81 MG tablet Take 81 mg by mouth daily.    . cyclobenzaprine (FLEXERIL) 5 MG tablet Take 5 mg by mouth daily as needed for muscle pain.    . fluticasone (FLONASE) 50 MCG/ACT nasal spray Place 2 sprays into the nose daily.    Marland Kitchen HYDROcodone-acetaminophen (NORCO) 10-325 MG tablet Take 0.5 tablets by mouth 4 (four) times daily as needed. 60 tablet 0  . Lactobacillus ([employer]S PROBIOTIC ACIDOPHILUS) 10 MG CAPS Take 1 tablet by mouth daily.    Marland Kitchen loratadine (CLARITIN REDITABS) 10 MG  dissolvable tablet Take 10 mg by mouth daily.    Marland Kitchen senna-docusate (SENOKOT-S) 8.6-50 MG tablet Take 1 tablet by mouth at bedtime as needed. 30 tablet 6  . Hyprom-Naphaz-Polysorb-Zn Sulf (CLEAR EYES COMPLETE OP) Apply 2 drops to eye daily as needed (itching).    . Magnesium Oxide (MAG-OX 400 PO) Take 1 tablet by mouth as needed for muscle spasms.     No current facility-administered medications for this visit.     PHYSICAL EXAMINATION: ECOG PERFORMANCE STATUS: 1 - Symptomatic but completely ambulatory  BP (!) 145/83   Pulse 85   Temp 97 F (36.1 C) (Tympanic)   Resp 20   Ht 5\' 2"  (1.575 m)   Wt 164 lb (74.4 kg)   BMI 30.00 kg/m   Filed Weights   12/22/16 1111  Weight: 164 lb (74.4 kg)    GENERAL: Well-nourished well-developed; Alert, no distress and comfortable.   Accompanied by her husband. Obese.  EYES: no pallor or icterus OROPHARYNX: no thrush or ulceration; NECK: supple, no masses felt LYMPH:  no palpable lymphadenopathy in the cervical, axillaryregion.  Right inguinal region approximately 1-2 cm lump noted [lymph node versus scar tissue] LUNGS: clear to auscultation and  No wheeze or crackles HEART/CVS: regular rate & rhythm and no murmurs; No lower extremity edema ABDOMEN:abdomen soft, ? Tenderness  right lower quadrant inguinal region [? chronic] and normal bowel sounds Musculoskeletal:no cyanosis of digits and no clubbing  PSYCH: alert & oriented x 3 with fluent speech NEURO: no focal motor/sensory deficits SKIN:  no rashes or significant lesions  LABORATORY DATA:  I have reviewed the data as listed    Component Value Date/Time   NA 136 12/22/2016 1040   NA 137 12/09/2014 1114   K 4.1 12/22/2016 1040   K 3.7 12/09/2014 1114   CL 107 12/22/2016 1040   CL 103 12/09/2014 1114   CO2 24 12/22/2016 1040   CO2 27 12/09/2014 1114   GLUCOSE 139 (H) 12/22/2016 1040   GLUCOSE 130 (H) 12/09/2014 1114   BUN 10 12/22/2016 1040   BUN 10 12/09/2014 1114   CREATININE  0.62 12/22/2016 1040   CREATININE 0.62 12/09/2014 1114   CALCIUM 9.6 12/22/2016 1040   CALCIUM 9.3 12/09/2014 1114   PROT 7.3 12/22/2016 1040   PROT 7.2 12/09/2014 1114   ALBUMIN 4.3 12/22/2016 1040   ALBUMIN 4.4 12/09/2014 1114   AST 37 12/22/2016 1040   AST 31 12/09/2014 1114   ALT 39 12/22/2016 1040   ALT 35 12/09/2014 1114   ALKPHOS 49 12/22/2016 1040   ALKPHOS 47 12/09/2014 1114   BILITOT 0.5 12/22/2016 1040   BILITOT 0.6 12/09/2014 1114   GFRNONAA >60 12/22/2016 1040   GFRNONAA >60 12/09/2014 1114   GFRAA >60 12/22/2016 1040   GFRAA >60 12/09/2014 1114    No results found for: SPEP, UPEP  Lab Results  Component Value Date   WBC 4.4 12/22/2016   NEUTROABS 1.8 12/22/2016   HGB 13.9 12/22/2016   HCT 39.2 12/22/2016   MCV 86.8 12/22/2016   PLT 260 12/22/2016      Chemistry      Component Value Date/Time   NA 136 12/22/2016 1040   NA 137 12/09/2014 1114   K 4.1 12/22/2016 1040   K 3.7 12/09/2014 1114   CL 107 12/22/2016 1040   CL 103 12/09/2014 1114   CO2 24 12/22/2016 1040   CO2 27 12/09/2014 1114   BUN 10 12/22/2016 1040   BUN 10 12/09/2014 1114   CREATININE 0.62 12/22/2016 1040   CREATININE 0.62 12/09/2014 1114      Component Value Date/Time   CALCIUM 9.6 12/22/2016 1040   CALCIUM 9.3 12/09/2014 1114   ALKPHOS 49 12/22/2016 1040   ALKPHOS 47 12/09/2014 1114   AST 37 12/22/2016 1040   AST 31 12/09/2014 1114   ALT 39 12/22/2016 1040   ALT 35 12/09/2014 1114   BILITOT 0.5 12/22/2016 1040   BILITOT 0.6 12/09/2014 1114       RADIOGRAPHIC STUDIES: I have personally reviewed the radiological images as listed and agreed with the findings in the report. No results found.   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 Given the "lump" felt in the right groin/ worsening pain- question  recurrence. CT scan in April 2017- reviewed no evidence of disease. I think would be reasonable to get a CT scan of the abdomen and pelvis with contrast ASAP.  # Right lower quadrant/inguinal pain- question chronic versus acute. Continue hydrocodone as patient is currently taking. I reviewed the scan as discussed above.  # Again congratulated on quitting smoking.  # Labs reviewed- within normal limits.  # follow up few days post CT scan; no labs.   # 25 minutes face-to-face with the patient discussing the above plan of care; more than 50% of time spent on prognosis/ natural history; counseling and coordination.    Orders Placed This Encounter  Procedures  . CT ABDOMEN PELVIS W CONTRAST    Standing Status:   Future    Standing Expiration Date:   03/23/2018    Order Specific Question:   Reason for Exam (SYMPTOM  OR DIAGNOSIS REQUIRED)    Answer:   right inguinal adenopathy    Order Specific Question:   Preferred imaging location?    Answer:   [facility]   All questions were answered. The patient knows to call the clinic with any problems, questions or concerns.      Cammie Sickle, MD 12/22/2016 12:59 PM

## 2016-12-22 NOTE — Assessment & Plan Note (Addendum)
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 Given the "lump" felt in the right groin/ worsening pain- question recurrence. CT scan in April 2017- reviewed no evidence of disease. I think would be reasonable to get a CT scan of the abdomen and pelvis with contrast ASAP.  # Right lower quadrant/inguinal pain- question chronic versus acute. Continue hydrocodone as patient is currently taking. I reviewed the scan as discussed above.  # Again congratulated on quitting smoking.  # Labs reviewed- within normal limits.  # follow up few days post CT scan; no labs.   # 25 minutes face-to-face with the patient discussing the above plan of care; more than 50% of time spent on prognosis/ natural history; counseling and coordination.

## 2016-12-25 ENCOUNTER — Ambulatory Visit: Payer: BLUE CROSS/BLUE SHIELD

## 2016-12-27 ENCOUNTER — Ambulatory Visit
Admission: RE | Admit: 2016-12-27 | Discharge: 2016-12-27 | Disposition: A | Discharge status: Home / Self Care | Payer: BLUE CROSS/BLUE SHIELD | Source: Ambulatory Visit | Attending: Internal Medicine | Admitting: Internal Medicine

## 2016-12-27 DIAGNOSIS — C774 Secondary and unspecified malignant neoplasm of inguinal and lower limb lymph nodes: Secondary | ICD-10-CM | POA: Diagnosis present

## 2016-12-27 DIAGNOSIS — R935 Abnormal findings on diagnostic imaging of other abdominal regions, including retroperitoneum: Secondary | ICD-10-CM | POA: Insufficient documentation

## 2016-12-27 DIAGNOSIS — C801 Malignant (primary) neoplasm, unspecified: Secondary | ICD-10-CM | POA: Insufficient documentation

## 2016-12-27 DIAGNOSIS — K5730 Diverticulosis of large intestine without perforation or abscess without bleeding: Secondary | ICD-10-CM | POA: Insufficient documentation

## 2016-12-27 DIAGNOSIS — K760 Fatty (change of) liver, not elsewhere classified: Secondary | ICD-10-CM | POA: Diagnosis not present

## 2016-12-27 MED ORDER — IOPAMIDOL (ISOVUE-300) INJECTION 61%
100.0000 mL | Freq: Once | INTRAVENOUS | Status: AC | PRN
Start: 2016-12-27 — End: 2016-12-27
  Administered 2016-12-27: 100 mL via INTRAVENOUS

## 2016-12-28 ENCOUNTER — Inpatient Hospital Stay: Payer: BLUE CROSS/BLUE SHIELD | Attending: Internal Medicine | Admitting: Internal Medicine

## 2016-12-28 VITALS — BP 165/81 | HR 81 | Temp 97.6°F | Resp 18 | Ht 62.0 in | Wt 161.0 lb

## 2016-12-28 DIAGNOSIS — I10 Essential (primary) hypertension: Secondary | ICD-10-CM | POA: Diagnosis not present

## 2016-12-28 DIAGNOSIS — Z8572 Personal history of non-Hodgkin lymphomas: Secondary | ICD-10-CM | POA: Diagnosis not present

## 2016-12-28 DIAGNOSIS — E7800 Pure hypercholesterolemia, unspecified: Secondary | ICD-10-CM | POA: Insufficient documentation

## 2016-12-28 DIAGNOSIS — C801 Malignant (primary) neoplasm, unspecified: Secondary | ICD-10-CM | POA: Insufficient documentation

## 2016-12-28 DIAGNOSIS — I700 Atherosclerosis of aorta: Secondary | ICD-10-CM | POA: Insufficient documentation

## 2016-12-28 DIAGNOSIS — K760 Fatty (change of) liver, not elsewhere classified: Secondary | ICD-10-CM | POA: Insufficient documentation

## 2016-12-28 DIAGNOSIS — K5730 Diverticulosis of large intestine without perforation or abscess without bleeding: Secondary | ICD-10-CM | POA: Insufficient documentation

## 2016-12-28 DIAGNOSIS — Z923 Personal history of irradiation: Secondary | ICD-10-CM | POA: Insufficient documentation

## 2016-12-28 DIAGNOSIS — C774 Secondary and unspecified malignant neoplasm of inguinal and lower limb lymph nodes: Secondary | ICD-10-CM | POA: Diagnosis not present

## 2016-12-28 DIAGNOSIS — Z9221 Personal history of antineoplastic chemotherapy: Secondary | ICD-10-CM | POA: Diagnosis not present

## 2016-12-28 DIAGNOSIS — Z87891 Personal history of nicotine dependence: Secondary | ICD-10-CM | POA: Insufficient documentation

## 2016-12-28 DIAGNOSIS — Z7982 Long term (current) use of aspirin: Secondary | ICD-10-CM | POA: Diagnosis not present

## 2016-12-28 NOTE — Progress Notes (Signed)
[facility] OFFICE PROGRESS NOTE  Patient Care Team: [place] Pink, MD as PCP - General (Family Medicine) Clent Jacks, RN as Registered Nurse  Cancer Staging No matching staging information was found for the patient.   Oncology History   # squamous carcinoma noted in a R inguinal node, presumed cervical cancer s/p  Chemotherapy and radiation     Secondary malignant neoplasm of lymph nodes of inguinal region or lower extremity ([place])    Initial Diagnosis    Secondary malignant neoplasm of lymph nodes of inguinal region or lower extremity ([place])        INTERVAL HISTORY:  Kristen Simon 66 y.o.  female pleasant patient above history of Carcinoma noted of the right inguinal region- presumed cervical primary- status post chemoradiation is here for follow-up/ To review the results of her CT scan abdomen and pelvis that was ordered for her "lump in the right groin ".   Denies any weight loss. Denies any blood in stools. Denies any difficulty with urination. She has quit smoking. She is gaining weight. Patient denies any vaginal bleeding. Patient had a recent evaluation in October 2017- that was negative for any recurrence.  REVIEW OF SYSTEMS:  A complete 10 point review of system is done which is negative except mentioned above/history of present illness.   PAST MEDICAL HISTORY :  Past Medical History:  Diagnosis Date  . Allergy   . Breast screening, unspecified   . Elevated cholesterol   . History of cancer chemotherapy 2013  . Hx of radiation therapy 2013  . Hypertension 2010  . Internal hemorrhoids with other complication 2951  . Personal history of chemotherapy   . Personal history of radiation therapy   . Personal history of tobacco use, presenting hazards to health   . 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    lymphoma 2013  . Ulcer 1970   stomach-resolved  . Unilateral or unspecified femoral  hernia with obstruction     PAST SURGICAL HISTORY :   Past Surgical History:  Procedure Laterality Date  . ANOSCOPY  03/27/2012   anal biopsy/hemorrhoid-benign  . APPENDECTOMY  1970  . FRACTURE SURGERY  [id number]   right wrist,right leg  . GROIN MASS OPEN BIOPSY Right 01/24/2012   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  . HERNIA REPAIR Right 01/10/2012   right femerol hernia    FAMILY HISTORY :   Family History  Problem Relation Age of Onset  . Cancer Mother     lung  . Breast cancer Maternal Aunt     SOCIAL HISTORY:   Social History  Substance Use Topics  . Smoking status: Former Smoker    Packs/day: 0.88    Years: 40.00    Quit date: 08/01/2015  . Smokeless tobacco: Never Used  . Alcohol use No    ALLERGIES:  has No Known Allergies.  MEDICATIONS:  Current Outpatient Prescriptions  Medication Sig Dispense Refill  . albuterol (PROVENTIL HFA;VENTOLIN HFA) 108 (90 BASE) MCG/ACT inhaler Inhale into the lungs every 6 (six) hours as needed for wheezing or shortness of breath.    Marland Kitchen amLODipine (NORVASC) 2.5 MG tablet Take 7.5 mg by mouth daily.     Marland Kitchen aspirin 81 MG tablet Take 81 mg by mouth daily.    . cyclobenzaprine (FLEXERIL) 5 MG tablet Take 5 mg by mouth daily as needed for muscle pain.    . fluticasone (FLONASE) 50  MCG/ACT nasal spray Place 2 sprays into the nose daily.    Marland Kitchen HYDROcodone-acetaminophen (NORCO) 10-325 MG tablet Take 0.5 tablets by mouth 4 (four) times daily as needed. 60 tablet 0  . Hyprom-Naphaz-Polysorb-Zn Sulf (CLEAR EYES COMPLETE OP) Apply 2 drops to eye daily as needed (itching).    . Lactobacillus (CVS PROBIOTIC ACIDOPHILUS) 10 MG CAPS Take 1 tablet by mouth daily.    Marland Kitchen loratadine (CLARITIN REDITABS) 10 MG dissolvable tablet Take 10 mg by mouth daily.    . Magnesium Oxide (MAG-OX 400 PO) Take 1 tablet by mouth as needed for muscle spasms.    Marland Kitchen senna-docusate (SENOKOT-S) 8.6-50 MG tablet Take 1 tablet by mouth at  bedtime as needed. 30 tablet 6   No current facility-administered medications for this visit.     PHYSICAL EXAMINATION: ECOG PERFORMANCE STATUS: 1 - Symptomatic but completely ambulatory  BP (!) 165/81 (Patient Position: Sitting)   Pulse 81   Temp 97.6 F (36.4 C) (Tympanic)   Resp 18   Ht 5\' 2"  (1.575 m)   Wt 161 lb (73 kg)   BMI 29.45 kg/m   Filed Weights   12/28/16 1027  Weight: 161 lb (73 kg)    GENERAL: Well-nourished well-developed; Alert, no distress and comfortable.   Accompanied by her husband. Obese.  EYES: no pallor or icterus OROPHARYNX: no thrush or ulceration; NECK: supple, no masses felt LYMPH:  no palpable lymphadenopathy in the cervical, axillaryregion.  Right inguinal region approximately 1-2 cm lump noted [lymph node versus scar tissue] LUNGS: clear to auscultation and  No wheeze or crackles HEART/CVS: regular rate & rhythm and no murmurs; No lower extremity edema ABDOMEN:abdomen soft, ? Tenderness  right lower quadrant inguinal region [? chronic] and normal bowel sounds Musculoskeletal:no cyanosis of digits and no clubbing  PSYCH: alert & oriented x 3 with fluent speech NEURO: no focal motor/sensory deficits SKIN:  no rashes or significant lesions  LABORATORY DATA:  I have reviewed the data as listed    Component Value Date/Time   NA 136 12/22/2016 1040   NA 137 12/09/2014 1114   K 4.1 12/22/2016 1040   K 3.7 12/09/2014 1114   CL 107 12/22/2016 1040   CL 103 12/09/2014 1114   CO2 24 12/22/2016 1040   CO2 27 12/09/2014 1114   GLUCOSE 139 (H) 12/22/2016 1040   GLUCOSE 130 (H) 12/09/2014 1114   BUN 10 12/22/2016 1040   BUN 10 12/09/2014 1114   CREATININE 0.62 12/22/2016 1040   CREATININE 0.62 12/09/2014 1114   CALCIUM 9.6 12/22/2016 1040   CALCIUM 9.3 12/09/2014 1114   PROT 7.3 12/22/2016 1040   PROT 7.2 12/09/2014 1114   ALBUMIN 4.3 12/22/2016 1040   ALBUMIN 4.4 12/09/2014 1114   AST 37 12/22/2016 1040   AST 31 12/09/2014 1114   ALT 39  12/22/2016 1040   ALT 35 12/09/2014 1114   ALKPHOS 49 12/22/2016 1040   ALKPHOS 47 12/09/2014 1114   BILITOT 0.5 12/22/2016 1040   BILITOT 0.6 12/09/2014 1114   GFRNONAA >60 12/22/2016 1040   GFRNONAA >60 12/09/2014 1114   GFRAA >60 12/22/2016 1040   GFRAA >60 12/09/2014 1114    No results found for: SPEP, UPEP  Lab Results  Component Value Date   WBC 4.4 12/22/2016   NEUTROABS 1.8 12/22/2016   HGB 13.9 12/22/2016   HCT 39.2 12/22/2016   MCV 86.8 12/22/2016   PLT 260 12/22/2016      Chemistry      Component Value  Date/Time   NA 136 12/22/2016 1040   NA 137 12/09/2014 1114   K 4.1 12/22/2016 1040   K 3.7 12/09/2014 1114   CL 107 12/22/2016 1040   CL 103 12/09/2014 1114   CO2 24 12/22/2016 1040   CO2 27 12/09/2014 1114   BUN 10 12/22/2016 1040   BUN 10 12/09/2014 1114   CREATININE 0.62 12/22/2016 1040   CREATININE 0.62 12/09/2014 1114      Component Value Date/Time   CALCIUM 9.6 12/22/2016 1040   CALCIUM 9.3 12/09/2014 1114   ALKPHOS 49 12/22/2016 1040   ALKPHOS 47 12/09/2014 1114   AST 37 12/22/2016 1040   AST 31 12/09/2014 1114   ALT 39 12/22/2016 1040   ALT 35 12/09/2014 1114   BILITOT 0.5 12/22/2016 1040   BILITOT 0.6 12/09/2014 1114       RADIOGRAPHIC STUDIES: I have personally reviewed the radiological images as listed and agreed with the findings in the report. Ct Abdomen Pelvis W Contrast  Result Date: 12/27/2016 CLINICAL DATA:  Right inguinal lymph node squamous cell carcinoma. Personal history of lymphoma. EXAM: CT ABDOMEN AND PELVIS WITH CONTRAST TECHNIQUE: Multidetector CT imaging of the abdomen and pelvis was performed using the standard protocol following bolus administration of intravenous contrast. CONTRAST:  144mL ISOVUE-300 IOPAMIDOL (ISOVUE-300) INJECTION 61% COMPARISON:  12/17/2015 and 10/21/2014 FINDINGS: Lower Chest: No acute findings. Hepatobiliary: Diffuse hepatic steatosis again noted. Stable appearance of focal fatty sparing or  vascular shunt in the inferior right hepatic lobe. No liver masses identified. Gallbladder is unremarkable. Pancreas:  No mass or inflammatory changes. Spleen: Within normal limits in size and appearance. Adrenals/Urinary Tract: No masses identified. Stable small bilateral renal cysts. No evidence of hydronephrosis. Unremarkable unopacified urinary bladder. Stomach/Bowel: No evidence of bowel obstruction, inflammatory changes, or abnormal fluid collections. Diverticulosis of descending and sigmoid colon again demonstrated, without evidence of diverticulitis. Vascular/Lymphatic: No pathologically enlarged lymph nodes. No evidence of inguinal lymphadenopathy or mass. No abdominal aortic aneurysm. Aortic atherosclerosis. Reproductive: A subtle ill-defined low-attenuation area is seen in the lower uterine segment and cervix which measures 3.1 x 2.7 cm. This could be due to cervical carcinoma or lower uterine segment fibroid. No evidence of adnexal mass or free fluid. Other:  None. Musculoskeletal:  No suspicious bone lesions identified. IMPRESSION: 3 cm ill-defined low-attenuation area in lower uterine segment and cervix, which could be due to cervical carcinoma or lower uterine segment fibroid. No evidence of metastatic disease within the pelvis or abdomen. No evidence of inguinal mass or lymphadenopathy. Colonic diverticulosis. No radiographic evidence of diverticulitis. Hepatic steatosis. Electronically Signed   By: Earle Gell M.D.   On: 12/27/2016 12:46     ASSESSMENT & PLAN:  Secondary malignant neoplasm of lymph nodes of inguinal region or lower extremity ([place]) Right inguinal node squamous cell carcinoma- question cervical primary status post chemoradiation. CT scan of the abdomen and pelvis does not show any pelvic or inguinal adenopathy. However shows approximately 3 x 2 cm mass- cervix versus uterine fibroid. Recommend evaluation with gynecology oncology. Patient interested in following up with Dr.  Theora Gianotti.   # Again congratulated on quitting smoking.  # follow up in 6 months; if no acute issues or sooner based on Gyn/Dr.Secord visit. Will inform Drue Dun; Therapist, sports.    Orders Placed This Encounter  Procedures  . CBC with Differential/Platelet    Standing Status:   Future    Standing Expiration Date:   12/28/2017  . Comprehensive metabolic panel    Standing Status:  Future    Standing Expiration Date:   12/28/2017   All questions were answered. The patient knows to call the clinic with any problems, questions or concerns.      Cammie Sickle, MD 12/28/2016 12:03 PM

## 2016-12-28 NOTE — Assessment & Plan Note (Addendum)
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 CT scan of the abdomen and pelvis does not show any pelvic or inguinal adenopathy. However shows approximately 3 x 2 cm mass- cervix versus uterine fibroid. Recommend evaluation with gynecology oncology. Patient interested in following up with Dr. Theora Gianotti.   # Again congratulated on quitting smoking.  # follow up in 6 months; if no acute issues or sooner based on Gyn/Dr.Secord visit. Will inform Drue Dun; Therapist, sports.

## 2016-12-29 ENCOUNTER — Other Ambulatory Visit: Payer: Self-pay | Admitting: Internal Medicine

## 2016-12-29 ENCOUNTER — Telehealth: Payer: Self-pay | Admitting: Internal Medicine

## 2016-12-29 DIAGNOSIS — C539 Malignant neoplasm of cervix uteri, unspecified: Secondary | ICD-10-CM

## 2016-12-29 NOTE — Telephone Encounter (Signed)
Kristen Simon- As per GYN oncology recommendation; I have ordered a PET scan.  Please inform the patient; and have rescheduled.

## 2017-01-01 NOTE — Telephone Encounter (Signed)
PET and appointment with Dr. Theora Gianotti have been arranged and Kristen Simon is aware of appointments.

## 2017-01-09 ENCOUNTER — Encounter
Admission: RE | Admit: 2017-01-09 | Discharge: 2017-01-09 | Disposition: A | Discharge status: Home / Self Care | Payer: BLUE CROSS/BLUE SHIELD | Source: Ambulatory Visit | Attending: Internal Medicine | Admitting: Internal Medicine

## 2017-01-09 DIAGNOSIS — C539 Malignant neoplasm of cervix uteri, unspecified: Secondary | ICD-10-CM

## 2017-01-09 LAB — GLUCOSE, CAPILLARY: Glucose-Capillary: 109 mg/dL — ABNORMAL HIGH (ref 65–99)

## 2017-01-09 MED ORDER — FLUDEOXYGLUCOSE F - 18 (FDG) INJECTION
13.0200 | Freq: Once | INTRAVENOUS | Status: AC | PRN
  Administered 2017-01-09: 13.02 via INTRAVENOUS

## 2017-01-10 ENCOUNTER — Other Ambulatory Visit: Payer: Self-pay | Admitting: *Deleted

## 2017-01-10 ENCOUNTER — Inpatient Hospital Stay (HOSPITAL_BASED_OUTPATIENT_CLINIC_OR_DEPARTMENT_OTHER): Payer: BLUE CROSS/BLUE SHIELD | Admitting: Obstetrics and Gynecology

## 2017-01-10 VITALS — BP 169/75 | HR 83 | Temp 98.1°F | Resp 20 | Ht 62.0 in | Wt 159.8 lb

## 2017-01-10 DIAGNOSIS — C774 Secondary and unspecified malignant neoplasm of inguinal and lower limb lymph nodes: Secondary | ICD-10-CM

## 2017-01-10 DIAGNOSIS — C801 Malignant (primary) neoplasm, unspecified: Secondary | ICD-10-CM

## 2017-01-10 DIAGNOSIS — G8929 Other chronic pain: Secondary | ICD-10-CM | POA: Diagnosis not present

## 2017-01-10 DIAGNOSIS — R1031 Right lower quadrant pain: Secondary | ICD-10-CM

## 2017-01-10 MED ORDER — HYDROCODONE-ACETAMINOPHEN 10-325 MG PO TABS: 0.5000 | ORAL_TABLET | Freq: Three times a day (TID) | ORAL | 0 refills | Status: DC | PRN

## 2017-01-10 NOTE — Progress Notes (Signed)
Pt here for f/u cervical cancer

## 2017-01-10 NOTE — Progress Notes (Signed)
Gynecologic Oncology Interval Visit   Referring Provider: Referred by Dr. Oliva Bustard.   Chief Concern: History of squamous carcinoma noted in a R inguinal node, presumed cervical cancer. She presents today for evaluation of pelvic pain.   Subjective:  Kristen Simon is a 66 y.o. female who is seen for continued surveillance for squamous carcinoma noted in a R inguinal node, presumed cervical cancer.    She was seen in clinic on 06/07/2016 and had a negative exam at that time. She was seen by Dr. Rogue Bussing on 12/22/2016 and he was concerned about increasing pain and right inguinal node measuring 1-2 cm. PET scan was ordered that was negative. She presents today for pelvic exam and repeat nodal assessment.   01/09/2017 PET scan IMPRESSION: 1. No findings of active malignancy in the neck, chest, abdomen, or pelvis. No definite hypermetabolic cervical mass is readily apparent. 2. Emphysema. 3. Old granulomatous disease. 4. Diffuse hepatic steatosis.  I saw Kristen Simon in 06/07/2016 and    She has a h/o persistent right lower quadrant abdominal pain, but this has been chronic since treatment and not worsened. She also complains of abdominal bloating. In the past she also  complainted of right groin discomfort that has able been long-standing. Her other complaints are fatigue, mild shortness of breath, intermittent constipation, back pain, and bilateral ankle swelling with prolonged standing.      Gynecologic Oncology  History Kristen Simon is a very pleasant patient with a history of squamous carcinoma noted in a R inguinal node, presumed metastatic cervical cancer.   12/2011        squamous carcinoma noted in a R inguinal node,CT /  Pet scan without definite primary,              anal exam WNL, pelvic exam normal, but incomplete due to discomfort, ECC neg,   tissue evaluation c/w/ cervical primary,                   chemo-XRT, completed NED 05/2012  Dr. Sabra Heck in August 2015 negative  exam and negative Pap smear.   Dr. Oliva Bustard March 2016 for a follow-up CT scan that was negative for evidence of recurrent disease.  10/21/2014 CT scan Abdomen and pelvis IMPRESSION: 1. No evidence of recurrent or metastatic disease. 2. Question mild hepatic steatosis. 3. Suspect a 2.5 cm uterine fibroid.  October 2016:Dr. Choksi for a follow-up and had a negative exam including breast exam. Her mammogram was negative.   12/08/2015 Pap NILM; HPV negative  12/17/2015 CT scan IMPRESSION: No evidence of metastatic disease.  Uterine fibroid. Hepatic steatosis.  01/14/2016: Kristen Nim NP  smoking cessation and lung cancer screening reviewed. She has quit smoking.   01/14/2016 CT Chest Lung Cancer Screening IMPRESSION: 1. Lung-Rads category 3, probably benign findings. Short-term follow-up in 6 months is recommended with repeat low-dose chest CT without contrast (please use the following order, "CT CHEST LCS NODULE FOLLOW-UP W/O CM"). 2. Mild diffuse bronchial wall thickening with mild centrilobular and paraseptal emphysema; imaging findings suggestive of underlying COPD. 3. Hepatic steatosis.   She has been NED.    Problem List: Patient Active Problem List   Diagnosis Date Noted  . Primary cervical cancer ([place]) 12/29/2016  . Personal history of tobacco use, presenting hazards to health 01/13/2016  . Benign essential HTN 01/29/2015  . Combined fat and carbohydrate induced hyperlipemia 01/29/2015  . Breathlessness on exertion 01/29/2015  . Cough 01/02/2014  . Secondary malignant neoplasm of lymph nodes  of inguinal region or lower extremity ([place])   . History of cancer chemotherapy     Past Medical History: Past Medical History:  Diagnosis Date  . Allergy   . Breast screening, unspecified   . Elevated cholesterol   . History of cancer chemotherapy 2013  . Hx of radiation therapy 2013  . Hypertension 2010  . Internal hemorrhoids with other complication 6734  . Personal  history of chemotherapy   . Personal history of radiation therapy   . Personal history of tobacco use, presenting hazards to health   . 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    lymphoma 2013  . Ulcer 1970   stomach-resolved  . Unilateral or unspecified femoral hernia with obstruction     Past Surgical History: Past Surgical History:  Procedure Laterality Date  . ANOSCOPY  03/27/2012   anal biopsy/hemorrhoid-benign  . APPENDECTOMY  1970  . FRACTURE SURGERY  1937,9024   right wrist,right leg  . GROIN MASS OPEN BIOPSY Right 01/24/2012   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  . HERNIA REPAIR Right 01/10/2012   right femerol hernia    Past Gynecologic History:   Gravida 1   Para 1   Age at Menarche 22   Age at Menopause 85   Regular Pap Smears Yes   Additional Hx patient does not recall any gyn problems in the past,    OB History:  OB History  Obstetric Comments  Age with first menstruation-9  LMP-age 74    Family History: Family History  Problem Relation Age of Onset  . Cancer Mother        lung  . Breast cancer Maternal Aunt     Social History: Social History   Social History  . Marital status: Married    Spouse name: N/A  . Number of children: N/A  . Years of education: N/A   Occupational History  . Not on file.   Social History Main Topics  . Smoking status: Former Smoker    Packs/day: 0.88    Years: 40.00    Quit date: 08/01/2015  . Smokeless tobacco: Never Used  . Alcohol use No  . Drug use: No  . Sexual activity: No   Other Topics Concern  . Not on file   Social History Narrative  . No narrative on file    Allergies: No Known Allergies  Current Medications: Current Outpatient Prescriptions  Medication Sig Dispense Refill  . albuterol (PROVENTIL HFA;VENTOLIN HFA) 108 (90 BASE) MCG/ACT inhaler Inhale into the lungs every 6 (six) hours  as needed for wheezing or shortness of breath.    Marland Kitchen amLODipine (NORVASC) 2.5 MG tablet Take 7.5 mg by mouth daily.     Marland Kitchen aspirin 81 MG tablet Take 81 mg by mouth daily.    . cyclobenzaprine (FLEXERIL) 5 MG tablet Take 5 mg by mouth daily as needed for muscle pain.    . fluticasone (FLONASE) 50 MCG/ACT nasal spray Place 2 sprays into the nose daily.    Marland Kitchen HYDROcodone-acetaminophen (NORCO) 10-325 MG tablet Take 0.5 tablets by mouth 4 (four) times daily as needed. 60 tablet 0  . Hyprom-Naphaz-Polysorb-Zn Sulf (CLEAR EYES COMPLETE OP) Apply 2 drops to eye daily as needed (itching).    . Lactobacillus ([employer] PROBIOTIC ACIDOPHILUS) 10 MG CAPS Take 1 tablet by mouth daily.    Marland Kitchen loratadine (CLARITIN REDITABS) 10 MG dissolvable tablet Take 10 mg by mouth daily.    Marland Kitchen  Magnesium Oxide (MAG-OX 400 PO) Take 1 tablet by mouth as needed for muscle spasms.    Marland Kitchen senna-docusate (SENOKOT-S) 8.6-50 MG tablet Take 1 tablet by mouth at bedtime as needed. 30 tablet 6   No current facility-administered medications for this visit.     Review of Systems General: fatigue Pulmonary: dyspnea Cardiac: negative Gastrointestinal: positive for abdominal pain and constipation, negative for, nausea, vomiting, diarrhea. She also complains of abdominal bloating. Genitourinary/Sexual: negative Ob/Gyn: negative for, irregular bleeding, pain Musculoskeletal:right groin pain, back pain, neck pain Neurologic/Psych: negative   Re: bilateral feet swelling, seasonal allergies  Objective:  Physical Examination:  BP (!) 169/75 (BP Location: Left Arm, Patient Position: Sitting)   Pulse 83   Temp 98.1 F (36.7 C) (Oral)   Resp 20   Ht 5\' 2"  (1.575 m)   Wt 159 lb 12.8 oz (72.5 kg)   BMI 29.23 kg/m    Body mass index is 29.23 kg/m.    ECOG Performance Status: 0 - Asymptomatic  General appearance: alert, cooperative and appears stated age HEENT:PERRLA, extra ocular movement intact and sclera clear, anicteric Lymph node  survey: non-palpable left inguinal node; on the right 1 cm palpable tender node adjacent to mons and approximately 2 cm away from midline  Abdomen: soft, 1-2 cm subcutaneous mass associated with appendectomy incision-tender, without intraabdominal masses or organomegaly, no hernias and well healed incision.  Back: inspection of back is normal Extremities: extremities normal, atraumatic, no cyanosis or edema  Neurological exam reveals alert, oriented, normal speech, no focal findings or movement disorder noted.  Pelvic: exam chaperoned by nurse;  Vulva: normal appearing vulva with no masses, tenderness or lesions; Vagina: normal vagina on prior exam she had a 1 cm nodule to the right of midline, but that was not appreciated on her last exam or today's exam; Adnexa: normal adnexa in size, nontender and no masses; Uterus: uterus very small size and nontender; Cervix: no lesions, very small and stenotic os; Rectal: normal rectal, no masses    Lab Review Labs on site today: n/a  Radiologic Imaging: As noted above    Assessment:  KATERI BALCH is a 66 y.o. female diagnosed with  squamous carcinoma noted in a R inguinal node, presumed cervical cancer s/p  Chemotherapy and radiation, clinically NED. Symptoms of right abdominal and groin pain, which are chronic and prior negative CT scan and negative PET scan. There are two areas of tenderness: one associated with incision in RLQ which may be scar tissue and the other a small right medial node (differential diagnosis includes recurrent disease but is unlikely given negative PET scan).   Musculoskeletal pain most likely due to DJD.   Lung-Rads category 3. Medical co-morbidities complicating care: history of tobacco use, now stopped. Overweight.  Plan:   We will follow up with radiology to see if either lesion is amendable to radiologic guided biopsy. If unable to biopsy we will continue to follow closely. We is scheduled to see me in Oct 2018 and  she will see Dr. Rogue Bussing in April 2019. At her next visit we can obtain a Pap (We previously discussed the questionable utility of Pap given the uncertainty whether the cervix was even the primary site of disease and limited utility of Pap for cervical cancer surveillance. After 5 years we can probably extend Pap intervals to 3-5 years if we continue. Typically Pap smears are followed for 20 years post diagnosis of cervical cancer. With the changing algorithms for cervical cancer screening these  recommendations may change.)    Given the concerns about subcutaneous and nodal lesions we will not release from clinic. In the future we can discuss release from [facility] and follow up with either her PCP, Dr. Maryland Pink has a nurse practitioner that can follow up on the pelvic exams. Imaging and laboratory assessment is based on clinical indication.   She will follow up with Dr. Kary Kos for her other medical needs.  And I recommended she follow up with him regarding her neck pain. Dr. Kary Kos will also be ordering her mammograms.   Suggested return to clinic in  5 months.    The patient's diagnosis, an outline of the further diagnostic and laboratory studies which will be required, the recommendation, and alternatives were discussed.  All questions were answered to the patient's satisfaction.   Gillis Ends, MD    CC:  Dr. Maryland Pink

## 2017-01-10 NOTE — Progress Notes (Signed)
  Oncology Nurse Navigator Documentation Chaperoned pelvic exam. Will speak with IR at case conference regarding possible biopsy. Navigator Location: [facility] (01/10/17 0800)   )                      Patient Visit Type: GynOnc (01/10/17 0800)                              Time Spent with Patient: 15 (01/10/17 0800)

## 2017-01-12 ENCOUNTER — Telehealth: Payer: Self-pay

## 2017-01-12 NOTE — Telephone Encounter (Signed)
  Oncology Nurse Navigator Documentation Attempted call to IR regarding possible biopsy of right inguinal area. Awaiting return call from Dr. Kathlene Cote. Navigator Location: [facility] (01/12/17 1400)   )Navigator Encounter Type: Telephone (01/12/17 1400) Telephone: [place] Call (01/12/17 1400)                                                  Time Spent with Patient: 15 (01/12/17 1400)

## 2017-01-12 NOTE — Telephone Encounter (Signed)
  Oncology Nurse Navigator Documentation Spoke with Dr. Kathlene Cote. At areas described by Dr. Theora Gianotti there is nothing amenable to biopsy. Areas are clear on PET scan. Ms Kristen Simon notified. She will follow up as scheduled or sooner as needed. Navigator Location: [facility] (01/12/17 1500)   )Navigator Encounter Type: Telephone;Diagnostic Results (01/12/17 1500)                                                    Time Spent with Patient: 15 (01/12/17 1500)

## 2017-01-24 DIAGNOSIS — Z85828 Personal history of other malignant neoplasm of skin: Secondary | ICD-10-CM

## 2017-01-24 HISTORY — DX: Personal history of other malignant neoplasm of skin: Z85.828

## 2017-01-30 ENCOUNTER — Other Ambulatory Visit: Payer: Self-pay | Admitting: *Deleted

## 2017-01-30 DIAGNOSIS — C774 Secondary and unspecified malignant neoplasm of inguinal and lower limb lymph nodes: Secondary | ICD-10-CM

## 2017-01-30 MED ORDER — HYDROCODONE-ACETAMINOPHEN 10-325 MG PO TABS: 0.5000 | ORAL_TABLET | Freq: Three times a day (TID) | ORAL | 0 refills | Status: DC | PRN

## 2017-02-13 ENCOUNTER — Telehealth: Payer: Self-pay | Admitting: *Deleted

## 2017-02-13 DIAGNOSIS — C774 Secondary and unspecified malignant neoplasm of inguinal and lower limb lymph nodes: Secondary | ICD-10-CM

## 2017-02-13 DIAGNOSIS — R52 Pain, unspecified: Secondary | ICD-10-CM

## 2017-02-13 MED ORDER — HYDROCODONE-ACETAMINOPHEN 10-325 MG PO TABS: 0.5000 | ORAL_TABLET | Freq: Three times a day (TID) | ORAL | 0 refills | Status: DC | PRN

## 2017-02-13 NOTE — Telephone Encounter (Signed)
Spoke with Dr. Janese Banks, MD agreed to provide a one time RF for Norco. RN explained to patient that if she runs out of the narcotic sooner than Monday, the oncologist would not provide additional refills. Pt gave verbal understanding of the plan of care. Patient understands that I will speak to Dr. Rogue Bussing on Monday 6/25 regarding long term pain medication. Pt understands that Dr. Janese Banks will proceed with pain mgmt referral.

## 2017-02-13 NOTE — Telephone Encounter (Signed)
-----   Message from [place] sent at 02/13/2017  1:42 PM EDT ----- Pt is stating she is still having pain in her abd constant per pt never goes away was told not cancer and wants to be seen,  told her that Aldean Ast was on vacation didn't know if she could see Theora Gianotti if it was related or not. Insurance runs out end of this month.

## 2017-02-13 NOTE — Addendum Note (Signed)
Addended by: Sabino Gasser on: 02/13/2017 04:06 PM   Modules accepted: Orders

## 2017-02-13 NOTE — Telephone Encounter (Signed)
Willing to write for 21 tab- 3 tab for 7 days. No refills. Dr. B can decide on Monday about further doses. Ok for pain management referral. PCP Dr. Kary Kos also needs to be in the loop regarding this and should be informed

## 2017-02-13 NOTE — Telephone Encounter (Signed)
Reviewed pt's chart. - also spoke with patient. Explained to patient that Dr. Theora Gianotti and Dr. Rogue Bussing did not see any signs of malignancy to cause her abd pain. Pt reports chronic back pain and chronic right lower quadrant pain. Pt reports that the right pelvic/groin pain radiates to lower back. Pt reports that Dr. Kary Kos had prescribed prednisone in the past for her back pain, which helped relieve her back pain. She states that Dr. Kary Kos mentioned "weaning me off the hydrocodone in the future and changing my medications to tramadol."  She was concerned that Dr. Jacinto Reap only wrote her for #30 tablets at the last office visit and not #60 as she had been written in the past. "I am using 0.5 tablets to one whole tablet every 8 hrs which is more frequently for me because of my chronic back pain. I only have enough tablets to last me until tomorrow at this time." pt requesting that oncologist write enough tablets to get her through until Dr. Rogue Bussing comes back from vacation. I explained to pt that Dr. Jacinto Reap only wrote for #30 as this chronic pain was not related to a cancer diagnosis. I did discuss that Dr. Theora Gianotti felt that the pain could be associated with scar tissue.  Dr. Theora Gianotti is not available tomorrow for an apt. Even then, Dr. Theora Gianotti is not the provider managing her pain. I explained to patient that I will discuss her care with the on call provider. Pt is interested in proceeding with a pain mgmt referral to manage her chronic pain. She stated that she would like to have an apt within the next few weeks with Dr. B to discuss her long term pain mgmt concerns.    Dr. Janese Banks, would you be willing to write for a limited RF on pt's Norco and pain mgmt referral?

## 2017-02-26 ENCOUNTER — Other Ambulatory Visit: Payer: Self-pay | Admitting: *Deleted

## 2017-02-26 DIAGNOSIS — C774 Secondary and unspecified malignant neoplasm of inguinal and lower limb lymph nodes: Secondary | ICD-10-CM

## 2017-02-26 DIAGNOSIS — R52 Pain, unspecified: Secondary | ICD-10-CM

## 2017-02-26 MED ORDER — HYDROCODONE-ACETAMINOPHEN 7.5-325 MG PO TABS: 1.0000 | ORAL_TABLET | Freq: Four times a day (QID) | ORAL | 0 refills | Status: DC | PRN

## 2017-02-26 MED ORDER — HYDROCODONE-ACETAMINOPHEN 10-325 MG PO TABS: 0.5000 | ORAL_TABLET | Freq: Three times a day (TID) | ORAL | 0 refills | Status: DC | PRN

## 2017-02-26 NOTE — Telephone Encounter (Signed)
md approved Norco 7.5/325. rx printed for md signature. md would only prescribe #30. MD agrees w/plan to continue to proceed with pain clinic referral.

## 2017-02-26 NOTE — Telephone Encounter (Signed)
Pt was referred to pain clinic a few weeks ago per Dr. Elroy Channel order. msg sent to scheduling team to determine the status on the referral to pain clinic.

## 2017-02-26 NOTE — Telephone Encounter (Signed)
Patient requesting an appt to see MD to discuss how she can come off of the [place]. She is also requesting a refill of her med, but would really like to see MD first. Please advise when she can be seen. She is due her refill tomorrow. Her next appt is not until November

## 2017-02-26 NOTE — Telephone Encounter (Signed)
Patient advised of Rx dose reduction and that rx is ready to pick up. Also advised she needs to keep pain clinic appt once scheduled

## 2017-03-09 ENCOUNTER — Other Ambulatory Visit: Payer: Self-pay | Admitting: *Deleted

## 2017-03-09 DIAGNOSIS — R52 Pain, unspecified: Secondary | ICD-10-CM

## 2017-03-09 DIAGNOSIS — C774 Secondary and unspecified malignant neoplasm of inguinal and lower limb lymph nodes: Secondary | ICD-10-CM

## 2017-03-09 MED ORDER — HYDROCODONE-ACETAMINOPHEN 7.5-325 MG PO TABS: ORAL_TABLET | ORAL | 0 refills | Status: DC

## 2017-03-09 NOTE — Telephone Encounter (Signed)
Per Dr Rogue Bussing, this is the last refill he will be giving her for pain med as there is no cancer seen in any of her tests. She will have to contact her PCP for any future refills. Patient advised that this is the last refill she will get from our office and she said that is fine, she is starting with pain clinic. I also advised her that this Rx for for 1/2 tab every 6 hours. She repeated this back to me

## 2017-03-19 ENCOUNTER — Encounter: Payer: Self-pay | Admitting: Nurse Practitioner

## 2017-03-19 ENCOUNTER — Ambulatory Visit: Payer: Medicare Other | Attending: Nurse Practitioner | Admitting: Nurse Practitioner

## 2017-03-19 VITALS — BP 163/74 | HR 97 | Temp 97.5°F | Resp 20 | Ht 62.0 in | Wt 159.0 lb

## 2017-03-19 DIAGNOSIS — I129 Hypertensive chronic kidney disease with stage 1 through stage 4 chronic kidney disease, or unspecified chronic kidney disease: Secondary | ICD-10-CM | POA: Insufficient documentation

## 2017-03-19 DIAGNOSIS — Z7982 Long term (current) use of aspirin: Secondary | ICD-10-CM | POA: Insufficient documentation

## 2017-03-19 DIAGNOSIS — Z87891 Personal history of nicotine dependence: Secondary | ICD-10-CM | POA: Insufficient documentation

## 2017-03-19 DIAGNOSIS — Z79899 Other long term (current) drug therapy: Secondary | ICD-10-CM | POA: Diagnosis not present

## 2017-03-19 DIAGNOSIS — F1190 Opioid use, unspecified, uncomplicated: Secondary | ICD-10-CM | POA: Insufficient documentation

## 2017-03-19 DIAGNOSIS — M79605 Pain in left leg: Secondary | ICD-10-CM

## 2017-03-19 DIAGNOSIS — E782 Mixed hyperlipidemia: Secondary | ICD-10-CM | POA: Insufficient documentation

## 2017-03-19 DIAGNOSIS — N189 Chronic kidney disease, unspecified: Secondary | ICD-10-CM | POA: Diagnosis not present

## 2017-03-19 DIAGNOSIS — C539 Malignant neoplasm of cervix uteri, unspecified: Secondary | ICD-10-CM | POA: Diagnosis not present

## 2017-03-19 DIAGNOSIS — R1030 Lower abdominal pain, unspecified: Secondary | ICD-10-CM | POA: Diagnosis not present

## 2017-03-19 DIAGNOSIS — Z9221 Personal history of antineoplastic chemotherapy: Secondary | ICD-10-CM | POA: Diagnosis not present

## 2017-03-19 DIAGNOSIS — R1031 Right lower quadrant pain: Secondary | ICD-10-CM

## 2017-03-19 DIAGNOSIS — M533 Sacrococcygeal disorders, not elsewhere classified: Secondary | ICD-10-CM | POA: Diagnosis not present

## 2017-03-19 DIAGNOSIS — Z79891 Long term (current) use of opiate analgesic: Secondary | ICD-10-CM | POA: Diagnosis not present

## 2017-03-19 DIAGNOSIS — K4190 Unilateral femoral hernia, without obstruction or gangrene, not specified as recurrent: Secondary | ICD-10-CM | POA: Diagnosis not present

## 2017-03-19 DIAGNOSIS — M79606 Pain in leg, unspecified: Secondary | ICD-10-CM | POA: Insufficient documentation

## 2017-03-19 DIAGNOSIS — M542 Cervicalgia: Secondary | ICD-10-CM

## 2017-03-19 DIAGNOSIS — M545 Low back pain, unspecified: Secondary | ICD-10-CM | POA: Insufficient documentation

## 2017-03-19 DIAGNOSIS — C8590 Non-Hodgkin lymphoma, unspecified, unspecified site: Secondary | ICD-10-CM | POA: Diagnosis not present

## 2017-03-19 DIAGNOSIS — R1032 Left lower quadrant pain: Secondary | ICD-10-CM

## 2017-03-19 DIAGNOSIS — Z923 Personal history of irradiation: Secondary | ICD-10-CM | POA: Insufficient documentation

## 2017-03-19 DIAGNOSIS — M79604 Pain in right leg: Secondary | ICD-10-CM

## 2017-03-19 DIAGNOSIS — G8929 Other chronic pain: Secondary | ICD-10-CM

## 2017-03-19 DIAGNOSIS — G894 Chronic pain syndrome: Secondary | ICD-10-CM | POA: Diagnosis present

## 2017-03-19 NOTE — Progress Notes (Signed)
Patient's Name: Kristen Simon  MRN: [id number]  Referring Provider: Sindy Guadeloupe, MD  DOB: 03-16-1951  PCP: [place] Pink, MD  DOS: 03/19/2017  Note by: Dionisio David NP  Service setting: Ambulatory outpatient  Specialty: Interventional Pain Management  Location: [facility] (AMB) Pain Management Facility    Patient type: New Patient    Primary Reason(s) for Visit: Initial Patient Evaluation CC: Groin Pain (right (post radiation pain-lymphoma)); Abdominal Pain (right low (also post radiation pain)); Neck Pain; and Back Pain (low and right SI)  HPI  Kristen Simon is a 66 y.o. year old, female patient, who comes today for an initial evaluation. 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 ([place]); History of cancer chemotherapy; Benign essential HTN; Cough; Mixed hyperlipidemia; Shortness of breath; Personal history of tobacco use, presenting hazards to health; Primary cervical cancer ([place]); Long term current use of opiate analgesic; Long term prescription opiate use; Opiate use; Chronic pain syndrome; Chronic low back pain, unspecified back pain laterality, with sciatica presence unspecified (primary) (bilateral) (R>L); Chronic neck pain; Pain in both lower extremities (tertiary)(R>L); Bilateral groin pain (secondary) (R>L); and Sacroiliac joint pain on her problem list.. Her primarily concern today is the Groin Pain (right (post radiation pain-lymphoma)); Abdominal Pain (right low (also post radiation pain)); Neck Pain; and Back Pain (low and right SI)  Pain Assessment: Location: Right, Lower Abdomen Radiating: right groin Onset: More than a month ago Duration: Chronic pain (post radiation 2013) Quality: Stabbing, Sharp, Aching, Dull Severity: 3 /10 (self-reported pain score)  Note: Reported level is compatible with observation.                   Effect on ADL:   Timing: Constant Modifying factors: medications, heat helps low back  Onset and Duration:  Sudden, Date of onset: Fall 2012 and Present longer than 3 months Cause of pain: Fall Severity: Getting worse, NAS-11 at its worse: 7/10, NAS-11 at its best: 2/10, NAS-11 now: 3/10 and NAS-11 on the average: 5/10 Timing: Not influenced by the time of the day and After activity or exercise Aggravating Factors: Bending, Climbing, Prolonged sitting, Prolonged standing, Surgery made it worse and Walking uphill Alleviating Factors: Stretching, Cold packs, Hot packs, Medications and Walking Associated Problems: Constipation, Day-time cramps, Night-time cramps, Dizziness, Numbness, Spasms, Sweating, Swelling, Tingling, Pain that wakes patient up and Pain that does not allow patient to sleep Quality of Pain: Aching, Sharp, Shooting, Tender, Toothache-like and Uncomfortable Previous Examinations or Tests: Biopsy, CT scan, MRI scan, X-rays and Chiropractic evaluation Previous Treatments: Facet blocks, Steroid treatments by mouth and Trigger point injections  The patient comes into the clinics today for the first time for a chronic pain management evaluation. According to the patient her primary area of pain is in her low back. She admits that the precipitating factor with the radiation treatment for lymphoma. She admits the pain is greater on the right. The pain radiates into her groin area which is her second area of pain. She does have some lower extremity pain that radiates into her thigh which is our third area of pain. She denies any lumbar spinal surgery, interventional procedures. She has had past physical therapy. She admits that she has had a recent PET and CT scan.  She admits that she does have some neck pain. She states that the pain is midline. She denies any radicular symptoms. She denies any surgeries, interventional therapy, physical therapy or recent images.  Today I took  the time to provide the patient with information regarding this pain practice. The patient was informed that the practice is  divided into two sections: an interventional pain management section, as well as a completely separate and distinct medication management section. I explained that there are procedure days for interventional therapies, and evaluation days for follow-ups and medication management. Because of the amount of documentation required during both, they are kept separated. This means that there is the possibility that she may be scheduled for a procedure on one day, and medication management the next. I have also informed her that because of staffing and facility limitations, this practice will no longer take patients for medication management only. To illustrate the reasons for this, I gave the patient the example of surgeons, and how inappropriate it would be to refer a patient to his/her care, just to write for the post-surgical antibiotics on a surgery done by a different surgeon.   Because interventional pain management is part of the board-certified specialty for the doctors, the patient was informed that joining this practice means that they are open to any and all interventional therapies. I made it clear that this does not mean that they will be forced to have any procedures done. What this means is that I believe interventional therapies to be essential part of the diagnosis and proper management of chronic pain conditions. Therefore, patients not interested in these interventional alternatives will be better served under the care of a different practitioner.  The patient was also made aware of my Comprehensive Pain Management Safety Guidelines where by joining this practice, they limit all of their nerve blocks and joint injections to those done by our practice, for as long as we are retained to manage their care. Historic Controlled Substance Pharmacotherapy Review  PMP and historical list of controlled substances: Hydrocodone/acetaminophen 7.5/325 mg, hydrocodone/acetaminophen 10/325 mg, Lyrica 75 mg,  guaifenesin codeine syrup, alprazolam 0.5 mg, hydrocodone/acetaminophen 7.5/500 mg, hydrocodone/acetaminophen 10/650 mg, Highest opioid analgesic regimen found: Hydrocodone/acetaminophen 10/325 mg 5 times daily (fill date 03/12/2012) (hydrocodone 50 mg per day) Most recent opioid analgesic: Hydrocodone/acetaminophen 7.5/325 mg twice daily (fill date 03/10/2017) (hydrocodone 15 mg per day) Current opioid analgesics:  Hydrocodone/acetaminophen 7.5/325 mg twice daily (fill date 03/10/2017) (hydrocodone 15 mg per day) Highest recorded MME/day: 50 mg/day MME/day: 15 mg/day Medications: The patient did not bring the medication(s) to the appointment, as requested in our "New Patient Package" Pharmacodynamics: Desired effects: Analgesia: The patient reports >50% benefit. Reported improvement in function: The patient reports medication allows her to accomplish basic ADLs. Clinically meaningful improvement in function (CMIF): Sustained CMIF goals met Perceived effectiveness: Described as relatively effective, allowing for increase in activities of daily living (ADL) Undesirable effects: Side-effects or Adverse reactions: None reported Historical Monitoring: The patient  reports that she does not use drugs. List of all UDS Test(s): No results found for: MDMA, COCAINSCRNUR, PCPSCRNUR, PCPQUANT, CANNABQUANT, THCU, [place] List of all Serum Drug Screening Test(s):  No results found for: AMPHSCRSER, BARBSCRSER, BENZOSCRSER, COCAINSCRSER, PCPSCRSER, PCPQUANT, THCSCRSER, CANNABQUANT, OPIATESCRSER, OXYSCRSER, PROPOXSCRSER Historical Background Evaluation: [place] PDMP: Six (6) year initial data search conducted.             [place] Department of public safety, offender search: Editor, commissioning Information) Non-contributory Risk Assessment Profile: Aberrant behavior: None observed or detected today Risk factors for fatal opioid overdose: caucasian Fatal overdose hazard ratio (HR): 1.92 for 50-99 MME/day Non-fatal overdose hazard  ratio (HR): 1.44 for 20-49 MME/day Risk of opioid abuse or dependence: 0.7-3.0% with  doses ? 36 MME/day and 6.1-26% with doses ? 120 MME/day. Substance use disorder (SUD) risk level: Pending results of Medical Psychology Evaluation for SUD Opioid risk tool (ORT) (Total Score): 1  ORT Scoring interpretation table:  Score <3 = Low Risk for SUD  Score between 4-7 = Moderate Risk for SUD  Score >8 = High Risk for Opioid Abuse   PHQ-2 Depression Scale:  Total score: 0  PHQ-2 Scoring interpretation table: (Score and probability of major depressive disorder)  Score 0 = No depression  Score 1 = 15.4% Probability  Score 2 = 21.1% Probability  Score 3 = 38.4% Probability  Score 4 = 45.5% Probability  Score 5 = 56.4% Probability  Score 6 = 78.6% Probability   PHQ-9 Depression Scale:  Total score: 0  PHQ-9 Scoring interpretation table:  Score 0-4 = No depression  Score 5-9 = Mild depression  Score 10-14 = Moderate depression  Score 15-19 = Moderately severe depression  Score 20-27 = Severe depression (2.4 times higher risk of SUD and 2.89 times higher risk of overuse)   Pharmacologic Plan: Pending ordered tests and/or consults  Meds  The patient has a current medication list which includes the following prescription(s): albuterol, amlodipine, aspirin, cyclobenzaprine, fluticasone, hydrocodone-acetaminophen, cvs probiotic acidophilus, loratadine, magnesium oxide, and senna-docusate.  Current Outpatient Prescriptions on File Prior to Visit  Medication Sig  . albuterol (PROVENTIL HFA;VENTOLIN HFA) 108 (90 BASE) MCG/ACT inhaler Inhale into the lungs every 6 (six) hours as needed for wheezing or shortness of breath.  Marland Kitchen amLODipine (NORVASC) 2.5 MG tablet Take 7.5 mg by mouth daily.   Marland Kitchen aspirin 81 MG tablet Take 81 mg by mouth daily.  . cyclobenzaprine (FLEXERIL) 5 MG tablet Take 5 mg by mouth daily as needed for muscle pain.  . fluticasone (FLONASE) 50 MCG/ACT nasal spray Place 2 sprays into  the nose daily.  Marland Kitchen HYDROcodone-acetaminophen (NORCO) 7.5-325 MG tablet Take one half a  tablet every 6 hours as needed for pain  . Lactobacillus (CVS PROBIOTIC ACIDOPHILUS) 10 MG CAPS Take 1 tablet by mouth daily.  Marland Kitchen loratadine (CLARITIN REDITABS) 10 MG dissolvable tablet Take 10 mg by mouth daily.  . Magnesium Oxide (MAG-OX 400 PO) Take 1 tablet by mouth as needed for muscle spasms.  Marland Kitchen senna-docusate (SENOKOT-S) 8.6-50 MG tablet Take 1 tablet by mouth at bedtime as needed.   No current facility-administered medications on file prior to visit.    Imaging Review  Lumbosacral Imaging:  Lumbar MR w/wo contrast:  Results for orders placed in visit on 08/01/13  MR Lumbar Spine W Wo Contrast   Narrative * PRIOR REPORT IMPORTED FROM AN EXTERNAL SYSTEM *   CLINICAL DATA:  Right buttock pain. History of squamous cell cancer  removed from the right groin in May 2013. Abnormal activity at L5 on  the right on bone scan of 08/04/2013   EXAM:  MRI LUMBAR SPINE WITHOUT AND WITH CONTRAST   TECHNIQUE:  Multiplanar and multiecho pulse sequences of the lumbar spine were  obtained without and with intravenous contrast.   CONTRAST:  13 cc MultiHance   COMPARISON:  None.   FINDINGS:  Normal conus tip at L1-2. Normal paraspinal soft tissues. Small  benign appearing cysts on both kidneys.   The patient has moderately severe right facet arthritis at L5-S1  with hypertrophy and cystic degenerative changes. There is abnormal  enhancement around that degenerated facet joint after contrast  administration. This correlates with the area of abnormal activity  on bone  scan. The finding is not suggestive of metastatic disease.   T11-12 through L2-3:  Normal.   L3-4: Small broad-based disc bulge slightly asymmetric to the right  minimally indenting the anterior aspect of the thecal sac with no  neural impingement.   L4-5:  Small broad-based disc bulge without neural impingement.   L5-S1:  Normal  disc.  Moderately severe right facet arthritis.   IMPRESSION:  1. Moderately severe right facet arthritis at L5-S1 which correlates  with the area of abnormal activity on bone scan.  2. No evidence of metastatic disease in the lumbar spine.  3. Degenerative disc disease at L3-4 without neural impingement.    Electronically Signed    By: Rozetta Nunnery M.D.    On: 08/15/2013 16:55       Note: Available results from prior imaging studies were reviewed.        ROS  Cardiovascular History: Daily Aspirin intake, High blood pressure and Blood thinners:  Antiplatelet Pulmonary or Respiratory History: Shortness of breath and Coughing up mucus (Bronchitis) Neurological History: No reported neurological signs or symptoms such as seizures, abnormal skin sensations, urinary and/or fecal incontinence, being born with an abnormal open spine and/or a tethered spinal cord Review of Past Neurological Studies:  Results for orders placed or performed during the hospital encounter of 12/04/15  CT Head Wo Contrast   Narrative   CLINICAL DATA:  Pt c/o episodes of frontal HAs x 77month. This Am presented with severe HTN "systolic was over 2638. Denies hx CVA, sz, CA.  EXAM: CT HEAD WITHOUT CONTRAST  TECHNIQUE: Contiguous axial images were obtained from the base of the skull through the vertex without intravenous contrast.  COMPARISON:  Brain MRI, 08/13/2013  FINDINGS: The ventricles are normal in size and configuration.  There are no parenchymal masses or mass effect. There is no evidence of an infarct. There are no extra-axial masses or abnormal fluid collections.  There is no intracranial hemorrhage.  The visualized sinuses and mastoid air cells are clear.  IMPRESSION: 1. Normal unenhanced CT scan of the brain for age.   Electronically Signed   By: DLajean ManesM.D.   On: 12/04/2015 14:47   Results for orders placed or performed in visit on 08/01/13  MR Brain W Wo Contrast    Narrative   * PRIOR REPORT IMPORTED FROM AN EXTERNAL SYSTEM *  CLINICAL DATA:  66year old female with history of squamous cell  carcinoma and abnormal right skullbase finding on bone scan done for  pain. Staging. Subsequent encounter.   EXAM:  MRI HEAD WITHOUT AND WITH CONTRAST   TECHNIQUE:  Multiplanar, multiecho pulse sequences of the brain and surrounding  structures were obtained without and with intravenous contrast.   CONTRAST:  13 mL MultiHance.   COMPARISON:  Whole-body bone scan 08/04/2013.   FINDINGS:  There is a subtle focal area of altered bone marrow signal in the  right occipital bone, non expansile and measuring up to 16 mm in  length (series 5, image 6, series 8, image 22). No associated  diffusion abnormality identified (series 100, image 6). No definite  postcontrast enhancement (series 11, image 22).   Elsewhere visible bone marrow signal is within normal limits.   No abnormal enhancement of the brain or dura. No midline shift, mass  effect, or evidence of intracranial mass lesion.   No restricted diffusion to suggest acute infarction. No  ventriculomegaly, extra-axial collection or acute intracranial  hemorrhage. Cervicomedullary junction and pituitary are within  normal limits. Negative visualized cervical spine. Cerebral volume  within normal limits. Minimal to mild for age small scattered  cerebral white matter foci of T2 and FLAIR hyperintensity,  configuration non specific. Otherwise normal gray and white matter  signal.   Visualized orbit soft tissues are within normal limits. Visualized  paranasal sinuses and mastoids are clear. Visible internal auditory  structures appear normal. Visualized scalp soft tissues are within  normal limits.   IMPRESSION:  1. There is subtle altered bone marrow signal in a 16 mm area of the  right occipital bone which could correspond to the recent bone scan  findings. However, the appearance is indeterminate and  at this time  I favor a benign bone lesion over a metastasis. Imaging surveillance  is recommended.  2. No metastatic disease to the brain or acute intracranial  abnormality identified.  3. Minimal to mild for age nonspecific white matter signal changes.    Electronically Signed    By: Lars Pinks M.D.    On: 08/13/2013 16:57       Psychological-Psychiatric History: No reported psychological or psychiatric signs or symptoms such as difficulty sleeping, anxiety, depression, delusions or hallucinations (schizophrenial), mood swings (bipolar disorders) or suicidal ideations or attempts Gastrointestinal History: Vomiting blood (Ulcers) and Irregular, infrequent bowel movements (Constipation) Genitourinary History: No reported renal or genitourinary signs or symptoms such as difficulty voiding or producing urine, peeing blood, non-functioning kidney, kidney stones, difficulty emptying the bladder, difficulty controlling the flow of urine, or chronic kidney disease Hematological History: No reported hematological signs or symptoms such as prolonged bleeding, low or poor functioning platelets, bruising or bleeding easily, hereditary bleeding problems, low energy levels due to low hemoglobin or being anemic Endocrine History: No reported endocrine signs or symptoms such as high or low blood sugar, rapid heart rate due to high thyroid levels, obesity or weight gain due to slow thyroid or thyroid disease Rheumatologic History: No reported rheumatological signs and symptoms such as fatigue, joint pain, tenderness, swelling, redness, heat, stiffness, decreased range of motion, with or without associated rash Musculoskeletal History: Negative for myasthenia gravis, muscular dystrophy, multiple sclerosis or malignant hyperthermia Work History: Retired  Allergies  [name] has No Known Allergies.  Laboratory Chemistry  Inflammation Markers Lab Results  Component Value Date   CRP 3.0 03/19/2017    ESRSEDRATE 7 03/19/2017   (CRP: Acute Phase) (ESR: Chronic Phase) Renal Function Markers Lab Results  Component Value Date   BUN 9 03/19/2017   CREATININE 0.65 03/19/2017   GFRAA 106 03/19/2017   GFRNONAA 92 03/19/2017   Hepatic Function Markers Lab Results  Component Value Date   AST 29 03/19/2017   ALT 37 (H) 03/19/2017   ALBUMIN 4.9 (H) 03/19/2017   ALKPHOS 51 03/19/2017   Electrolytes Lab Results  Component Value Date   NA 140 03/19/2017   K 4.5 03/19/2017   CL 102 03/19/2017   CALCIUM 9.9 03/19/2017   MG 1.7 03/19/2017   Neuropathy Markers Lab Results  Component Value Date   VITAMINB12 WILL FOLLOW 03/19/2017   Bone Pathology Markers Lab Results  Component Value Date   ALKPHOS 51 03/19/2017   25OHVITD1 WILL FOLLOW 03/19/2017   25OHVITD2 WILL FOLLOW 03/19/2017   25OHVITD3 WILL FOLLOW 03/19/2017   CALCIUM 9.9 03/19/2017   Coagulation Parameters Lab Results  Component Value Date   PLT 260 12/22/2016   Cardiovascular Markers Lab Results  Component Value Date   HGB 13.9 12/22/2016   HCT 39.2 12/22/2016  Note: Lab results reviewed.  PFSH  Drug: Kristen Simon  reports that she does not use drugs. Alcohol:  reports that she does not drink alcohol. Tobacco:  reports that she quit smoking about 19 months ago. She has a 35.20 pack-year smoking history. She has never used smokeless tobacco. Medical:  has a past medical history of Allergy; Breast screening, unspecified; Elevated cholesterol; History of cancer chemotherapy (2013); radiation therapy (2013); Hypertension (2010); Internal hemorrhoids with other complication (4166); Personal history of chemotherapy; Personal history of radiation therapy; Personal history of tobacco use, presenting hazards to health; 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; Ulcer (1970); and Unilateral or unspecified femoral hernia with obstruction. Family: family history includes Breast  cancer in her maternal aunt; Cancer in her mother.  Past Surgical History:  Procedure Laterality Date  . ANOSCOPY  03/27/2012   anal biopsy/hemorrhoid-benign  . APPENDECTOMY  1970  . FRACTURE SURGERY  0630,1601   right wrist,right leg  . GROIN MASS OPEN BIOPSY Right 01/24/2012   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  . HERNIA REPAIR Right 01/10/2012   right femerol hernia  . PATELLA FRACTURE SURGERY Right 1982   Active Ambulatory Problems    Diagnosis Date Noted  . 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 History of cancer chemotherapy   . Benign essential HTN 01/29/2015  . Cough 01/02/2014  . Mixed hyperlipidemia 01/29/2015  . Shortness of breath 01/29/2015  . Personal history of tobacco use, presenting hazards to health 01/13/2016  . Primary cervical cancer ([place]) 12/29/2016  . Long term current use of opiate analgesic 03/19/2017  . Long term prescription opiate use 03/19/2017  . Opiate use 03/19/2017  . Chronic pain syndrome 03/19/2017  . Chronic low back pain, unspecified back pain laterality, with sciatica presence unspecified (primary) (bilateral) (R>L) 03/19/2017  . Chronic neck pain 03/19/2017  . Pain in both lower extremities (tertiary)(R>L) 03/19/2017  . Bilateral groin pain (secondary) (R>L) 03/19/2017  . Sacroiliac joint pain 03/19/2017   Resolved Ambulatory Problems    Diagnosis Date Noted  . No Resolved Ambulatory Problems   Past Medical History:  Diagnosis Date  . Allergy   . Breast screening, unspecified   . Elevated cholesterol   . History of cancer chemotherapy 2013  . Hx of radiation therapy 2013  . Hypertension 2010  . Internal hemorrhoids with other complication 0932  . Personal history of chemotherapy   . Personal history of radiation therapy   . Personal history of tobacco use, presenting hazards to health   . Secondary and unspecified malignant neoplasm of  lymph nodes of inguinal region and lower limb   . Ulcer 1970  . Unilateral or unspecified femoral hernia with obstruction    Constitutional Exam  General appearance: Well nourished, well developed, and well hydrated. In no apparent acute distress Vitals:   03/19/17 1006  BP: (!) 163/74  Pulse: 97  Resp: 20  Temp: (!) 97.5 F (36.4 C)  TempSrc: Oral  SpO2: 97%  Weight: 159 lb (72.1 kg)  Height: _0  (1.575 m)   BMI Assessment: Estimated body mass index is 29.08 kg/m as calculated from the following:   Height as of this encounter: _1  (1.575 m).   Weight as of this encounter: 159 lb (72.1 kg).  BMI interpretation table: BMI level Category Range association with higher incidence of chronic pain  <18 kg/m2 Underweight  18.5-24.9 kg/m2 Ideal body weight   25-29.9 kg/m2 Overweight Increased incidence by 20%  30-34.9 kg/m2 Obese (Class I) Increased incidence by 68%  35-39.9 kg/m2 Severe obesity (Class II) Increased incidence by 136%  >40 kg/m2 Extreme obesity (Class III) Increased incidence by 254%   BMI Readings from Last 4 Encounters:  03/19/17 29.08 kg/m  01/10/17 29.23 kg/m  12/28/16 29.45 kg/m  12/22/16 30.00 kg/m   Wt Readings from Last 4 Encounters:  03/19/17 159 lb (72.1 kg)  01/10/17 159 lb 12.8 oz (72.5 kg)  12/28/16 161 lb (73 kg)  12/22/16 164 lb (74.4 kg)  Psych/Mental status: Alert, oriented x 3 (person, place, & time)       Eyes: PERLA Respiratory: No evidence of acute respiratory distress  Cervical Spine Exam  Inspection: No masses, redness, or swelling Alignment: Asymmetric Functional ROM: Decreased ROM      Stability: No instability detected Muscle strength & Tone: Functionally intact Sensory: Unimpaired Palpation: No palpable anomalies              Upper Extremity (UE) Exam    Side: Right upper extremity  Side: Left upper extremity  Inspection: No masses, redness, swelling, or asymmetry. No contractures  Inspection: No masses, redness,  swelling, or asymmetry. No contractures  Functional ROM: Unrestricted ROM          Functional ROM: Unrestricted ROM          Muscle strength & Tone: Functionally intact  Muscle strength & Tone: Functionally intact  Sensory: Unimpaired  Sensory: Unimpaired  Palpation: No palpable anomalies              Palpation: No palpable anomalies              Specialized Test(s): Deferred         Specialized Test(s): Deferred          Thoracic Spine Exam  Inspection: No masses, redness, or swelling Alignment: Symmetrical Functional ROM: Unrestricted ROM Stability: No instability detected Sensory: Unimpaired Muscle strength & Tone: No palpable anomalies  Lumbar Spine Exam  Inspection: No masses, redness, or swelling Alignment: Symmetrical Functional ROM: Unrestricted ROM      Stability: No instability detected Muscle strength & Tone: Functionally intact Sensory: Unimpaired Palpation: Complains of area being tender to palpation       Provocative Tests: Lumbar Hyperextension and rotation test: Positive bilaterally for facet joint pain. Patrick's Maneuver: Positive for bilateral S-I arthralgia and for right hip arthralgia  Gait & Posture Assessment  Ambulation: Unassisted Gait: Relatively normal for age and body habitus Posture: WNL   Lower Extremity Exam    Side: Right lower extremity  Side: Left lower extremity  Inspection: No masses, redness, swelling, or asymmetry. No contractures  Inspection: No masses, redness, swelling, or asymmetry. No contractures  Functional ROM: Unrestricted ROM          Functional ROM: Unrestricted ROM          Muscle strength & Tone: Functionally intact  Muscle strength & Tone: Functionally intact  Sensory: Unimpaired  Sensory: Unimpaired  Palpation: No palpable anomalies  Palpation: No palpable anomalies   Assessment  Primary Diagnosis & Pertinent Problem List: The primary encounter diagnosis was Chronic low back pain, unspecified back pain laterality, with  sciatica presence unspecified (primary) (bilateral) (R>L). Diagnoses of Bilateral groin pain (secondary) (R>L), Pain in both lower extremities (tertiary)(R>L), Chronic neck pain, Sacroiliac joint pain, Chronic pain syndrome, and Long term current use of opiate analgesic were also pertinent to  this visit.  Visit Diagnosis: 1. Chronic low back pain, unspecified back pain laterality, with sciatica presence unspecified (primary) (bilateral) (R>L)   2. Bilateral groin pain (secondary) (R>L)   3. Pain in both lower extremities (tertiary)(R>L)   4. Chronic neck pain   5. Sacroiliac joint pain   6. Chronic pain syndrome   7. Long term current use of opiate analgesic    Plan of Care  Initial treatment plan:  Please be advised that as per protocol, today's visit has been an evaluation only. We have not taken over the patient's controlled substance management.  Problem-specific plan: No problem-specific Assessment & Plan notes found for this encounter.  Ordered Lab-work, Procedure(s), Referral(s), & Consult(s): Orders Placed This Encounter  Procedures  . DG Lumbar Spine Complete W/Bend  . DG Si Joints  . DG HIP UNILAT W OR W/O PELVIS 2-3 VIEWS RIGHT  . DG Cervical Spine Complete  . Compliance Drug Analysis, Ur  . Magnesium  . 25-Hydroxyvitamin D Lcms D2+D3  . Vitamin B12  . Comprehensive metabolic panel  . C-reactive protein  . Sedimentation rate  . Ambulatory referral to Psychology   Pharmacotherapy: Medications ordered:  No orders of the defined types were placed in this encounter.  Medications administered during this visit: Kristen Simon had no medications administered during this visit.   Pharmacotherapy under consideration:  Opioid Analgesics: The patient was informed that there is no guarantee that she would be a candidate for opioid analgesics. The decision will be made following CDC guidelines. This decision will be based on the results of diagnostic studies, as well as Ms.  Evans's risk profile.  Membrane stabilizer: To be determined at a later time Muscle relaxant: To be determined at a later time NSAID: To be determined at a later time Other analgesic(s): To be determined at a later time   Interventional therapies under consideration: Kristen Simon was informed that there is no guarantee that she would be a candidate for interventional therapies. The decision will be based on the results of diagnostic studies, as well as [name]'s risk profile.  Possible procedure(s): Possible diagnostic right lumbar epidural steroid injection Possible diagnostic right lumbar facet block Possible diagnostic right lumbar radiofrequency ablation Possible diagnostic bilateral sacroiliac joint injection Possible diagnostic right hip injection Possible diagnostic bilateral cervical facet block Possible diagnostic bilateral cervical radiofrequency ablation    Provider-requested follow-up: Return for 2nd Visit, w/ Dr. Dossie Arbour, after [facility] eval.  Future Appointments Date Time Provider [place]  06/06/2017 10:00 AM [facility] None  07/02/2017 10:45 AM [facility] LAB [facility] None  07/02/2017 11:00 AM Cammie Sickle, MD [facility] None    Primary Care Physician: [place] Pink, MD Location: [facility] Note by:  Date: 03/19/2017; Time: 2:19 PM  Pain Score Disclaimer: We use the NRS-11 scale. This is a self-reported, subjective measurement of pain severity with only modest accuracy. It is used primarily to identify changes within a particular patient. It must be understood that outpatient pain scales are significantly less accurate that those used for research, where they can be applied under ideal controlled circumstances with minimal exposure to variables. In reality, the score is likely to be a combination of pain intensity and pain affect, where pain affect describes the degree of emotional arousal or changes in  action readiness caused by the sensory experience of pain. Factors such as social and work situation, setting, emotional state, anxiety levels, expectation, and prior pain experience may influence pain perception and  show large inter-individual differences that may also be affected by time variables.  Patient instructions provided during this appointment: Patient Instructions    ____________________________________________________________________________________________  Appointment Policy Summary  It is our goal and responsibility to provide the medical community with assistance in the evaluation and management of patients with chronic pain. Unfortunately our resources are limited. Because we do not have an unlimited amount of time, or available appointments, we are required to closely monitor and manage their use. The following rules exist to maximize their use:  Patient's responsibilities: 1. Punctuality:  At what time should I arrive? You should be physically present in our office 30 minutes before your scheduled appointment. Your scheduled appointment is with your assigned healthcare provider. However, it takes 5-10 minutes to be "checked-in", and another 15 minutes for the nurses to do the admission. If you arrive to our office at the time you were given for your appointment, you will end up being at least 20-25 minutes late to your appointment with the provider. 2. Tardiness:  What happens if I arrive only a few minutes after my scheduled appointment time? You will need to reschedule your appointment. The cutoff is your appointment time. This is why it is so important that you arrive at least 30 minutes before that appointment. If you have an appointment scheduled for 10:00 AM and you arrive at 10:01, you will be required to reschedule your appointment.  3. Plan ahead:  Always assume that you will encounter traffic on your way in. Plan for it. If you are dependent on a driver, make sure they  understand these rules and the need to arrive early. 4. Other appointments and responsibilities:  Avoid scheduling any other appointments before or after your pain clinic appointments.  5. Be prepared:  Write down everything that you need to discuss with your healthcare provider and give this information to the admitting nurse. Write down the medications that you will need refilled. Bring your pills and bottles (even the empty ones), to all of your appointments, except for those where a procedure is scheduled. 6. No children or pets:  Find someone to take care of them. It is not appropriate to bring them in. 7. Scheduling changes:  We request "advanced notification" of any changes or cancellations. 8. Advanced notification:  Defined as a time period of more than 24 hours prior to the originally scheduled appointment. This allows for the appointment to be offered to other patients. 9. Rescheduling:  When a visit is rescheduled, it will require the cancellation of the original appointment. For this reason they both fall within the category of "Cancellations".  10. Cancellations:  They require advanced notification. Any cancellation less than 24 hours before the  appointment will be recorded as a "No Show". 11. No Show:  Defined as an unkept appointment where the patient failed to notify or declare to the practice their intention or inability to keep the appointment.  Corrective process for repeat offenders:  1. Tardiness: Three (3) episodes of rescheduling due to late arrivals will be recorded as one (1) "No Show". 2. Cancellation or reschedule: Three (3) cancellations or rescheduling will be recorded as one (1) "No Show". 3. "No Shows": Three (3) "No Shows" within a 12 month period will result in discharge from the  practice.  ____________________________________________________________________________________________  ____________________________________________________________________________________________  Pain Scale  Introduction: The pain score used by this practice is the Verbal Numerical Rating Scale (VNRS-11). This is an 11-point scale. It is for adults and children 10  years or older. There are significant differences in how the pain score is reported, used, and applied. Forget everything you learned in the past and learn this scoring system.  General Information: The scale should reflect your current level of pain. Unless you are specifically asked for the level of your worst pain, or your average pain. If you are asked for one of these two, then it should be understood that it is over the past 24 hours.  Basic Activities of Daily Living (ADL): Personal hygiene, dressing, eating, transferring, and using restroom.  Instructions: Most patients tend to report their level of pain as a combination of two factors, their physical pain and their psychosocial pain. This last one is also known as "suffering" and it is reflection of how physical pain affects you socially and psychologically. From now on, report them separately. From this point on, when asked to report your pain level, report only your physical pain. Use the following table for reference.  Pain Clinic Pain Levels (0-5/10)  Pain Level Score  Description  No Pain 0   Mild pain 1 Nagging, annoying, but does not interfere with basic activities of daily living (ADL). Patients are able to eat, bathe, get dressed, toileting (being able to get on and off the toilet and perform personal hygiene functions), transfer (move in and out of bed or a chair without assistance), and maintain continence (able to control bladder and bowel functions). Blood pressure and heart rate are unaffected. A normal heart rate for a healthy adult ranges from 60 to 100 bpm  (beats per minute).   Mild to moderate pain 2 Noticeable and distracting. Impossible to hide from other people. More frequent flare-ups. Still possible to adapt and function close to normal. It can be very annoying and may have occasional stronger flare-ups. With discipline, patients may get used to it and adapt.   Moderate pain 3 Interferes significantly with activities of daily living (ADL). It becomes difficult to feed, bathe, get dressed, get on and off the toilet or to perform personal hygiene functions. Difficult to get in and out of bed or a chair without assistance. Very distracting. With effort, it can be ignored when deeply involved in activities.   Moderately severe pain 4 Impossible to ignore for more than a few minutes. With effort, patients may still be able to manage work or participate in some social activities. Very difficult to concentrate. Signs of autonomic nervous system discharge are evident: dilated pupils (mydriasis); mild sweating (diaphoresis); sleep interference. Heart rate becomes elevated (>115 bpm). Diastolic blood pressure (lower number) rises above 100 mmHg. Patients find relief in laying down and not moving.   Severe pain 5 Intense and extremely unpleasant. Associated with frowning face and frequent crying. Pain overwhelms the senses.  Ability to do any activity or maintain social relationships becomes significantly limited. Conversation becomes difficult. Pacing back and forth is common, as getting into a comfortable position is nearly impossible. Pain wakes you up from deep sleep. Physical signs will be obvious: pupillary dilation; increased sweating; goosebumps; brisk reflexes; cold, clammy hands and feet; nausea, vomiting or dry heaves; loss of appetite; significant sleep disturbance with inability to fall asleep or to remain asleep. When persistent, significant weight loss is observed due to the complete loss of appetite and sleep deprivation.  Blood pressure and heart  rate becomes significantly elevated. Caution: If elevated blood pressure triggers a pounding headache associated with blurred vision, then the patient should immediately seek attention at an urgent or  emergency care unit, as these may be signs of an impending stroke.    Emergency Department Pain Levels (6-10/10)  Emergency Room Pain 6 Severely limiting. Requires emergency care and should not be seen or managed at an outpatient pain management facility. Communication becomes difficult and requires great effort. Assistance to reach the emergency department may be required. Facial flushing and profuse sweating along with potentially dangerous increases in heart rate and blood pressure will be evident.   Distressing pain 7 Self-care is very difficult. Assistance is required to transport, or use restroom. Assistance to reach the emergency department will be required. Tasks requiring coordination, such as bathing and getting dressed become very difficult.   Disabling pain 8 Self-care is no longer possible. At this level, pain is disabling. The individual is unable to do even the most "basic" activities such as walking, eating, bathing, dressing, transferring to a bed, or toileting. Fine motor skills are lost. It is difficult to think clearly.   Incapacitating pain 9 Pain becomes incapacitating. Thought processing is no longer possible. Difficult to remember your own name. Control of movement and coordination are lost.   The worst pain imaginable 10 At this level, most patients pass out from pain. When this level is reached, collapse of the autonomic nervous system occurs, leading to a sudden drop in blood pressure and heart rate. This in turn results in a temporary and dramatic drop in blood flow to the brain, leading to a loss of consciousness. Fainting is one of the body's self defense mechanisms. Passing out puts the brain in a calmed state and causes it to shut down for a while, in order to begin the  healing process.    Summary: 1. Refer to this scale when providing [place] with your pain level. 2. Be accurate and careful when reporting your pain level. This will help with your care. 3. Over-reporting your pain level will lead to loss of credibility. 4. Even a level of 1/10 means that there is pain and will be treated at our facility. 5. High, inaccurate reporting will be documented as "Symptom Exaggeration", leading to loss of credibility and suspicions of possible secondary gains such as obtaining more narcotics, or wanting to appear disabled, for fraudulent reasons. 6. Only pain levels of 5 or below will be seen at our facility. 7. Pain levels of 6 and above will be sent to the Emergency Department and the appointment cancelled. ____________________________________________________________________________________________

## 2017-03-19 NOTE — Patient Instructions (Signed)

## 2017-03-19 NOTE — Progress Notes (Signed)
Safety precautions to be maintained throughout the outpatient stay will include: orient to surroundings, keep bed in low position, maintain call bell within reach at all times, provide assistance with transfer out of bed and ambulation.  

## 2017-03-24 LAB — COMPLIANCE DRUG ANALYSIS, UR

## 2017-03-25 LAB — COMPREHENSIVE METABOLIC PANEL
ALK PHOS: 51 IU/L (ref 39–117)
ALT: 37 IU/L — ABNORMAL HIGH (ref 0–32)
AST: 29 IU/L (ref 0–40)
Albumin/Globulin Ratio: 2 (ref 1.2–2.2)
Albumin: 4.9 g/dL — ABNORMAL HIGH (ref 3.6–4.8)
BUN/Creatinine Ratio: 14 (ref 12–28)
BUN: 9 mg/dL (ref 8–27)
Bilirubin Total: 0.3 mg/dL (ref 0.0–1.2)
CALCIUM: 9.9 mg/dL (ref 8.7–10.3)
CO2: 23 mmol/L (ref 20–29)
CREATININE: 0.65 mg/dL (ref 0.57–1.00)
Chloride: 102 mmol/L (ref 96–106)
GFR calc Af Amer: 106 mL/min/{1.73_m2} (ref >59)
GFR, EST NON AFRICAN AMERICAN: 92 mL/min/{1.73_m2} (ref >59)
GLUCOSE: 118 mg/dL — AB (ref 65–99)
Globulin, Total: 2.4 g/dL (ref 1.5–4.5)
Potassium: 4.5 mmol/L (ref 3.5–5.2)
Sodium: 140 mmol/L (ref 134–144)
TOTAL PROTEIN: 7.3 g/dL (ref 6.0–8.5)

## 2017-03-25 LAB — SEDIMENTATION RATE: Sed Rate: 7 mm/hr (ref 0–40)

## 2017-03-25 LAB — MAGNESIUM: Magnesium: 1.7 mg/dL (ref 1.6–2.3)

## 2017-03-25 LAB — 25-HYDROXY VITAMIN D LCMS D2+D3
25-Hydroxy, Vitamin D-3: 23 ng/mL
25-Hydroxy, Vitamin D: 24 ng/mL — ABNORMAL LOW

## 2017-03-25 LAB — 25-HYDROXYVITAMIN D LCMS D2+D3: 25-HYDROXY, VITAMIN D-2: 1.1 ng/mL

## 2017-03-25 LAB — C-REACTIVE PROTEIN: CRP: 3 mg/L (ref 0.0–4.9)

## 2017-03-25 LAB — VITAMIN B12: Vitamin B-12: 364 pg/mL (ref 232–1245)

## 2017-03-26 ENCOUNTER — Telehealth: Payer: Self-pay | Admitting: *Deleted

## 2017-03-26 NOTE — Telephone Encounter (Signed)
Patient called requesting pain medication refill. Dr. Janeth Rase has referred this patient to the pain clinic for pain management. Writer advised patient to contact PCP for pain medications if needed.

## 2017-03-26 NOTE — Telephone Encounter (Signed)
Hey Crystal, I fwd you this msg as patient has contacted our office requesting for pain medication. - Renita Papa, RN

## 2017-06-06 ENCOUNTER — Ambulatory Visit: Payer: BLUE CROSS/BLUE SHIELD

## 2017-06-13 ENCOUNTER — Ambulatory Visit: Payer: BLUE CROSS/BLUE SHIELD

## 2017-06-20 ENCOUNTER — Ambulatory Visit: Payer: Self-pay

## 2017-07-02 ENCOUNTER — Inpatient Hospital Stay: Payer: Medicare Other | Attending: Internal Medicine

## 2017-07-02 ENCOUNTER — Inpatient Hospital Stay (HOSPITAL_BASED_OUTPATIENT_CLINIC_OR_DEPARTMENT_OTHER): Payer: Medicare Other | Admitting: Internal Medicine

## 2017-07-02 VITALS — BP 138/78 | HR 86 | Temp 97.8°F | Resp 20 | Ht 62.0 in | Wt 161.0 lb

## 2017-07-02 DIAGNOSIS — Z8572 Personal history of non-Hodgkin lymphomas: Secondary | ICD-10-CM | POA: Insufficient documentation

## 2017-07-02 DIAGNOSIS — R1031 Right lower quadrant pain: Secondary | ICD-10-CM

## 2017-07-02 DIAGNOSIS — Z801 Family history of malignant neoplasm of trachea, bronchus and lung: Secondary | ICD-10-CM | POA: Insufficient documentation

## 2017-07-02 DIAGNOSIS — C774 Secondary and unspecified malignant neoplasm of inguinal and lower limb lymph nodes: Secondary | ICD-10-CM

## 2017-07-02 DIAGNOSIS — Z9221 Personal history of antineoplastic chemotherapy: Secondary | ICD-10-CM

## 2017-07-02 DIAGNOSIS — Z87891 Personal history of nicotine dependence: Secondary | ICD-10-CM | POA: Insufficient documentation

## 2017-07-02 DIAGNOSIS — Z79899 Other long term (current) drug therapy: Secondary | ICD-10-CM | POA: Insufficient documentation

## 2017-07-02 DIAGNOSIS — Z803 Family history of malignant neoplasm of breast: Secondary | ICD-10-CM | POA: Insufficient documentation

## 2017-07-02 DIAGNOSIS — I10 Essential (primary) hypertension: Secondary | ICD-10-CM | POA: Insufficient documentation

## 2017-07-02 DIAGNOSIS — C801 Malignant (primary) neoplasm, unspecified: Secondary | ICD-10-CM | POA: Diagnosis not present

## 2017-07-02 LAB — COMPREHENSIVE METABOLIC PANEL
ALBUMIN: 4.4 g/dL (ref 3.5–5.0)
ALK PHOS: 51 U/L (ref 38–126)
ALT: 29 U/L (ref 14–54)
ANION GAP: 8 (ref 5–15)
AST: 28 U/L (ref 15–41)
BILIRUBIN TOTAL: 0.7 mg/dL (ref 0.3–1.2)
BUN: 14 mg/dL (ref 6–20)
CALCIUM: 9.1 mg/dL (ref 8.9–10.3)
CO2: 24 mmol/L (ref 22–32)
CREATININE: 0.62 mg/dL (ref 0.44–1.00)
Chloride: 104 mmol/L (ref 101–111)
GFR calc Af Amer: >60 mL/min (ref >60)
GFR calc non Af Amer: >60 mL/min (ref >60)
GLUCOSE: 108 mg/dL — AB (ref 65–99)
Potassium: 4.3 mmol/L (ref 3.5–5.1)
Sodium: 136 mmol/L (ref 135–145)
TOTAL PROTEIN: 7.3 g/dL (ref 6.5–8.1)

## 2017-07-02 LAB — CBC WITH DIFFERENTIAL/PLATELET
BASOS ABS: 0 10*3/uL (ref 0–0.1)
BASOS PCT: 0 %
EOS ABS: 0.1 10*3/uL (ref 0–0.7)
EOS PCT: 2 %
HEMATOCRIT: 39.4 % (ref 35.0–47.0)
Hemoglobin: 13.6 g/dL (ref 12.0–16.0)
Lymphocytes Relative: 30 %
Lymphs Abs: 2.2 10*3/uL (ref 1.0–3.6)
MCH: 30.6 pg (ref 26.0–34.0)
MCHC: 34.5 g/dL (ref 32.0–36.0)
MCV: 88.5 fL (ref 80.0–100.0)
MONO ABS: 0.7 10*3/uL (ref 0.2–0.9)
MONOS PCT: 9 %
NEUTROS ABS: 4.3 10*3/uL (ref 1.4–6.5)
Neutrophils Relative %: 59 %
PLATELETS: 251 10*3/uL (ref 150–440)
RBC: 4.45 MIL/uL (ref 3.80–5.20)
RDW: 13.2 % (ref 11.5–14.5)
WBC: 7.3 10*3/uL (ref 3.6–11.0)

## 2017-07-02 NOTE — Assessment & Plan Note (Addendum)
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 MAY 2018- CT scan of the abdomen and pelvis does not show any pelvic or inguinal adenopathy [however, C discussion below]  # ~ 3 x 2 cm mass-incidental cervix versus uterine fibroid. However, June 2018- PET scan- not abnormal. Status post evaluation with gynecology oncology. Recommend keeping the appointments with gynecology oncology.  # Follow-up with me in 6 months/labs.

## 2017-07-02 NOTE — Progress Notes (Signed)
[place] OFFICE PROGRESS NOTE  Patient Care Team: Maryland Pink, MD as PCP - General (Family Medicine) Clent Jacks, RN as Registered Nurse  Cancer Staging No matching staging information was found for the patient.   Oncology History   # squamous carcinoma noted in a R inguinal node, presumed cervical cancer s/p  Chemotherapy and radiation     Secondary malignant neoplasm of lymph nodes of inguinal region or lower extremity ([place])    Initial Diagnosis    Secondary malignant neoplasm of lymph nodes of inguinal region or lower extremity ([place])        INTERVAL HISTORY:  Kristen Simon 66 y.o.  female pleasant patient above history of Carcinoma noted of the right inguinal region- presumed cervical primary- status post chemoradiation is here for follow-up.  Patient continues to have a vague sense of discomfort in the right groin; however she is currently only taking Tylenol for pain. She denies any new lumps or bumps. Denies any lumps getting worse.  She denies any other specific complaints. He denies any unusual shortness of breath or cough. She missed her gynecology oncology appointment; interested in making an appointment soon.  REVIEW OF SYSTEMS:  A complete 10 point review of system is done which is negative except mentioned above/history of present illness.   PAST MEDICAL HISTORY :  Past Medical History:  Diagnosis Date  . Allergy   . Breast screening, unspecified   . Elevated cholesterol   . History of cancer chemotherapy 2013  . Hx of radiation therapy 2013  . Hypertension 2010  . Internal hemorrhoids with other complication 3474  . Personal history of chemotherapy   . Personal history of radiation therapy   . Personal history of tobacco use, presenting hazards to health   . 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    lymphoma 2013  . Ulcer 1970   stomach-resolved  . Unilateral or  unspecified femoral hernia with obstruction     PAST SURGICAL HISTORY :   Past Surgical History:  Procedure Laterality Date  . ANOSCOPY  03/27/2012   anal biopsy/hemorrhoid-benign  . APPENDECTOMY  1970  . FRACTURE SURGERY  [id number]   right wrist,right leg  . GROIN MASS OPEN BIOPSY Right 01/24/2012   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  . HERNIA REPAIR Right 01/10/2012   right femerol hernia  . PATELLA FRACTURE SURGERY Right 1982    FAMILY HISTORY :   Family History  Problem Relation Age of Onset  . Cancer Mother        lung  . Breast cancer Maternal Aunt     SOCIAL HISTORY:   Social History   Tobacco Use  . Smoking status: Former Smoker    Packs/day: 0.88    Years: 40.00    Pack years: 35.20    Last attempt to quit: 08/01/2015    Years since quitting: 1.9  . Smokeless tobacco: Never Used  Substance Use Topics  . Alcohol use: No  . Drug use: No    ALLERGIES:  has No Known Allergies.  MEDICATIONS:  Current Outpatient Medications  Medication Sig Dispense Refill  . Acetaminophen (TYLENOL ARTHRITIS PAIN PO) Take 650 mg 3 (three) times daily by mouth.    Marland Kitchen albuterol (PROVENTIL HFA;VENTOLIN HFA) 108 (90 BASE) MCG/ACT inhaler Inhale into the lungs every 6 (six) hours as needed for wheezing or shortness of breath.    Marland Kitchen amLODipine (NORVASC) 2.5  MG tablet Take 7.5 mg by mouth daily.     Marland Kitchen aspirin 81 MG tablet Take 81 mg by mouth daily.    . Calcium Carbonate-Vit D-Min (CALTRATE 600+D PLUS MINERALS) 600-800 MG-UNIT CHEW Chew 1 tablet 3 (three) times a week by mouth.    . fluticasone (FLONASE) 50 MCG/ACT nasal spray Place 2 sprays into the nose daily.    . Lactobacillus (CVS PROBIOTIC ACIDOPHILUS) 10 MG CAPS Take 1 tablet by mouth daily.    Marland Kitchen loratadine (CLARITIN REDITABS) 10 MG dissolvable tablet Take 10 mg by mouth daily.    Marland Kitchen senna-docusate (SENOKOT-S) 8.6-50 MG tablet Take 1 tablet by mouth at bedtime as needed. 30 tablet 6  .  cyclobenzaprine (FLEXERIL) 5 MG tablet Take 5 mg by mouth daily as needed for muscle pain.     No current facility-administered medications for this visit.     PHYSICAL EXAMINATION: ECOG PERFORMANCE STATUS: 1 - Symptomatic but completely ambulatory  BP 138/78 (BP Location: Left Arm, Patient Position: Sitting)   Pulse 86   Temp 97.8 F (36.6 C) (Tympanic)   Resp 20   Ht 5\' 2"  (1.575 m)   Wt 161 lb (73 kg)   BMI 29.45 kg/m   Filed Weights   07/02/17 1051  Weight: 161 lb (73 kg)    GENERAL: Well-nourished well-developed; Alert, no distress and comfortable.   Accompanied by her husband. Obese.  EYES: no pallor or icterus OROPHARYNX: no thrush or ulceration; NECK: supple, no masses felt LYMPH:  no palpable lymphadenopathy in the cervical, axillaryregion.  Right inguinal region approximately 1-2 cm lump noted [lymph node versus scar tissue- chronic]  LUNGS: clear to auscultation and  No wheeze or crackles HEART/CVS: regular rate & rhythm and no murmurs; No lower extremity edema ABDOMEN:abdomen soft, ? Tenderness  right lower quadrant inguinal region [? chronic] and normal bowel sounds Musculoskeletal:no cyanosis of digits and no clubbing  PSYCH: alert & oriented x 3 with fluent speech NEURO: no focal motor/sensory deficits SKIN:  no rashes or significant lesions  LABORATORY DATA:  I have reviewed the data as listed    Component Value Date/Time   NA 136 07/02/2017 1026   NA 140 03/19/2017 1131   NA 137 12/09/2014 1114   K 4.3 07/02/2017 1026   K 3.7 12/09/2014 1114   CL 104 07/02/2017 1026   CL 103 12/09/2014 1114   CO2 24 07/02/2017 1026   CO2 27 12/09/2014 1114   GLUCOSE 108 (H) 07/02/2017 1026   GLUCOSE 130 (H) 12/09/2014 1114   BUN 14 07/02/2017 1026   BUN 9 03/19/2017 1131   BUN 10 12/09/2014 1114   CREATININE 0.62 07/02/2017 1026   CREATININE 0.62 12/09/2014 1114   CALCIUM 9.1 07/02/2017 1026   CALCIUM 9.3 12/09/2014 1114   PROT 7.3 07/02/2017 1026   PROT 7.3  03/19/2017 1131   PROT 7.2 12/09/2014 1114   ALBUMIN 4.4 07/02/2017 1026   ALBUMIN 4.9 (H) 03/19/2017 1131   ALBUMIN 4.4 12/09/2014 1114   AST 28 07/02/2017 1026   AST 31 12/09/2014 1114   ALT 29 07/02/2017 1026   ALT 35 12/09/2014 1114   ALKPHOS 51 07/02/2017 1026   ALKPHOS 47 12/09/2014 1114   BILITOT 0.7 07/02/2017 1026   BILITOT 0.3 03/19/2017 1131   BILITOT 0.6 12/09/2014 1114   GFRNONAA >60 07/02/2017 1026   GFRNONAA >60 12/09/2014 1114   GFRAA >60 07/02/2017 1026   GFRAA >60 12/09/2014 1114    No results found for: SPEP,  UPEP  Lab Results  Component Value Date   WBC 7.3 07/02/2017   NEUTROABS 4.3 07/02/2017   HGB 13.6 07/02/2017   HCT 39.4 07/02/2017   MCV 88.5 07/02/2017   PLT 251 07/02/2017      Chemistry      Component Value Date/Time   NA 136 07/02/2017 1026   NA 140 03/19/2017 1131   NA 137 12/09/2014 1114   K 4.3 07/02/2017 1026   K 3.7 12/09/2014 1114   CL 104 07/02/2017 1026   CL 103 12/09/2014 1114   CO2 24 07/02/2017 1026   CO2 27 12/09/2014 1114   BUN 14 07/02/2017 1026   BUN 9 03/19/2017 1131   BUN 10 12/09/2014 1114   CREATININE 0.62 07/02/2017 1026   CREATININE 0.62 12/09/2014 1114      Component Value Date/Time   CALCIUM 9.1 07/02/2017 1026   CALCIUM 9.3 12/09/2014 1114   ALKPHOS 51 07/02/2017 1026   ALKPHOS 47 12/09/2014 1114   AST 28 07/02/2017 1026   AST 31 12/09/2014 1114   ALT 29 07/02/2017 1026   ALT 35 12/09/2014 1114   BILITOT 0.7 07/02/2017 1026   BILITOT 0.3 03/19/2017 1131   BILITOT 0.6 12/09/2014 1114       RADIOGRAPHIC STUDIES: I have personally reviewed the radiological images as listed and agreed with the findings in the report. No results found.   ASSESSMENT & PLAN:  Secondary malignant neoplasm of lymph nodes of inguinal region or lower extremity ([place]) Right inguinal node squamous cell carcinoma- question cervical primary status post chemoradiation. MAY 2018- CT scan of the abdomen and pelvis does not show  any pelvic or inguinal adenopathy [however, C discussion below]  # ~ 3 x 2 cm mass-incidental cervix versus uterine fibroid. However, June 2018- PET scan- not abnormal. Status post evaluation with gynecology oncology. Recommend keeping the appointments with gynecology oncology.  # Follow-up with me in 6 months/labs.    Orders Placed This Encounter  Procedures  . CBC with Differential/Platelet    Standing Status:   Future    Standing Expiration Date:   07/02/2018  . Comprehensive metabolic panel    Standing Status:   Future    Standing Expiration Date:   07/02/2018   All questions were answered. The patient knows to call the clinic with any problems, questions or concerns.      Cammie Sickle, MD 07/02/2017 1:30 PM

## 2017-08-29 ENCOUNTER — Other Ambulatory Visit: Payer: Self-pay | Admitting: Family Medicine

## 2017-08-29 DIAGNOSIS — Z1231 Encounter for screening mammogram for malignant neoplasm of breast: Secondary | ICD-10-CM

## 2017-09-13 DIAGNOSIS — M5136 Other intervertebral disc degeneration, lumbar region: Secondary | ICD-10-CM | POA: Diagnosis not present

## 2017-09-13 DIAGNOSIS — M5441 Lumbago with sciatica, right side: Secondary | ICD-10-CM | POA: Diagnosis not present

## 2017-09-13 DIAGNOSIS — M7061 Trochanteric bursitis, right hip: Secondary | ICD-10-CM | POA: Diagnosis not present

## 2017-09-14 ENCOUNTER — Ambulatory Visit
Admission: RE | Admit: 2017-09-14 | Discharge: 2017-09-14 | Disposition: A | Discharge status: Home / Self Care | Payer: 59 | Source: Ambulatory Visit | Attending: Family Medicine | Admitting: Family Medicine

## 2017-09-14 DIAGNOSIS — Z1231 Encounter for screening mammogram for malignant neoplasm of breast: Secondary | ICD-10-CM | POA: Diagnosis not present

## 2017-09-19 DIAGNOSIS — Z1283 Encounter for screening for malignant neoplasm of skin: Secondary | ICD-10-CM | POA: Diagnosis not present

## 2017-09-19 DIAGNOSIS — Z85828 Personal history of other malignant neoplasm of skin: Secondary | ICD-10-CM | POA: Diagnosis not present

## 2017-09-19 DIAGNOSIS — L821 Other seborrheic keratosis: Secondary | ICD-10-CM | POA: Diagnosis not present

## 2017-10-23 NOTE — Progress Notes (Signed)
Gynecologic Oncology Interval Visit   Referring Provider: Referred by Dr. Wende Bushy. Rogue Bussing   Chief Concern: History of squamous carcinoma noted in a R inguinal node, presumed cervical cancer.   Subjective:  Kristen Simon is a 67 y.o. female who is seen for continued surveillance for squamous carcinoma noted in a R inguinal node, presumed cervical cancer.    She was seen in clinic on 12/31/2016 and had a negative exam at that time. She was seen by Dr. Rogue Bussing on 07/02/2017 and had a negative exam. She presents today for pelvic exam, Pap, and repeat nodal assessment.   She had an episode of rectal bleeding that she felt was due to hemorrhoids 2 months.    Gynecologic Oncology  History Kristen Simon is a very pleasant patient with a history of squamous carcinoma noted in a R inguinal node, presumed metastatic cervical cancer.   12/2011        squamous carcinoma noted in a R inguinal node,CT /  Pet scan without definite primary,              anal exam WNL, pelvic exam normal, but incomplete due to discomfort, ECC neg,   tissue evaluation c/w/ cervical primary,                   chemo-XRT, completed NED 05/2012  Dr. Sabra Heck in August 2015 negative exam and negative Pap smear.   Dr. Oliva Bustard March 2016 for a follow-up CT scan that was negative for evidence of recurrent disease.  10/21/2014 CT scan Abdomen and pelvis IMPRESSION: 1. No evidence of recurrent or metastatic disease. 2. Question mild hepatic steatosis. 3. Suspect a 2.5 cm uterine fibroid.  October 2016:Dr. Choksi for a follow-up and had a negative exam including breast exam. Her mammogram was negative.   12/08/2015 Pap NILM; HPV negative  12/17/2015 CT scan IMPRESSION: No evidence of metastatic disease.  Uterine fibroid. Hepatic steatosis.  01/14/2016: Kristen Nim NP  smoking cessation and lung cancer screening reviewed. She has quit smoking.   01/14/2016 CT Chest Lung Cancer Screening IMPRESSION: 1.  Lung-Rads category 3, probably benign findings. Short-term follow-up in 6 months is recommended with repeat low-dose chest CT without contrast (please use the following order, "CT CHEST LCS NODULE FOLLOW-UP W/O CM"). 2. Mild diffuse bronchial wall thickening with mild centrilobular and paraseptal emphysema; imaging findings suggestive of underlying COPD. 3. Hepatic steatosis.  01/09/2017 PET scan IMPRESSION: 1. No findings of active malignancy in the neck, chest, abdomen, or pelvis. No definite hypermetabolic cervical mass is readily apparent. 2. Emphysema. 3. Old granulomatous disease. 4. Diffuse hepatic steatosis.  She has been NED.    Problem List: Patient Active Problem List   Diagnosis Date Noted  . Long term current use of opiate analgesic 03/19/2017  . Long term prescription opiate use 03/19/2017  . Opiate use 03/19/2017  . Chronic pain syndrome 03/19/2017  . Chronic low back pain, unspecified back pain laterality, with sciatica presence unspecified (primary) (bilateral) (R>L) 03/19/2017  . Chronic neck pain 03/19/2017  . Pain in both lower extremities (tertiary)(R>L) 03/19/2017  . Bilateral groin pain (secondary) (R>L) 03/19/2017  . Sacroiliac joint pain 03/19/2017  . Primary cervical cancer ([place]) 12/29/2016  . Personal history of tobacco use, presenting hazards to health 01/13/2016  . Benign essential HTN 01/29/2015  . Mixed hyperlipidemia 01/29/2015  . Shortness of breath 01/29/2015  . Cough 01/02/2014  . Secondary malignant neoplasm of lymph nodes of inguinal region or lower extremity ([place])   .  History of cancer chemotherapy     Past Medical History: Past Medical History:  Diagnosis Date  . Allergy   . Breast screening, unspecified   . Elevated cholesterol   . History of cancer chemotherapy 2013  . Hx of radiation therapy 2013  . Hypertension 2010  . Internal hemorrhoids with other complication 2694  . Personal history of chemotherapy   . Personal history of  radiation therapy   . Personal history of tobacco use, presenting hazards to health   . 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    lymphoma 2013  . Ulcer 1970   stomach-resolved  . Unilateral or unspecified femoral hernia with obstruction     Past Surgical History: Past Surgical History:  Procedure Laterality Date  . ANOSCOPY  03/27/2012   anal biopsy/hemorrhoid-benign  . APPENDECTOMY  1970  . FRACTURE SURGERY  [id number]   right wrist,right leg  . GROIN MASS OPEN BIOPSY Right 01/24/2012   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  . HERNIA REPAIR Right 01/10/2012   right femerol hernia  . PATELLA FRACTURE SURGERY Right 1982    Past Gynecologic History:   Gravida 1   Para 1   Age at Menarche 58   Age at Menopause 12   Regular Pap Smears Yes   Additional Hx patient does not recall any gyn problems in the past    OB History:  OB History  Obstetric Comments  Age with first menstruation-9  LMP-age 69    Family History: Family History  Problem Relation Age of Onset  . Cancer Mother        lung  . Breast cancer Maternal Aunt     Social History: Social History   Socioeconomic History  . Marital status: Married    Spouse name: Not on file  . Number of children: Not on file  . Years of education: Not on file  . Highest education level: Not on file  Social Needs  . Financial resource strain: Not on file  . Food insecurity - worry: Not on file  . Food insecurity - inability: Not on file  . Transportation needs - medical: Not on file  . Transportation needs - non-medical: Not on file  Occupational History  . Not on file  Tobacco Use  . Smoking status: Former Smoker    Packs/day: 0.88    Years: 40.00    Pack years: 35.20    Last attempt to quit: 08/01/2015    Years since quitting: 2.2  . Smokeless tobacco: Never Used  Substance and Sexual Activity  . Alcohol use:  No  . Drug use: No  . Sexual activity: No  Other Topics Concern  . Not on file  Social History Narrative  . Not on file    Allergies: No Known Allergies  Current Medications: Current Outpatient Medications  Medication Sig Dispense Refill  . albuterol (PROVENTIL HFA;VENTOLIN HFA) 108 (90 BASE) MCG/ACT inhaler Inhale into the lungs every 6 (six) hours as needed for wheezing or shortness of breath.    Marland Kitchen amLODipine (NORVASC) 2.5 MG tablet Take 7.5 mg by mouth daily.     Marland Kitchen aspirin 81 MG tablet Take 81 mg by mouth daily.    . Calcium Carbonate-Vit D-Min (CALTRATE 600+D PLUS MINERALS) 600-800 MG-UNIT CHEW Chew 1 tablet 3 (three) times a week by mouth.    . cyclobenzaprine (FLEXERIL) 5 MG tablet Take 5 mg by mouth daily as needed  for muscle pain.    . fluticasone (FLONASE) 50 MCG/ACT nasal spray Place 2 sprays into the nose daily.    . Lactobacillus (CVS PROBIOTIC ACIDOPHILUS) 10 MG CAPS Take 1 tablet by mouth daily.    Marland Kitchen loratadine (CLARITIN REDITABS) 10 MG dissolvable tablet Take 10 mg by mouth daily.    Marland Kitchen senna-docusate (SENOKOT-S) 8.6-50 MG tablet Take 1 tablet by mouth at bedtime as needed. 30 tablet 6  . Acetaminophen (TYLENOL ARTHRITIS PAIN PO) Take 650 mg 3 (three) times daily by mouth.     No current facility-administered medications for this visit.     Review of Systems General: changes in weight or appetite (weight gain) Pulmonary: dyspnea Cardiac: negative Gastrointestinal: positive for abdominal pain, constipation and hemorrhoids and bloating, negative for, nausea, vomiting, diarrhea. Bleeding as noted in HPI. Genitourinary/Sexual: negative Ob/Gyn: negative for irregular bleeding, pain Musculoskeletal:right groin pain, back pain, neck pain Neurologic/Psych: negative   Re: bilateral feet swelling, seasonal allergies  Objective:  Physical Examination:  BP (!) 151/87 (BP Location: Left Arm, Patient Position: Sitting)   Pulse 90   Temp 98.2 F (36.8 C) (Tympanic)    Resp 18   Ht 5\' 2"  (1.575 m)   Wt 160 lb 11.2 oz (72.9 kg)   SpO2 98%   BMI 29.39 kg/m    Body mass index is 29.39 kg/m.    ECOG Performance Status: 0 - Asymptomatic  General appearance: alert, cooperative and appears stated age HEENT:PERRLA, extra ocular movement intact and sclera clear, anicteric Lymph node survey: non-palpable left inguinal nodes; on the right 1 cm palpable tender node adjacent to mons and approximately 2 cm away from midline- tender to palpation Abdomen: soft, 1-2 cm subcutaneous mass associated with appendectomy incision-tender, without intraabdominal masses or organomegaly, no hernias and well healed incision.  Back: inspection of back is normal. Non-tender Extremities: extremities normal, atraumatic, no cyanosis or edema  Neurological exam reveals alert, oriented, normal speech, no focal findings or movement disorder noted.  Pelvic: exam chaperoned by nurse;  Vulva: normal appearing vulva with no masses, tenderness or lesions; Vagina: normal vagina  (previously 1cm nodule right of midline- not appreciated today)  Adnexa: normal adnexa in size, nontender and no masses; Uterus: uterus very small size and nontender; Cervix: no lesions, very small and stenotic os Pap obtained   Lab Review Labs on site today: n/a  Radiologic Imaging: As noted above    Assessment:  Kristen Simon is a 67 y.o. female diagnosed with  squamous carcinoma noted in a R inguinal node, presumed cervical cancer s/p  Chemotherapy and radiation, clinically NED. Symptoms of right abdominal and groin pain, which are chronic and prior negative CT scan and negative PET scan. There are two areas of tenderness: one associated with incision in RLQ which may be scar tissue and the other a small right medial node (differential diagnosis includes recurrent disease but is unlikely given negative PET scan).   Rectal bleeding h/o hemorrhoids.    Lung-Rads category 3. Medical co-morbidities  complicating care: history of tobacco use, now stopped. Overweight.  Plan:   Plan for Pap today. We previously discussed the questionable utility of Pap given the uncertainty whether the cervix was even the primary site of disease and limited utility of Pap for cervical cancer surveillance. After 5 years we can probably extend Pap intervals to 3-5 years if we continue. Typically Pap smears are followed for 20 years post diagnosis of cervical cancer. With the changing algorithms for cervical cancer screening  these recommendations may change.   Dr. Maryland Pink and his team are willing to do her follow up care with pelvic exams.  Imaging and laboratory assessment is based on clinical indication.   I recommended screening colonoscopy given her age and rectal bleeding. While the episode may be due to hemorrhoids she has never had a screening colonoscopy and this will also assess for malignancy.   She will follow up with Dr. Kary Kos for her other medical needs.  And I recommended she follow up with him regarding her neck pain. Dr. Kary Kos will also be ordering her mammograms.   Suggested return to clinic as needed. She is now over 5 years out from therapy without evidence of recurrence and will be released from our clinic.   The patient's diagnosis, an outline of the further diagnostic and laboratory studies which will be required, the recommendation, and alternatives were discussed.  All questions were answered to the patient's satisfaction.   Verlon Au, NP  I personally reviewed the patient's history, completed key elements of her exam, and was involved in decision making in conjunction with Ms. Allen.   Gillis Ends, MD  CC:  Dr. Maryland Pink

## 2017-10-24 ENCOUNTER — Other Ambulatory Visit: Payer: Self-pay | Admitting: Obstetrics and Gynecology

## 2017-10-24 ENCOUNTER — Inpatient Hospital Stay: Payer: 59 | Attending: Obstetrics and Gynecology | Admitting: Obstetrics and Gynecology

## 2017-10-24 VITALS — BP 151/87 | HR 90 | Temp 98.2°F | Resp 18 | Ht 62.0 in | Wt 160.7 lb

## 2017-10-24 DIAGNOSIS — Z9221 Personal history of antineoplastic chemotherapy: Secondary | ICD-10-CM | POA: Diagnosis not present

## 2017-10-24 DIAGNOSIS — Z923 Personal history of irradiation: Secondary | ICD-10-CM | POA: Diagnosis not present

## 2017-10-24 DIAGNOSIS — Z8589 Personal history of malignant neoplasm of other organs and systems: Secondary | ICD-10-CM | POA: Diagnosis not present

## 2017-10-24 DIAGNOSIS — Z87891 Personal history of nicotine dependence: Secondary | ICD-10-CM | POA: Diagnosis not present

## 2017-10-24 DIAGNOSIS — Z859 Personal history of malignant neoplasm, unspecified: Secondary | ICD-10-CM | POA: Diagnosis not present

## 2017-10-24 DIAGNOSIS — C539 Malignant neoplasm of cervix uteri, unspecified: Secondary | ICD-10-CM

## 2017-10-24 NOTE — Progress Notes (Signed)
Chaperoned pelvic exam. Pap/hpv sent to lab. Oncology Nurse Navigator Documentation  Navigator Location: [facility] (10/24/17 1100)   )Navigator Encounter Type: Follow-up Appt (10/24/17 1100)                     Patient Visit Type: GynOnc (10/24/17 1100)                              Time Spent with Patient: 15 (10/24/17 1100)

## 2017-10-24 NOTE — Progress Notes (Signed)
Nodule  noted right groin area / patient states it has drainage with mild odor, Pain noted to area ( pain level 2) ( improving)

## 2017-10-29 LAB — PAP LB AND HPV HIGH-RISK
HPV, HIGH-RISK: NEGATIVE
PAP SMEAR COMMENT: 0

## 2017-11-01 ENCOUNTER — Telehealth: Payer: Self-pay

## 2017-11-01 NOTE — Telephone Encounter (Signed)
Called an notified Kristen Simon of pap smear results. She is aware she can be released from [facility] [place] clinic and will arrange her pap smears with Dr. Kary Kos. Pap smear results mailed to her at her request. We talked again about the need to continue her screening colonoscopy's. Offered to send referral to GI. She would like to make arrangements herself. Made her aware she can call at any time and we can send a referral for her.  Pap liquid-based and HPV (high risk)  Order: [id number]  Status:  Final result  Visible to patient:  No (Not Released)  Next appt:  12/31/2017 at 10:45 AM in Oncology ([facility])   Ref Range & Units 8d ago 43yr ago  DIAGNOSIS:  Comment  Comment CM  Comment: NEGATIVE FOR INTRAEPITHELIAL LESION OR MALIGNANCY.  Specimen adequacy:  Comment  Comment CM  Comment: Satisfactory for evaluation. Endocervical and/or squamous metaplastic  cells (endocervical component) are present.   Performed by:  Comment  Comment CM  Comment: Kaylyn Lim, Cytotechnologist (ASCP)  PAP Smear Comment  .  Marland Kitchen   Note:  Comment  Comment CM  Comment: The Pap smear is a screening test designed to aid in the detection of  premalignant and malignant conditions of the uterine cervix.  It is not a  diagnostic procedure and should not be used as the sole means of detecting  cervical cancer.  Both false-positive and false-negative reports do occur.   HPV, high-risk Negative Negative  Negative CM  Comment: This high-risk HPV test detects thirteen high-risk types  (16/18/31/33/35/39/45/51/52/56/58/59/68) without differentiation.   Resulting Agency  [facility]    Narrative  Performed by: [facility]  Performed at:  [address], [place], [place]  [id number] Lab Director: Rush Farmer MD, Phone:  [phone] Performed at:  [facility] [address], [place], [place]  [id number] Lab Director: Rush Farmer MD, Phone:  [phone] Specimen Comment: No. of  containers.Marland Kitchen01 ThinPrep Vial    Specimen Collected: 10/24/17 11:50 Last Resulted: 10/29/17 11:37 Lab Flowsheet Order Details View Encounter Lab and Collection Details Routing Result History - Result Edited    CM=Additional comments             Oncology Nurse Navigator Documentation  Navigator Location: [facility] (11/01/17 0900)   )Navigator Encounter Type: Telephone;Diagnostic Results (11/01/17 0900) Telephone: Outgoing Call;Diagnostic Results (11/01/17 0900)                                                  Time Spent with Patient: 15 (11/01/17 0900)

## 2017-12-31 ENCOUNTER — Encounter: Payer: Self-pay | Admitting: Internal Medicine

## 2017-12-31 ENCOUNTER — Inpatient Hospital Stay (HOSPITAL_BASED_OUTPATIENT_CLINIC_OR_DEPARTMENT_OTHER): Payer: 59 | Admitting: Internal Medicine

## 2017-12-31 ENCOUNTER — Other Ambulatory Visit: Payer: Self-pay

## 2017-12-31 ENCOUNTER — Inpatient Hospital Stay: Payer: 59 | Attending: Internal Medicine

## 2017-12-31 VITALS — BP 164/82 | HR 83 | Temp 97.9°F | Resp 20 | Ht 62.0 in | Wt 158.6 lb

## 2017-12-31 DIAGNOSIS — Z7982 Long term (current) use of aspirin: Secondary | ICD-10-CM

## 2017-12-31 DIAGNOSIS — C774 Secondary and unspecified malignant neoplasm of inguinal and lower limb lymph nodes: Secondary | ICD-10-CM | POA: Insufficient documentation

## 2017-12-31 DIAGNOSIS — Z8572 Personal history of non-Hodgkin lymphomas: Secondary | ICD-10-CM | POA: Insufficient documentation

## 2017-12-31 DIAGNOSIS — R1031 Right lower quadrant pain: Secondary | ICD-10-CM | POA: Insufficient documentation

## 2017-12-31 DIAGNOSIS — I10 Essential (primary) hypertension: Secondary | ICD-10-CM

## 2017-12-31 DIAGNOSIS — C801 Malignant (primary) neoplasm, unspecified: Secondary | ICD-10-CM | POA: Diagnosis not present

## 2017-12-31 DIAGNOSIS — Z87891 Personal history of nicotine dependence: Secondary | ICD-10-CM

## 2017-12-31 DIAGNOSIS — K921 Melena: Secondary | ICD-10-CM | POA: Diagnosis not present

## 2017-12-31 DIAGNOSIS — Z923 Personal history of irradiation: Secondary | ICD-10-CM

## 2017-12-31 DIAGNOSIS — E7800 Pure hypercholesterolemia, unspecified: Secondary | ICD-10-CM | POA: Diagnosis not present

## 2017-12-31 DIAGNOSIS — Z79899 Other long term (current) drug therapy: Secondary | ICD-10-CM | POA: Diagnosis not present

## 2017-12-31 DIAGNOSIS — Z9221 Personal history of antineoplastic chemotherapy: Secondary | ICD-10-CM | POA: Diagnosis not present

## 2017-12-31 LAB — CBC WITH DIFFERENTIAL/PLATELET
BASOS PCT: 1 %
Basophils Absolute: 0 10*3/uL (ref 0–0.1)
EOS ABS: 0.1 10*3/uL (ref 0–0.7)
EOS PCT: 3 %
HCT: 38.4 % (ref 35.0–47.0)
HEMOGLOBIN: 13.7 g/dL (ref 12.0–16.0)
LYMPHS ABS: 1.6 10*3/uL (ref 1.0–3.6)
Lymphocytes Relative: 34 %
MCH: 31.4 pg (ref 26.0–34.0)
MCHC: 35.6 g/dL (ref 32.0–36.0)
MCV: 88.4 fL (ref 80.0–100.0)
MONOS PCT: 10 %
Monocytes Absolute: 0.5 10*3/uL (ref 0.2–0.9)
NEUTROS PCT: 52 %
Neutro Abs: 2.4 10*3/uL (ref 1.4–6.5)
PLATELETS: 263 10*3/uL (ref 150–440)
RBC: 4.35 MIL/uL (ref 3.80–5.20)
RDW: 13 % (ref 11.5–14.5)
WBC: 4.7 10*3/uL (ref 3.6–11.0)

## 2017-12-31 LAB — COMPREHENSIVE METABOLIC PANEL
ALBUMIN: 4.3 g/dL (ref 3.5–5.0)
ALT: 26 U/L (ref 14–54)
AST: 27 U/L (ref 15–41)
Alkaline Phosphatase: 46 U/L (ref 38–126)
Anion gap: 8 (ref 5–15)
BUN: 11 mg/dL (ref 6–20)
CALCIUM: 9.5 mg/dL (ref 8.9–10.3)
CO2: 23 mmol/L (ref 22–32)
CREATININE: 0.6 mg/dL (ref 0.44–1.00)
Chloride: 106 mmol/L (ref 101–111)
Glucose, Bld: 113 mg/dL — ABNORMAL HIGH (ref 65–99)
Potassium: 4.5 mmol/L (ref 3.5–5.1)
Sodium: 137 mmol/L (ref 135–145)
Total Bilirubin: 0.7 mg/dL (ref 0.3–1.2)
Total Protein: 7.5 g/dL (ref 6.5–8.1)

## 2017-12-31 NOTE — Assessment & Plan Note (Addendum)
#  Right inguinal squamous cell carcinoma question subcu primary status post chemoradiation; May/June 2018-CT scan PET scan negative for any uptake or residual adenopathy.  #Chronic right inguinal discomfort/abdominal pain-less likely malignant.  #Intermittent blood in stools/question hemorrhoidal.  CBC today is normal limits.  Recommend colonoscopy/defer to PCP for referral.  # Follow-up with me in 12 months/labs.

## 2017-12-31 NOTE — Progress Notes (Signed)
[place] OFFICE PROGRESS NOTE  Patient Care Team: Maryland Pink, MD as PCP - General (Family Medicine) Clent Jacks, RN as Registered Nurse  Cancer Staging No matching staging information was found for the patient.   Oncology History   # squamous carcinoma noted in a R inguinal node, presumed cervical cancer s/p  Chemotherapy and radiation     Secondary malignant neoplasm of lymph nodes of inguinal region or lower extremity ([place])    Initial Diagnosis    Secondary malignant neoplasm of lymph nodes of inguinal region or lower extremity ([place])         INTERVAL HISTORY:  Kristen Simon 67 y.o.  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  In the interim patient was eval by gynecology oncology-no concerns of recurrence.  Patient denies any worsening abdominal pain nausea vomiting.  Continues to have chronic right inguinal region discomfort.  She continues to have intermittent blood in stools; thought to be hemorrhoidal.  She is awaiting to have a colonoscopy.    REVIEW OF SYSTEMS:  A complete 10 point review of system is done which is negative except mentioned above/history of present illness.   PAST MEDICAL HISTORY :  Past Medical History:  Diagnosis Date  . Allergy   . Breast screening, unspecified   . Elevated cholesterol   . History of cancer chemotherapy 2013  . Hx of radiation therapy 2013  . Hypertension 2010  . Internal hemorrhoids with other complication 0321  . Personal history of chemotherapy   . Personal history of radiation therapy   . Personal history of tobacco use, presenting hazards to health   . 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    lymphoma 2013  . Ulcer 1970   stomach-resolved  . Unilateral or unspecified femoral hernia with obstruction     PAST SURGICAL  HISTORY :   Past Surgical History:  Procedure Laterality Date  . ANOSCOPY  03/27/2012   anal biopsy/hemorrhoid-benign  . APPENDECTOMY  1970  . FRACTURE SURGERY  2248,2500   right wrist,right leg  . GROIN MASS OPEN BIOPSY Right 01/24/2012   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  . HERNIA REPAIR Right 01/10/2012   right femerol hernia  . PATELLA FRACTURE SURGERY Right 1982    FAMILY HISTORY :   Family History  Problem Relation Age of Onset  . Cancer Mother        lung  . Breast cancer Maternal Aunt     SOCIAL HISTORY:   Social History   Tobacco Use  . Smoking status: Former Smoker    Packs/day: 0.88    Years: 40.00    Pack years: 35.20    Last attempt to quit: 08/01/2015    Years since quitting: 2.4  . Smokeless tobacco: Never Used  Substance Use Topics  . Alcohol use: No  . Drug use: No    ALLERGIES:  has No Known Allergies.  MEDICATIONS:  Current Outpatient Medications  Medication Sig Dispense Refill  . Acetaminophen (TYLENOL ARTHRITIS PAIN PO) Take 650 mg 3 (three) times daily by mouth.    Marland Kitchen albuterol (PROVENTIL HFA;VENTOLIN HFA) 108 (90 BASE) MCG/ACT inhaler Inhale into the lungs every 6 (six) hours as needed for wheezing or shortness of breath.    Marland Kitchen amLODipine (NORVASC) 2.5 MG tablet Take 7.5 mg by mouth daily.     Marland Kitchen  aspirin 81 MG tablet Take 81 mg by mouth daily.    . Calcium Carbonate-Vit D-Min (CALTRATE 600+D PLUS MINERALS) 600-800 MG-UNIT CHEW Chew 1 tablet 3 (three) times a week by mouth.    . cyclobenzaprine (FLEXERIL) 5 MG tablet Take 5 mg by mouth daily as needed for muscle pain.    . fluticasone (FLONASE) 50 MCG/ACT nasal spray Place 2 sprays into the nose daily.    . Lactobacillus (CVS PROBIOTIC ACIDOPHILUS) 10 MG CAPS Take 1 tablet by mouth daily.    Marland Kitchen loratadine (CLARITIN REDITABS) 10 MG dissolvable tablet Take 10 mg by mouth daily.    . meloxicam (MOBIC) 15 MG tablet Take 15 mg by mouth daily.   1  .  senna-docusate (SENOKOT-S) 8.6-50 MG tablet Take 1 tablet by mouth at bedtime as needed. 30 tablet 6   No current facility-administered medications for this visit.     PHYSICAL EXAMINATION: ECOG PERFORMANCE STATUS: 1 - Symptomatic but completely ambulatory  BP (!) 164/82 (BP Location: Left Arm, Patient Position: Sitting)   Pulse 83   Temp 97.9 F (36.6 C) (Tympanic)   Resp 20   Ht 5\' 2"  (1.575 m)   Wt 158 lb 9.6 oz (71.9 kg)   BMI 29.01 kg/m   Filed Weights   12/31/17 1102  Weight: 158 lb 9.6 oz (71.9 kg)   GENERAL: Well-nourished well-developed; Alert, no distress and comfortable.   Accompanied by her husband. Obese.  EYES: no pallor or icterus OROPHARYNX: no thrush or ulceration; NECK: supple, no masses felt LYMPH:  no palpable lymphadenopathy in the cervical, axillaryregion.  Right inguinal region approximately 1-2 cm lump noted [lymph node versus scar tissue- chronic]  LUNGS: clear to auscultation and  No wheeze or crackles HEART/CVS: regular rate & rhythm and no murmurs; No lower extremity edema ABDOMEN:abdomen soft, ? Tenderness  right lower quadrant inguinal region [? chronic] and normal bowel sounds Musculoskeletal:no cyanosis of digits and no clubbing  PSYCH: alert & oriented x 3 with fluent speech NEURO: no focal motor/sensory deficits SKIN:  no rashes or significant lesions  LABORATORY DATA:  I have reviewed the data as listed    Component Value Date/Time   NA 137 12/31/2017 1048   NA 140 03/19/2017 1131   NA 137 12/09/2014 1114   K 4.5 12/31/2017 1048   K 3.7 12/09/2014 1114   CL 106 12/31/2017 1048   CL 103 12/09/2014 1114   CO2 23 12/31/2017 1048   CO2 27 12/09/2014 1114   GLUCOSE 113 (H) 12/31/2017 1048   GLUCOSE 130 (H) 12/09/2014 1114   BUN 11 12/31/2017 1048   BUN 9 03/19/2017 1131   BUN 10 12/09/2014 1114   CREATININE 0.60 12/31/2017 1048   CREATININE 0.62 12/09/2014 1114   CALCIUM 9.5 12/31/2017 1048   CALCIUM 9.3 12/09/2014 1114   PROT 7.5  12/31/2017 1048   PROT 7.3 03/19/2017 1131   PROT 7.2 12/09/2014 1114   ALBUMIN 4.3 12/31/2017 1048   ALBUMIN 4.9 (H) 03/19/2017 1131   ALBUMIN 4.4 12/09/2014 1114   AST 27 12/31/2017 1048   AST 31 12/09/2014 1114   ALT 26 12/31/2017 1048   ALT 35 12/09/2014 1114   ALKPHOS 46 12/31/2017 1048   ALKPHOS 47 12/09/2014 1114   BILITOT 0.7 12/31/2017 1048   BILITOT 0.3 03/19/2017 1131   BILITOT 0.6 12/09/2014 1114   GFRNONAA >60 12/31/2017 1048   GFRNONAA >60 12/09/2014 1114   GFRAA >60 12/31/2017 1048   GFRAA >60 12/09/2014 1114  No results found for: SPEP, UPEP  Lab Results  Component Value Date   WBC 4.7 12/31/2017   NEUTROABS 2.4 12/31/2017   HGB 13.7 12/31/2017   HCT 38.4 12/31/2017   MCV 88.4 12/31/2017   PLT 263 12/31/2017      Chemistry      Component Value Date/Time   NA 137 12/31/2017 1048   NA 140 03/19/2017 1131   NA 137 12/09/2014 1114   K 4.5 12/31/2017 1048   K 3.7 12/09/2014 1114   CL 106 12/31/2017 1048   CL 103 12/09/2014 1114   CO2 23 12/31/2017 1048   CO2 27 12/09/2014 1114   BUN 11 12/31/2017 1048   BUN 9 03/19/2017 1131   BUN 10 12/09/2014 1114   CREATININE 0.60 12/31/2017 1048   CREATININE 0.62 12/09/2014 1114      Component Value Date/Time   CALCIUM 9.5 12/31/2017 1048   CALCIUM 9.3 12/09/2014 1114   ALKPHOS 46 12/31/2017 1048   ALKPHOS 47 12/09/2014 1114   AST 27 12/31/2017 1048   AST 31 12/09/2014 1114   ALT 26 12/31/2017 1048   ALT 35 12/09/2014 1114   BILITOT 0.7 12/31/2017 1048   BILITOT 0.3 03/19/2017 1131   BILITOT 0.6 12/09/2014 1114       RADIOGRAPHIC STUDIES: I have personally reviewed the radiological images as listed and agreed with the findings in the report. No results found.   ASSESSMENT & PLAN:  Secondary malignant neoplasm of lymph nodes of inguinal region or lower extremity ([place]) #Right inguinal squamous cell carcinoma question subcu primary status post chemoradiation; May/June 2018-CT scan PET scan  negative for any uptake or residual adenopathy.  #Chronic right inguinal discomfort/abdominal pain-less likely malignant.  #Intermittent blood in stools/question hemorrhoidal.  CBC today is normal limits.  Recommend colonoscopy/defer to PCP for referral.  # Follow-up with me in 12 months/labs.    Orders Placed This Encounter  Procedures  . Comprehensive metabolic panel    Standing Status:   Future    Standing Expiration Date:   07/04/2019  . CBC with Differential    Standing Status:   Future    Standing Expiration Date:   07/04/2019   All questions were answered. The patient knows to call the clinic with any problems, questions or concerns.      Cammie Sickle, MD 12/31/2017 12:35 PM

## 2018-01-09 DIAGNOSIS — R0602 Shortness of breath: Secondary | ICD-10-CM | POA: Diagnosis not present

## 2018-01-09 DIAGNOSIS — J019 Acute sinusitis, unspecified: Secondary | ICD-10-CM | POA: Diagnosis not present

## 2018-01-09 DIAGNOSIS — R5381 Other malaise: Secondary | ICD-10-CM | POA: Diagnosis not present

## 2018-01-09 DIAGNOSIS — R6889 Other general symptoms and signs: Secondary | ICD-10-CM | POA: Diagnosis not present

## 2018-01-09 DIAGNOSIS — E559 Vitamin D deficiency, unspecified: Secondary | ICD-10-CM | POA: Diagnosis not present

## 2018-01-09 DIAGNOSIS — R5383 Other fatigue: Secondary | ICD-10-CM | POA: Diagnosis not present

## 2018-01-14 ENCOUNTER — Telehealth: Payer: Self-pay | Admitting: *Deleted

## 2018-01-14 NOTE — Telephone Encounter (Signed)
Contacted to schedule lung screening scan. Patient requests to call me back next week when she knows her schedule.

## 2018-01-18 DIAGNOSIS — E538 Deficiency of other specified B group vitamins: Secondary | ICD-10-CM | POA: Diagnosis not present

## 2018-01-25 DIAGNOSIS — E538 Deficiency of other specified B group vitamins: Secondary | ICD-10-CM | POA: Diagnosis not present

## 2018-02-01 ENCOUNTER — Telehealth: Payer: Self-pay | Admitting: *Deleted

## 2018-02-01 DIAGNOSIS — E538 Deficiency of other specified B group vitamins: Secondary | ICD-10-CM | POA: Diagnosis not present

## 2018-02-01 NOTE — Telephone Encounter (Signed)
Contacted in attempt to schedule lung screening scan that is due. Patient requests to call back due to busy schedule. Confirmed that patient will call me back.

## 2018-02-08 DIAGNOSIS — M5441 Lumbago with sciatica, right side: Secondary | ICD-10-CM | POA: Diagnosis not present

## 2018-02-08 DIAGNOSIS — E538 Deficiency of other specified B group vitamins: Secondary | ICD-10-CM | POA: Diagnosis not present

## 2018-02-08 DIAGNOSIS — J309 Allergic rhinitis, unspecified: Secondary | ICD-10-CM | POA: Diagnosis not present

## 2018-02-08 DIAGNOSIS — H8113 Benign paroxysmal vertigo, bilateral: Secondary | ICD-10-CM | POA: Diagnosis not present

## 2018-02-26 ENCOUNTER — Telehealth: Payer: Self-pay | Admitting: *Deleted

## 2018-02-26 DIAGNOSIS — Z122 Encounter for screening for malignant neoplasm of respiratory organs: Secondary | ICD-10-CM

## 2018-02-26 DIAGNOSIS — Z87891 Personal history of nicotine dependence: Secondary | ICD-10-CM

## 2018-02-26 NOTE — Telephone Encounter (Signed)
Notified patient that annual lung cancer screening low dose CT scan is due currently or will be in near future. Confirmed that patient is within the age range of 55-77, and asymptomatic, (no signs or symptoms of lung cancer). Patient denies illness that would prevent curative treatment for lung cancer if found. Verified smoking history, (former, quit 2016, 35 pack year). The shared decision making visit was done 01/14/16. Patient is agreeable for CT scan being scheduled.

## 2018-03-07 ENCOUNTER — Ambulatory Visit
Admission: RE | Admit: 2018-03-07 | Discharge: 2018-03-07 | Disposition: A | Discharge status: Home / Self Care | Payer: 59 | Source: Ambulatory Visit | Attending: Nurse Practitioner | Admitting: Nurse Practitioner

## 2018-03-07 DIAGNOSIS — J439 Emphysema, unspecified: Secondary | ICD-10-CM | POA: Insufficient documentation

## 2018-03-07 DIAGNOSIS — Z122 Encounter for screening for malignant neoplasm of respiratory organs: Secondary | ICD-10-CM | POA: Diagnosis not present

## 2018-03-07 DIAGNOSIS — I700 Atherosclerosis of aorta: Secondary | ICD-10-CM | POA: Insufficient documentation

## 2018-03-07 DIAGNOSIS — Z87891 Personal history of nicotine dependence: Secondary | ICD-10-CM

## 2018-03-11 ENCOUNTER — Encounter: Payer: Self-pay | Admitting: *Deleted

## 2018-03-12 DIAGNOSIS — K625 Hemorrhage of anus and rectum: Secondary | ICD-10-CM | POA: Diagnosis not present

## 2018-04-12 DIAGNOSIS — E559 Vitamin D deficiency, unspecified: Secondary | ICD-10-CM | POA: Diagnosis not present

## 2018-04-12 DIAGNOSIS — E538 Deficiency of other specified B group vitamins: Secondary | ICD-10-CM | POA: Diagnosis not present

## 2018-05-01 DIAGNOSIS — I10 Essential (primary) hypertension: Secondary | ICD-10-CM | POA: Diagnosis not present

## 2018-05-21 DIAGNOSIS — M5442 Lumbago with sciatica, left side: Secondary | ICD-10-CM | POA: Diagnosis not present

## 2018-05-21 DIAGNOSIS — M5136 Other intervertebral disc degeneration, lumbar region: Secondary | ICD-10-CM | POA: Diagnosis not present

## 2018-05-21 DIAGNOSIS — M7062 Trochanteric bursitis, left hip: Secondary | ICD-10-CM | POA: Diagnosis not present

## 2018-06-11 DIAGNOSIS — R0602 Shortness of breath: Secondary | ICD-10-CM | POA: Diagnosis not present

## 2018-06-11 DIAGNOSIS — K219 Gastro-esophageal reflux disease without esophagitis: Secondary | ICD-10-CM | POA: Diagnosis not present

## 2018-06-11 DIAGNOSIS — K625 Hemorrhage of anus and rectum: Secondary | ICD-10-CM | POA: Diagnosis not present

## 2018-06-14 DIAGNOSIS — J449 Chronic obstructive pulmonary disease, unspecified: Secondary | ICD-10-CM | POA: Diagnosis not present

## 2018-06-14 DIAGNOSIS — J432 Centrilobular emphysema: Secondary | ICD-10-CM | POA: Diagnosis not present

## 2018-06-14 DIAGNOSIS — R0602 Shortness of breath: Secondary | ICD-10-CM | POA: Diagnosis not present

## 2018-07-03 ENCOUNTER — Telehealth: Payer: Self-pay

## 2018-07-03 ENCOUNTER — Inpatient Hospital Stay: Payer: 59 | Attending: Obstetrics and Gynecology | Admitting: Obstetrics and Gynecology

## 2018-07-03 VITALS — BP 146/83 | HR 87 | Temp 97.3°F | Resp 18 | Ht 62.0 in | Wt 165.0 lb

## 2018-07-03 DIAGNOSIS — C801 Malignant (primary) neoplasm, unspecified: Secondary | ICD-10-CM | POA: Diagnosis not present

## 2018-07-03 DIAGNOSIS — Z87891 Personal history of nicotine dependence: Secondary | ICD-10-CM | POA: Diagnosis not present

## 2018-07-03 DIAGNOSIS — R102 Pelvic and perineal pain: Secondary | ICD-10-CM

## 2018-07-03 DIAGNOSIS — Z9221 Personal history of antineoplastic chemotherapy: Secondary | ICD-10-CM | POA: Diagnosis not present

## 2018-07-03 DIAGNOSIS — Z923 Personal history of irradiation: Secondary | ICD-10-CM

## 2018-07-03 DIAGNOSIS — C774 Secondary and unspecified malignant neoplasm of inguinal and lower limb lymph nodes: Secondary | ICD-10-CM

## 2018-07-03 DIAGNOSIS — Z8541 Personal history of malignant neoplasm of cervix uteri: Secondary | ICD-10-CM

## 2018-07-03 NOTE — Progress Notes (Signed)
Gynecologic Oncology Interval Visit   Referring Provider:  Referred by Dr. Wende Bushy. Rogue Bussing   Chief Concern: History of squamous carcinoma noted in a R inguinal node, presumed cervical cancer.   Subjective:  Kristen Simon is a 67 y.o. female who returns to clinic today after being released due to complaints of pelvic pain.   She was previously seen for continued surveillance for squamous carcinoma noted in a R inguinal node, presumed cervical cancer.    She is scheduled for colonoscopy and they requested clearance.   Pap 10/24/2017 NILM/HRHPV negative   Gynecologic Oncology  History Kristen Simon is a very pleasant patient with a history of squamous carcinoma noted in a R inguinal node, presumed metastatic cervical cancer.   12/2011        squamous carcinoma noted in a R inguinal node,CT /  Pet scan without definite primary,              anal exam WNL, pelvic exam normal, but incomplete due to discomfort, ECC neg,   tissue evaluation c/w/ cervical primary,                   chemo-XRT, completed NED 05/2012  Dr. Sabra Heck in August 2015 negative exam and negative Pap smear.   Dr. Oliva Bustard March 2016 for a follow-up CT scan that was negative for evidence of recurrent disease.  10/21/2014 CT scan Abdomen and pelvis IMPRESSION: 1. No evidence of recurrent or metastatic disease. 2. Question mild hepatic steatosis. 3. Suspect a 2.5 cm uterine fibroid.  October 2016:Dr. Choksi for a follow-up and had a negative exam including breast exam. Her mammogram was negative.   12/08/2015 Pap NILM; HPV negative  12/17/2015 CT scan IMPRESSION: No evidence of metastatic disease.  Uterine fibroid. Hepatic steatosis.  01/14/2016: Kristen Nim NP  smoking cessation and lung cancer screening reviewed. She has quit smoking.   01/14/2016 CT Chest Lung Cancer Screening IMPRESSION: 1. Lung-Rads category 3, probably benign findings. Short-term follow-up in 6 months is recommended with  repeat low-dose chest CT without contrast (please use the following order, "CT CHEST LCS NODULE FOLLOW-UP W/O CM"). 2. Mild diffuse bronchial wall thickening with mild centrilobular and paraseptal emphysema; imaging findings suggestive of underlying COPD. 3. Hepatic steatosis.  01/09/2017 PET scan IMPRESSION: 1. No findings of active malignancy in the neck, chest, abdomen, or pelvis. No definite hypermetabolic cervical mass is readily apparent. 2. Emphysema. 3. Old granulomatous disease. 4. Diffuse hepatic steatosis.  She has been NED and was released from [facility] clinic February 2019.    Problem List: Patient Active Problem List   Diagnosis Date Noted  . Long term current use of opiate analgesic 03/19/2017  . Long term prescription opiate use 03/19/2017  . Opiate use 03/19/2017  . Chronic pain syndrome 03/19/2017  . Chronic low back pain, unspecified back pain laterality, with sciatica presence unspecified (primary) (bilateral) (R>L) 03/19/2017  . Chronic neck pain 03/19/2017  . Pain in both lower extremities (tertiary)(R>L) 03/19/2017  . Bilateral groin pain (secondary) (R>L) 03/19/2017  . Sacroiliac joint pain 03/19/2017  . Primary cervical cancer ([place]) 12/29/2016  . Personal history of tobacco use, presenting hazards to health 01/13/2016  . Benign essential HTN 01/29/2015  . Mixed hyperlipidemia 01/29/2015  . Shortness of breath 01/29/2015  . Cough 01/02/2014  . Secondary malignant neoplasm of lymph nodes of inguinal region or lower extremity ([place])   . History of cancer chemotherapy     Past Medical History: Past Medical History:  Diagnosis Date  . Allergy   . Breast screening, unspecified   . Elevated cholesterol   . History of cancer chemotherapy 2013  . Hx of radiation therapy 2013  . Hypertension 2010  . Internal hemorrhoids with other complication 6834  . Personal history of chemotherapy   . Personal history of radiation therapy   . Personal history of tobacco  use, presenting hazards to health   . 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    lymphoma 2013  . Ulcer 1970   stomach-resolved  . Unilateral or unspecified femoral hernia with obstruction     Past Surgical History: Past Surgical History:  Procedure Laterality Date  . ANOSCOPY  03/27/2012   anal biopsy/hemorrhoid-benign  . APPENDECTOMY  1970  . FRACTURE SURGERY  1962,2297   right wrist,right leg  . GROIN MASS OPEN BIOPSY Right 01/24/2012   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  . HERNIA REPAIR Right 01/10/2012   right femerol hernia  . PATELLA FRACTURE SURGERY Right 1982    Past Gynecologic History:   Gravida 1   Para 1   Age at Menarche 67   Age at Menopause 82   Regular Pap Smears Yes   Additional Hx patient does not recall any gyn problems in the past    OB History:  OB History  Obstetric Comments  Age with first menstruation-9  LMP-age 63    Family History: Family History  Problem Relation Age of Onset  . Cancer Mother        lung  . Breast cancer Maternal Aunt     Social History: Social History   Socioeconomic History  . Marital status: Married    Spouse name: Not on file  . Number of children: Not on file  . Years of education: Not on file  . Highest education level: Not on file  Occupational History  . Not on file  Social Needs  . Financial resource strain: Not on file  . Food insecurity:    Worry: Not on file    Inability: Not on file  . Transportation needs:    Medical: Not on file    Non-medical: Not on file  Tobacco Use  . Smoking status: Former Smoker    Packs/day: 0.88    Years: 40.00    Pack years: 35.20    Last attempt to quit: 08/01/2015    Years since quitting: 2.9  . Smokeless tobacco: Never Used  Substance and Sexual Activity  . Alcohol use: No  . Drug use: No  . Sexual activity: Never  Lifestyle  . Physical activity:     Days per week: Not on file    Minutes per session: Not on file  . Stress: Not on file  Relationships  . Social connections:    Talks on phone: Not on file    Gets together: Not on file    Attends religious service: Not on file    Active member of club or organization: Not on file    Attends meetings of clubs or organizations: Not on file    Relationship status: Not on file  . Intimate partner violence:    Fear of current or ex partner: Not on file    Emotionally abused: Not on file    Physically abused: Not on file    Forced sexual activity: Not on file  Other Topics Concern  . Not on file  Social History Narrative  .  Not on file    Allergies: No Known Allergies  Current Medications: Current Outpatient Medications  Medication Sig Dispense Refill  . amLODipine (NORVASC) 2.5 MG tablet Take 7.5 mg by mouth daily.     Marland Kitchen aspirin 81 MG tablet Take 81 mg by mouth daily.    . Calcium Carbonate-Vit D-Min (CALTRATE 600+D PLUS MINERALS) 600-800 MG-UNIT CHEW Chew 1 tablet 3 (three) times a week by mouth.    . fluticasone (FLONASE) 50 MCG/ACT nasal spray Place 2 sprays into the nose daily.    . Lactobacillus (CVS PROBIOTIC ACIDOPHILUS) 10 MG CAPS Take 1 tablet by mouth daily.    Marland Kitchen loratadine (CLARITIN REDITABS) 10 MG dissolvable tablet Take 10 mg by mouth daily.    . meloxicam (MOBIC) 15 MG tablet Take 15 mg by mouth daily.   1  . metoprolol succinate (TOPROL-XL) 25 MG 24 hr tablet Start 1/2 qd ,may increase to 1 po qd after 2 weeks if needed    . senna-docusate (SENOKOT-S) 8.6-50 MG tablet Take 1 tablet by mouth at bedtime as needed. 30 tablet 6  . Acetaminophen (TYLENOL ARTHRITIS PAIN PO) Take 650 mg 3 (three) times daily by mouth.    Marland Kitchen albuterol (PROVENTIL HFA;VENTOLIN HFA) 108 (90 BASE) MCG/ACT inhaler Inhale into the lungs every 6 (six) hours as needed for wheezing or shortness of breath.    . cyclobenzaprine (FLEXERIL) 5 MG tablet Take 5 mg by mouth daily as needed for muscle pain.      No current facility-administered medications for this visit.     Review of Systems General: fatigue, weakness Pulmonary: dyspnea Cardiac: negative Gastrointestinal: positive for abdominal pain and constipation, negative for, nausea, vomiting, diarrhea. Her pain symptoms are the same as before Genitourinary/Sexual: no bladder function issues Ob/Gyn: pelvic pain the same as before Musculoskeletal: H/O right groin pain, back pain, neck pain Neurologic/Psych: negative   Re: bilateral feet swelling, seasonal allergies  Objective:  Physical Examination:  BP (!) 146/83 (Patient Position: Sitting)   Pulse 87   Temp (!) 97.3 F (36.3 C) (Tympanic)   Resp 18   Ht 5\' 2"  (1.575 m)   Wt 165 lb (74.8 kg)   SpO2 96%   BMI 30.18 kg/m    Body mass index is 30.18 kg/m.    ECOG Performance Status: 0 - Asymptomatic  General appearance: alert, cooperative and appears stated age HEENT:PERRLA, extra ocular movement intact and sclera clear, anicteric Lymph node survey: non-palpable left inguinal nodes; on the right 1 cm palpable tender node adjacent to mons and approximately 2 cm away from midline- tender to palpation Abdomen: soft, 1-2 cm subcutaneous mass associated with appendectomy incision-tender, without intraabdominal masses or organomegaly, no hernias and well healed incision.  Back: inspection of back is normal. Non-tender Extremities: extremities normal, atraumatic, no cyanosis or edema  Neurological exam reveals alert, oriented, normal speech, no focal findings or movement disorder noted.  Pelvic: exam chaperoned by nurse;  Vulva: normal appearing vulva with no masses, tenderness or lesions; Vagina: normal vagina  Adnexa: normal adnexa in size, nontender and no masses; Uterus: uterus very small size and nontender; Cervix: no lesions, very small and stenotic os    Lab Review Labs on site today: n/a  Radiologic Imaging: As noted above    Assessment:  GELENA KLOSINSKI is  a 67 y.o. female diagnosed with  squamous carcinoma noted in a R inguinal node, presumed cervical cancer s/p  Chemotherapy and radiation, clinically NED. Symptoms of right abdominal and groin pain,  which are chronic and prior negative CT scan and negative PET scan. There are two areas of tenderness: one associated with incision in RLQ which may be scar tissue and the other a small right medial node (differential diagnosis includes recurrent disease but is unlikely given negative PET scan).   Abdominal and pelvic pain unchanged.     Lung-Rads category 3. Medical co-morbidities complicating care: history of tobacco use, now stopped. Overweight.  Plan:   She is cleared for colonoscopy. Her symptoms are chronic and have not worsened. She will be released from [facility] clinic. Dr. Maryland Pink and his team are willing to do her follow up care with pelvic exams. She can return as needed.   Imaging and laboratory assessment is based on clinical indication.   I had recommended screening colonoscopy given her age and rectal bleeding before and we cleared her for this procedure today.   She will follow up with Dr. Kary Kos for her other medical needs.  And I recommended she follow up with him regarding her neck pain. Dr. Kary Kos will also be ordering her mammograms.   The patient's diagnosis, an outline of the further diagnostic and laboratory studies which will be required, the recommendation, and alternatives were discussed.  All questions were answered to the patient's satisfaction.  A total of 15 minutes were spent with the patient/family today; >50% was spent in education, counseling and coordination of care for history of cervical cancer.  Kristen Ends, MD (I personally performed the visit)   CC:  Dr. Maryland Pink

## 2018-07-03 NOTE — Telephone Encounter (Signed)
Kristen Simon has a Gyn Onc appointment today at 1500. She made this appointment stating she needed to be seen prior to her colonoscopy per another physician. She was referred to colonoscopy by Gyn Onc. She was released from [place] in 09/2017. I have spoke with Kristen Simon and contacted [facility]. They report "she doesn't know why she would need to see you guys prior to her Colonoscopy other than her reporting some pelvic pain, but we only need clearances from Pulmonary and Cardiology for her Colon to be performed." Voicemail left with Kristen Simon to call prior to coming to appointment today. We will assess if she is having any new symptoms. GI does not need her to be seen prior to colonoscopy. Oncology Nurse Navigator Documentation  Navigator Location: [facility] (07/03/18 1200)   )Navigator Encounter Type: Telephone (07/03/18 1200) Telephone: [place] Call (07/03/18 1200)                                                  Time Spent with Patient: 15 (07/03/18 1200)

## 2018-07-03 NOTE — Progress Notes (Signed)
Right lower abdomen pain ( 2) and pelvic pain (2). The patient c/o SOB

## 2018-07-18 DIAGNOSIS — K760 Fatty (change of) liver, not elsewhere classified: Secondary | ICD-10-CM | POA: Diagnosis not present

## 2018-07-18 DIAGNOSIS — K219 Gastro-esophageal reflux disease without esophagitis: Secondary | ICD-10-CM | POA: Diagnosis not present

## 2018-07-18 DIAGNOSIS — Z8719 Personal history of other diseases of the digestive system: Secondary | ICD-10-CM | POA: Diagnosis not present

## 2018-07-23 DIAGNOSIS — I10 Essential (primary) hypertension: Secondary | ICD-10-CM | POA: Diagnosis not present

## 2018-07-23 DIAGNOSIS — Z01818 Encounter for other preprocedural examination: Secondary | ICD-10-CM | POA: Diagnosis not present

## 2018-07-23 DIAGNOSIS — I25118 Atherosclerotic heart disease of native coronary artery with other forms of angina pectoris: Secondary | ICD-10-CM | POA: Diagnosis not present

## 2018-08-07 DIAGNOSIS — I25118 Atherosclerotic heart disease of native coronary artery with other forms of angina pectoris: Secondary | ICD-10-CM | POA: Diagnosis not present

## 2018-08-13 DIAGNOSIS — I70219 Atherosclerosis of native arteries of extremities with intermittent claudication, unspecified extremity: Secondary | ICD-10-CM | POA: Diagnosis not present

## 2018-08-13 DIAGNOSIS — I6523 Occlusion and stenosis of bilateral carotid arteries: Secondary | ICD-10-CM | POA: Diagnosis not present

## 2018-08-15 DIAGNOSIS — R0602 Shortness of breath: Secondary | ICD-10-CM | POA: Diagnosis not present

## 2018-08-15 DIAGNOSIS — I10 Essential (primary) hypertension: Secondary | ICD-10-CM | POA: Diagnosis not present

## 2018-08-15 DIAGNOSIS — I2510 Atherosclerotic heart disease of native coronary artery without angina pectoris: Secondary | ICD-10-CM | POA: Diagnosis not present

## 2018-08-26 ENCOUNTER — Ambulatory Visit: Admission: RE | Admit: 2018-08-26 | Payer: 59 | Source: Home / Self Care | Admitting: Unknown Physician Specialty

## 2018-08-26 ENCOUNTER — Encounter: Admission: RE | Payer: Self-pay | Source: Home / Self Care

## 2018-08-26 SURGERY — COLONOSCOPY WITH PROPOFOL
Anesthesia: General

## 2018-09-02 DIAGNOSIS — R42 Dizziness and giddiness: Secondary | ICD-10-CM | POA: Diagnosis not present

## 2018-09-02 DIAGNOSIS — J0100 Acute maxillary sinusitis, unspecified: Secondary | ICD-10-CM | POA: Diagnosis not present

## 2018-09-12 DIAGNOSIS — J449 Chronic obstructive pulmonary disease, unspecified: Secondary | ICD-10-CM | POA: Diagnosis not present

## 2018-09-12 DIAGNOSIS — J322 Chronic ethmoidal sinusitis: Secondary | ICD-10-CM | POA: Diagnosis not present

## 2018-09-19 DIAGNOSIS — Z1283 Encounter for screening for malignant neoplasm of skin: Secondary | ICD-10-CM | POA: Diagnosis not present

## 2018-09-19 DIAGNOSIS — L821 Other seborrheic keratosis: Secondary | ICD-10-CM | POA: Diagnosis not present

## 2018-09-19 DIAGNOSIS — Z85828 Personal history of other malignant neoplasm of skin: Secondary | ICD-10-CM | POA: Diagnosis not present

## 2018-09-27 ENCOUNTER — Encounter: Payer: Self-pay | Admitting: *Deleted

## 2018-09-30 ENCOUNTER — Encounter: Payer: Self-pay | Admitting: Anesthesiology

## 2018-09-30 ENCOUNTER — Encounter
Admission: RE | Disposition: A | Discharge status: Home / Self Care | Payer: Self-pay | Source: Home / Self Care | Attending: Unknown Physician Specialty

## 2018-09-30 ENCOUNTER — Ambulatory Visit: Payer: 59 | Admitting: Anesthesiology

## 2018-09-30 ENCOUNTER — Ambulatory Visit
Admission: RE | Admit: 2018-09-30 | Discharge: 2018-09-30 | Disposition: A | Discharge status: Home / Self Care | Payer: 59 | Attending: Unknown Physician Specialty | Admitting: Unknown Physician Specialty

## 2018-09-30 DIAGNOSIS — Z7982 Long term (current) use of aspirin: Secondary | ICD-10-CM | POA: Insufficient documentation

## 2018-09-30 DIAGNOSIS — Z7951 Long term (current) use of inhaled steroids: Secondary | ICD-10-CM | POA: Insufficient documentation

## 2018-09-30 DIAGNOSIS — E7800 Pure hypercholesterolemia, unspecified: Secondary | ICD-10-CM | POA: Diagnosis not present

## 2018-09-30 DIAGNOSIS — D649 Anemia, unspecified: Secondary | ICD-10-CM | POA: Diagnosis not present

## 2018-09-30 DIAGNOSIS — Z801 Family history of malignant neoplasm of trachea, bronchus and lung: Secondary | ICD-10-CM | POA: Insufficient documentation

## 2018-09-30 DIAGNOSIS — D126 Benign neoplasm of colon, unspecified: Secondary | ICD-10-CM | POA: Diagnosis not present

## 2018-09-30 DIAGNOSIS — J449 Chronic obstructive pulmonary disease, unspecified: Secondary | ICD-10-CM | POA: Diagnosis not present

## 2018-09-30 DIAGNOSIS — K635 Polyp of colon: Secondary | ICD-10-CM | POA: Diagnosis not present

## 2018-09-30 DIAGNOSIS — Z79899 Other long term (current) drug therapy: Secondary | ICD-10-CM | POA: Diagnosis not present

## 2018-09-30 DIAGNOSIS — Z87891 Personal history of nicotine dependence: Secondary | ICD-10-CM | POA: Insufficient documentation

## 2018-09-30 DIAGNOSIS — R1310 Dysphagia, unspecified: Secondary | ICD-10-CM | POA: Diagnosis not present

## 2018-09-30 DIAGNOSIS — K319 Disease of stomach and duodenum, unspecified: Secondary | ICD-10-CM | POA: Insufficient documentation

## 2018-09-30 DIAGNOSIS — D125 Benign neoplasm of sigmoid colon: Secondary | ICD-10-CM | POA: Diagnosis not present

## 2018-09-30 DIAGNOSIS — Z9221 Personal history of antineoplastic chemotherapy: Secondary | ICD-10-CM | POA: Insufficient documentation

## 2018-09-30 DIAGNOSIS — D128 Benign neoplasm of rectum: Secondary | ICD-10-CM | POA: Diagnosis not present

## 2018-09-30 DIAGNOSIS — K621 Rectal polyp: Secondary | ICD-10-CM | POA: Insufficient documentation

## 2018-09-30 DIAGNOSIS — K210 Gastro-esophageal reflux disease with esophagitis: Secondary | ICD-10-CM | POA: Insufficient documentation

## 2018-09-30 DIAGNOSIS — K760 Fatty (change of) liver, not elsewhere classified: Secondary | ICD-10-CM | POA: Diagnosis not present

## 2018-09-30 DIAGNOSIS — K625 Hemorrhage of anus and rectum: Secondary | ICD-10-CM | POA: Insufficient documentation

## 2018-09-30 DIAGNOSIS — D123 Benign neoplasm of transverse colon: Secondary | ICD-10-CM | POA: Diagnosis not present

## 2018-09-30 DIAGNOSIS — K259 Gastric ulcer, unspecified as acute or chronic, without hemorrhage or perforation: Secondary | ICD-10-CM | POA: Diagnosis not present

## 2018-09-30 DIAGNOSIS — K2980 Duodenitis without bleeding: Secondary | ICD-10-CM | POA: Diagnosis not present

## 2018-09-30 DIAGNOSIS — Z923 Personal history of irradiation: Secondary | ICD-10-CM | POA: Insufficient documentation

## 2018-09-30 DIAGNOSIS — I10 Essential (primary) hypertension: Secondary | ICD-10-CM | POA: Insufficient documentation

## 2018-09-30 DIAGNOSIS — Z803 Family history of malignant neoplasm of breast: Secondary | ICD-10-CM | POA: Diagnosis not present

## 2018-09-30 DIAGNOSIS — Z791 Long term (current) use of non-steroidal anti-inflammatories (NSAID): Secondary | ICD-10-CM | POA: Diagnosis not present

## 2018-09-30 DIAGNOSIS — K2960 Other gastritis without bleeding: Secondary | ICD-10-CM | POA: Diagnosis not present

## 2018-09-30 HISTORY — DX: Chronic obstructive pulmonary disease, unspecified: J44.9

## 2018-09-30 HISTORY — DX: Fatty (change of) liver, not elsewhere classified: K76.0

## 2018-09-30 HISTORY — DX: Personal history of other diseases of the digestive system: Z87.19

## 2018-09-30 HISTORY — DX: Non-Hodgkin lymphoma, unspecified, unspecified site: C85.90

## 2018-09-30 HISTORY — PX: ESOPHAGOGASTRODUODENOSCOPY: SHX5428

## 2018-09-30 HISTORY — DX: Anemia, unspecified: D64.9

## 2018-09-30 HISTORY — DX: Gastro-esophageal reflux disease without esophagitis: K21.9

## 2018-09-30 HISTORY — DX: Dysphagia, unspecified: R13.10

## 2018-09-30 HISTORY — DX: Other intra-abdominal and pelvic swelling, mass and lump: R19.09

## 2018-09-30 HISTORY — PX: COLONOSCOPY WITH PROPOFOL: SHX5780

## 2018-09-30 SURGERY — EGD (ESOPHAGOGASTRODUODENOSCOPY)
Anesthesia: General

## 2018-09-30 MED ORDER — EPHEDRINE SULFATE 50 MG/ML IJ SOLN
INTRAMUSCULAR | Status: AC
  Filled 2018-09-30: qty 1

## 2018-09-30 MED ORDER — FENTANYL CITRATE (PF) 100 MCG/2ML IJ SOLN
INTRAMUSCULAR | Status: DC | PRN
  Administered 2018-09-30 (×2): 25 ug via INTRAVENOUS
  Administered 2018-09-30: 50 ug via INTRAVENOUS

## 2018-09-30 MED ORDER — MIDAZOLAM HCL 2 MG/2ML IJ SOLN
INTRAMUSCULAR | Status: DC | PRN
  Administered 2018-09-30: 2 mg via INTRAVENOUS

## 2018-09-30 MED ORDER — FENTANYL CITRATE (PF) 100 MCG/2ML IJ SOLN
INTRAMUSCULAR | Status: AC
  Filled 2018-09-30: qty 2

## 2018-09-30 MED ORDER — BUTAMBEN-TETRACAINE-BENZOCAINE 2-2-14 % EX AERO
INHALATION_SPRAY | CUTANEOUS | Status: DC | PRN
  Administered 2018-09-30: 2 via TOPICAL

## 2018-09-30 MED ORDER — SODIUM CHLORIDE 0.9 % IV SOLN: INTRAVENOUS | Status: DC

## 2018-09-30 MED ORDER — LIDOCAINE HCL (CARDIAC) PF 100 MG/5ML IV SOSY
PREFILLED_SYRINGE | INTRAVENOUS | Status: DC | PRN
  Administered 2018-09-30: 30 mg via INTRAVENOUS

## 2018-09-30 MED ORDER — PROPOFOL 500 MG/50ML IV EMUL
INTRAVENOUS | Status: AC
  Filled 2018-09-30: qty 50

## 2018-09-30 MED ORDER — PROPOFOL 10 MG/ML IV BOLUS
INTRAVENOUS | Status: AC
  Filled 2018-09-30: qty 20

## 2018-09-30 MED ORDER — MIDAZOLAM HCL 2 MG/2ML IJ SOLN
INTRAMUSCULAR | Status: AC
  Filled 2018-09-30: qty 2

## 2018-09-30 MED ORDER — SODIUM CHLORIDE 0.9 % IV SOLN
INTRAVENOUS | Status: DC
  Administered 2018-09-30: 1000 mL via INTRAVENOUS

## 2018-09-30 MED ORDER — PROPOFOL 500 MG/50ML IV EMUL
INTRAVENOUS | Status: DC | PRN
  Administered 2018-09-30: 120 ug/kg/min via INTRAVENOUS

## 2018-09-30 MED ORDER — EPHEDRINE SULFATE 50 MG/ML IJ SOLN
INTRAMUSCULAR | Status: DC | PRN
  Administered 2018-09-30: 10 mg via INTRAVENOUS

## 2018-09-30 NOTE — H&P (Signed)
Primary Care Physician:  [place] Pink, MD Primary Gastroenterologist:  Dr. Vira Agar  Pre-Procedure History & Physical: HPI:  Kristen Simon is a 68 y.o. female is here for an endoscopy and colonoscopy.   Past Medical History:  Diagnosis Date  . Allergy   . Anemia   . Breast screening, unspecified   . COPD (chronic obstructive pulmonary disease) ([place])   . Dysphagia   . Elevated cholesterol   . Fatty liver   . GERD (gastroesophageal reflux disease)   . Groin mass   . History of cancer chemotherapy 2013  . History of rectal bleeding   . Hx of radiation therapy 2013  . Hypertension 2010  . Internal hemorrhoids with other complication 9470  . Lymphoma ([place])   . Personal history of chemotherapy   . Personal history of radiation therapy   . Personal history of tobacco use, presenting hazards to health   . 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    lymphoma 2013  . Ulcer 1970   stomach-resolved  . Unilateral or unspecified femoral hernia with obstruction     Past Surgical History:  Procedure Laterality Date  . ANOSCOPY  03/27/2012   anal biopsy/hemorrhoid-benign  . APPENDECTOMY  1970  . FRACTURE SURGERY  9628,3662   right wrist,right leg  . GROIN MASS OPEN BIOPSY Right 01/24/2012   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  . HERNIA REPAIR Right 01/10/2012   right femerol hernia  . PATELLA FRACTURE SURGERY Right 1982  . WRIST SURGERY Right     Prior to Admission medications   Medication Sig Start Date End Date Taking? Authorizing Provider  Acetaminophen (TYLENOL ARTHRITIS PAIN PO) Take 650 mg 3 (three) times daily by mouth.   Yes [provider]  albuterol (PROVENTIL HFA;VENTOLIN HFA) 108 (90 BASE) MCG/ACT inhaler Inhale into the lungs every 6 (six) hours as needed for wheezing or shortness of breath.   Yes [provider]  amLODipine (NORVASC) 2.5 MG tablet  Take 7.5 mg by mouth daily.    Yes [provider]  aspirin 81 MG tablet Take 81 mg by mouth daily.   Yes [provider]  budesonide-formoterol (SYMBICORT) 80-4.5 MCG/ACT inhaler Inhale 2 puffs into the lungs 2 (two) times daily.   Yes [provider]  Calcium Carbonate-Vit D-Min (CALTRATE 600+D PLUS MINERALS) 600-800 MG-UNIT CHEW Chew 1 tablet 3 (three) times a week by mouth.   Yes [provider]  cyclobenzaprine (FLEXERIL) 5 MG tablet Take 5 mg by mouth daily as needed for muscle pain. 12/06/16  Yes [provider]  fluticasone (FLONASE) 50 MCG/ACT nasal spray Place 2 sprays into the nose daily. 04/05/15 12/22/56 Yes [provider]  Lactobacillus ([employer] PROBIOTIC ACIDOPHILUS) 10 MG CAPS Take 1 tablet by mouth daily.   Yes [provider]  loratadine (CLARITIN REDITABS) 10 MG dissolvable tablet Take 10 mg by mouth daily.   Yes [provider]  magnesium 30 MG tablet Take 30 mg by mouth 2 (two) times daily.   Yes [provider]  meloxicam (MOBIC) 15 MG tablet Take 15 mg by mouth daily.  12/07/17  Yes [provider]  metoprolol succinate (TOPROL-XL) 25 MG 24 hr tablet Start 1/2 qd ,may increase to 1 po qd after 2 weeks if needed 05/01/18  Yes [provider]  montelukast (SINGULAIR) 10 MG tablet Take 10 mg by mouth at bedtime.   Yes [provider]  senna-docusate (SENOKOT-S) 8.6-50 MG tablet Take 1 tablet by mouth at bedtime as needed. 12/23/15  Yes Forest Gleason, MD    Allergies as of 09/02/2018  . (No Known Allergies)    Family History  Problem Relation Age of Onset  . Cancer Mother        lung  . Breast cancer Maternal Aunt     Social History   Socioeconomic History  . Marital status: Married    Spouse name: Not on file  . Number of children: Not on file  . Years of education: Not on file  . Highest education level: Not on file  Occupational History  . Not on file  Social  Needs  . Financial resource strain: Not on file  . Food insecurity:    Worry: Not on file    Inability: Not on file  . Transportation needs:    Medical: Not on file    Non-medical: Not on file  Tobacco Use  . Smoking status: Former Smoker    Packs/day: 0.88    Years: 40.00    Pack years: 35.20    Last attempt to quit: 08/01/2015    Years since quitting: 3.1  . Smokeless tobacco: Never Used  Substance and Sexual Activity  . Alcohol use: Yes  . Drug use: No  . Sexual activity: Never  Lifestyle  . Physical activity:    Days per week: Not on file    Minutes per session: Not on file  . Stress: Not on file  Relationships  . Social connections:    Talks on phone: Not on file    Gets together: Not on file    Attends religious service: Not on file    Active member of club or organization: Not on file    Attends meetings of clubs or organizations: Not on file    Relationship status: Not on file  . Intimate partner violence:    Fear of current or ex partner: Not on file    Emotionally abused: Not on file    Physically abused: Not on file    Forced sexual activity: Not on file  Other Topics Concern  . Not on file  Social History Narrative  . Not on file    Review of Systems: See HPI, otherwise negative ROS  Physical Exam: BP 121/83   Pulse 95   Temp (!) 96.3 F (35.7 C) (Tympanic)   Resp 20   Ht 5\' 2"  (1.575 m)   Wt 74.8 kg   SpO2 97%   BMI 30.18 kg/m  General:   Alert,  pleasant and cooperative in NAD Head:  Normocephalic and atraumatic. Neck:  Supple; no masses or thyromegaly. Lungs:  Clear throughout to auscultation.    Heart:  Regular rate and rhythm. Abdomen:  Soft, nontender and nondistended. Normal bowel sounds, without guarding, and without rebound.   Neurologic:  Alert and  oriented x4;  grossly normal neurologically.  Impression/Plan: Kristen Simon is here for an endoscopy and colonoscopy to be performed for dysphagia and rectal  bleeding.  Risks, benefits, limitations, and alternatives regarding  endoscopy and colonoscopy have been reviewed with the patient.  Questions have been answered.  All parties agreeable.   Gaylyn Cheers, MD  09/30/2018, 12:29 PM

## 2018-09-30 NOTE — Transfer of Care (Signed)
   Immediate Anesthesia Transfer of Care Note  Patient: Kristen Simon  Procedure(s) Performed: ESOPHAGOGASTRODUODENOSCOPY (EGD) (N/A ) COLONOSCOPY WITH PROPOFOL (N/A )  Patient Location: PACU  Anesthesia Type:General  Level of Consciousness: awake and sedated  Airway & Oxygen Therapy: Patient Spontanous Breathing and Patient connected to nasal cannula oxygen  Post-op Assessment: Report given to RN and Post -op Vital signs reviewed and stable  Post vital signs: Reviewed and stable  Last Vitals:  Vitals Value Taken Time  BP    Temp    Pulse    Resp    SpO2      Last Pain:  Vitals:   09/30/18 1152  TempSrc: Tympanic  PainSc: 0-No pain         Complications: No apparent anesthesia complications

## 2018-09-30 NOTE — Op Note (Signed)
[facility] Gastroenterology Patient Name: Kristen Simon Procedure Date: 09/30/2018 12:22 PM MRN: [id number] Account #: [id number] Date of Birth: Nov 07, 1950 Admit Type: Outpatient Age: 68 Room: [facility] ENDO ROOM 1 Gender: Female Note Status: Finalized Procedure:            Colonoscopy Indications:          Rectal bleeding Providers:            Manya Silvas, MD Referring MD:         Irven Easterly. Kary Kos, MD (Referring MD) Medicines:            Propofol per Anesthesia Complications:        No immediate complications. Procedure:            Pre-Anesthesia Assessment:                       - After reviewing the risks and benefits, the patient                        was deemed in satisfactory condition to undergo the                        procedure.                       After obtaining informed consent, the colonoscope was                        passed under direct vision. Throughout the procedure,                        the patient's blood pressure, pulse, and oxygen                        saturations were monitored continuously. The                        Colonoscope was introduced through the anus and                        advanced to the the cecum, identified by appendiceal                        orifice and ileocecal valve. The colonoscopy was                        somewhat difficult due to a redundant colon and a                        tortuous colon. Successful completion of the procedure                        was aided by applying abdominal pressure. The patient                        tolerated the procedure well. Findings:      Ileocecal valve entered showing terminal ileum.      A small polyp was found in the transverse colon. The polyp was sessile.       The polyp was removed with a hot snare. Resection and retrieval were  complete.      A diminutive polyp was found in the sigmoid colon. The polyp was       sessile. The polyp was removed with a  jumbo cold forceps. Resection and       retrieval were complete.      A diminutive polyp was found in the sigmoid colon. The polyp was       sessile. The polyp was removed with a hot snare. Resection and retrieval       were complete.      A diminutive polyp was found in the rectum. The polyp was sessile. The       polyp was removed with a jumbo cold forceps. Resection and retrieval       were complete. Impression:           - One small polyp in the transverse colon, removed with                        a hot snare. Resected and retrieved.                       - One diminutive polyp in the sigmoid colon, removed                        with a jumbo cold forceps. Resected and retrieved.                       - One diminutive polyp in the sigmoid colon, removed                        with a hot snare. Resected and retrieved.                       - One diminutive polyp in the rectum, removed with a                        jumbo cold forceps. Resected and retrieved. Recommendation:       - Await pathology results. Manya Silvas, MD 09/30/2018 1:22:47 PM This report has been signed electronically. Number of Addenda: 0 Note Initiated On: 09/30/2018 12:22 PM Scope Withdrawal Time: 0 hours 13 minutes 37 seconds  Total Procedure Duration: 0 hours 26 minutes 12 seconds       [facility]

## 2018-09-30 NOTE — Anesthesia Procedure Notes (Signed)
Performed by: Cook-Martin, Neveyah Garzon Pre-anesthesia Checklist: Patient identified, Emergency Drugs available, Suction available, Patient being monitored and Timeout performed Patient Re-evaluated:Patient Re-evaluated prior to induction Oxygen Delivery Method: Nasal cannula Preoxygenation: Pre-oxygenation with 100% oxygen Induction Type: IV induction Airway Equipment and Method: Bite block Placement Confirmation: positive ETCO2 and CO2 detector       

## 2018-09-30 NOTE — Op Note (Signed)
[facility] Gastroenterology Patient Name: Kristen Simon Procedure Date: 09/30/2018 12:22 PM MRN: [id number] Account #: [id number] Date of Birth: 10-31-1950 Admit Type: Outpatient Age: 68 Room: [facility] ENDO ROOM 1 Gender: Female Note Status: Finalized Procedure:            Upper GI endoscopy Indications:          Dysphagia Providers:            Manya Silvas, MD Referring MD:         Irven Easterly. Kary Kos, MD (Referring MD) Medicines:            Propofol per Anesthesia Complications:        No immediate complications. Procedure:            Pre-Anesthesia Assessment:                       - After reviewing the risks and benefits, the patient                        was deemed in satisfactory condition to undergo the                        procedure.                       After obtaining informed consent, the endoscope was                        passed under direct vision. Throughout the procedure,                        the patient's blood pressure, pulse, and oxygen                        saturations were monitored continuously. The Endoscope                        was introduced through the mouth, and advanced to the                        second part of duodenum. The upper GI endoscopy was                        accomplished without difficulty. The patient tolerated                        the procedure well. Findings:      LA Grade A (one or more mucosal breaks less than 5 mm, not extending       between tops of 2 mucosal folds) esophagitis with no bleeding was found       40 cm from the incisors. Biopsies were taken with a cold forceps for       histology.      One non-bleeding cratered gastric ulcer with no stigmata of bleeding was       found in the gastric antrum. The lesion was 10 mm in largest dimension.       Biopsies were taken with a cold forceps for histology.      A few dispersed, diminutive non-bleeding erosions were found in the       gastric  antrum. There were no  stigmata of recent bleeding. Biopsies were       taken with a cold forceps for histology. Biopsies were taken with a cold       forceps for Helicobacter pylori testing.      Patchy mild inflammation characterized by erythema and granularity was       found in the duodenal bulb. Impression:           - LA Grade A reflux esophagitis. Rule out Barrett's                        esophagus. Biopsied.                       - Non-bleeding gastric ulcer with no stigmata of                        bleeding. Biopsied.                       - Non-bleeding erosive gastropathy. Biopsied.                       - Duodenitis. Recommendation:       - Await pathology results. Manya Silvas, MD 09/30/2018 12:48:42 PM This report has been signed electronically. Number of Addenda: 0 Note Initiated On: 09/30/2018 12:22 PM      [facility]

## 2018-09-30 NOTE — Anesthesia Post-op Follow-up Note (Signed)
Anesthesia QCDR form completed.        

## 2018-09-30 NOTE — Anesthesia Preprocedure Evaluation (Signed)
Anesthesia Evaluation  Patient identified by MRN, date of birth, ID band Patient awake    Reviewed: Allergy & Precautions, H&P , NPO status , Patient's Chart, lab work & pertinent test results, reviewed documented beta blocker date and time   Airway Mallampati: II   Neck ROM: full    Dental  (+) Poor Dentition   Pulmonary neg pulmonary ROS, shortness of breath and with exertion, COPD, former smoker,    Pulmonary exam normal        Cardiovascular Exercise Tolerance: Poor hypertension, On Medications negative cardio ROS Normal cardiovascular exam Rhythm:regular Rate:Normal     Neuro/Psych negative neurological ROS  negative psych ROS   GI/Hepatic negative GI ROS, Neg liver ROS, GERD  ,  Endo/Other  negative endocrine ROS  Renal/GU negative Renal ROS  negative genitourinary   Musculoskeletal   Abdominal   Peds  Hematology negative hematology ROS (+) Blood dyscrasia, anemia ,   Anesthesia Other Findings Past Medical History: No date: Allergy No date: Anemia No date: Breast screening, unspecified No date: COPD (chronic obstructive pulmonary disease) ([facility]) No date: Dysphagia No date: Elevated cholesterol No date: Fatty liver No date: GERD (gastroesophageal reflux disease) No date: Groin mass 2013: History of cancer chemotherapy No date: History of rectal bleeding 2013: Hx of radiation therapy 2010: Hypertension 2013: Internal hemorrhoids with other complication No date: Lymphoma ([place]) No date: Personal history of chemotherapy No date: Personal history of radiation therapy No date: Personal history of tobacco use, presenting hazards to health No date: Secondary and unspecified malignant neoplasm of lymph nodes  of inguinal region and lower limb     Comment:  lymphoma 2013 1970: Ulcer     Comment:  stomach-resolved No date: Unilateral or unspecified femoral hernia with obstruction Past Surgical  History: 03/27/2012: ANOSCOPY     Comment:  anal biopsy/hemorrhoid-benign 1970: APPENDECTOMY 4540,9811: FRACTURE SURGERY     Comment:  right wrist,right leg 01/24/2012: GROIN MASS OPEN BIOPSY; Right     Comment:  3cm-lymph node with metastatic poorly differentiated               squamous cell carcinoma 01/10/2012: HERNIA REPAIR; Right     Comment:  right femerol hernia 1982: PATELLA FRACTURE SURGERY; Right No date: WRIST SURGERY; Right BMI    Body Mass Index:  30.18 kg/m     Reproductive/Obstetrics negative OB ROS                             Anesthesia Physical Anesthesia Plan  ASA: III  Anesthesia Plan: General   Post-op Pain Management:    Induction:   PONV Risk Score and Plan:   Airway Management Planned:   Additional Equipment:   Intra-op Plan:   Post-operative Plan:   Informed Consent: I have reviewed the patients History and Physical, chart, labs and discussed the procedure including the risks, benefits and alternatives for the proposed anesthesia with the patient or authorized representative who has indicated his/her understanding and acceptance.     Dental Advisory Given  Plan Discussed with: CRNA  Anesthesia Plan Comments:         Anesthesia Quick Evaluation

## 2018-10-01 LAB — SURGICAL PATHOLOGY

## 2018-10-02 ENCOUNTER — Encounter: Payer: Self-pay | Admitting: Unknown Physician Specialty

## 2018-10-04 NOTE — Anesthesia Postprocedure Evaluation (Signed)
Anesthesia Post Note  Patient: [name]  Procedure(s) Performed: ESOPHAGOGASTRODUODENOSCOPY (EGD) (N/A ) COLONOSCOPY WITH PROPOFOL (N/A )  Patient location during evaluation: PACU Anesthesia Type: General Level of consciousness: awake and alert Pain management: pain level controlled Vital Signs Assessment: post-procedure vital signs reviewed and stable Respiratory status: spontaneous breathing, nonlabored ventilation, respiratory function stable and patient connected to nasal cannula oxygen Cardiovascular status: blood pressure returned to baseline and stable Postop Assessment: no apparent nausea or vomiting Anesthetic complications: no     Last Vitals:  Vitals:   09/30/18 1325 09/30/18 1335  BP: (!) 105/50 (!) 102/43  Pulse: 90   Resp: 20   Temp: (!) 36.3 C   SpO2: 97%     Last Pain:  Vitals:   10/01/18 0733  TempSrc:   PainSc: 0-No pain                 Molli Barrows

## 2018-10-24 DIAGNOSIS — R252 Cramp and spasm: Secondary | ICD-10-CM | POA: Diagnosis not present

## 2018-10-24 DIAGNOSIS — K760 Fatty (change of) liver, not elsewhere classified: Secondary | ICD-10-CM | POA: Diagnosis not present

## 2018-10-24 DIAGNOSIS — R1319 Other dysphagia: Secondary | ICD-10-CM | POA: Diagnosis not present

## 2018-10-24 DIAGNOSIS — K219 Gastro-esophageal reflux disease without esophagitis: Secondary | ICD-10-CM | POA: Diagnosis not present

## 2018-10-28 DIAGNOSIS — I10 Essential (primary) hypertension: Secondary | ICD-10-CM | POA: Diagnosis not present

## 2018-10-28 DIAGNOSIS — R252 Cramp and spasm: Secondary | ICD-10-CM | POA: Diagnosis not present

## 2018-11-10 ENCOUNTER — Encounter: Payer: Self-pay | Admitting: Emergency Medicine

## 2018-11-10 ENCOUNTER — Emergency Department: Payer: 59

## 2018-11-10 ENCOUNTER — Emergency Department
Admission: EM | Admit: 2018-11-10 | Discharge: 2018-11-10 | Disposition: A | Discharge status: Home / Self Care | Payer: 59 | Attending: Emergency Medicine | Admitting: Emergency Medicine

## 2018-11-10 ENCOUNTER — Other Ambulatory Visit: Payer: Self-pay

## 2018-11-10 DIAGNOSIS — M25512 Pain in left shoulder: Secondary | ICD-10-CM | POA: Diagnosis not present

## 2018-11-10 DIAGNOSIS — E785 Hyperlipidemia, unspecified: Secondary | ICD-10-CM | POA: Diagnosis not present

## 2018-11-10 DIAGNOSIS — M542 Cervicalgia: Secondary | ICD-10-CM | POA: Diagnosis not present

## 2018-11-10 DIAGNOSIS — J449 Chronic obstructive pulmonary disease, unspecified: Secondary | ICD-10-CM | POA: Diagnosis not present

## 2018-11-10 DIAGNOSIS — Z79899 Other long term (current) drug therapy: Secondary | ICD-10-CM | POA: Diagnosis not present

## 2018-11-10 DIAGNOSIS — I10 Essential (primary) hypertension: Secondary | ICD-10-CM

## 2018-11-10 DIAGNOSIS — R0602 Shortness of breath: Secondary | ICD-10-CM | POA: Diagnosis not present

## 2018-11-10 DIAGNOSIS — R51 Headache: Secondary | ICD-10-CM | POA: Diagnosis not present

## 2018-11-10 DIAGNOSIS — R05 Cough: Secondary | ICD-10-CM | POA: Diagnosis not present

## 2018-11-10 DIAGNOSIS — R519 Headache, unspecified: Secondary | ICD-10-CM

## 2018-11-10 LAB — COMPREHENSIVE METABOLIC PANEL
ALT: 31 U/L (ref 0–44)
AST: 29 U/L (ref 15–41)
Albumin: 4.4 g/dL (ref 3.5–5.0)
Alkaline Phosphatase: 45 U/L (ref 38–126)
Anion gap: 9 (ref 5–15)
BUN: 13 mg/dL (ref 8–23)
CO2: 25 mmol/L (ref 22–32)
Calcium: 9.7 mg/dL (ref 8.9–10.3)
Chloride: 106 mmol/L (ref 98–111)
Creatinine, Ser: 0.56 mg/dL (ref 0.44–1.00)
GFR calc Af Amer: >60 mL/min (ref >60)
GFR calc non Af Amer: >60 mL/min (ref >60)
Glucose, Bld: 107 mg/dL — ABNORMAL HIGH (ref 70–99)
Potassium: 3.9 mmol/L (ref 3.5–5.1)
Sodium: 140 mmol/L (ref 135–145)
Total Bilirubin: 0.3 mg/dL (ref 0.3–1.2)
Total Protein: 7.3 g/dL (ref 6.5–8.1)

## 2018-11-10 LAB — CBC
HCT: 39.2 % (ref 36.0–46.0)
Hemoglobin: 13.5 g/dL (ref 12.0–15.0)
MCH: 30.3 pg (ref 26.0–34.0)
MCHC: 34.4 g/dL (ref 30.0–36.0)
MCV: 88.1 fL (ref 80.0–100.0)
Platelets: 289 10*3/uL (ref 150–400)
RBC: 4.45 MIL/uL (ref 3.87–5.11)
RDW: 12.3 % (ref 11.5–15.5)
WBC: 4.1 10*3/uL (ref 4.0–10.5)
nRBC: 0 % (ref 0.0–0.2)

## 2018-11-10 LAB — TROPONIN I: Troponin I: <0.03 ng/mL (ref <0.03)

## 2018-11-10 MED ORDER — DIPHENHYDRAMINE HCL 50 MG/ML IJ SOLN
25.0000 mg | Freq: Once | INTRAMUSCULAR | Status: AC
  Administered 2018-11-10: 25 mg via INTRAVENOUS
  Filled 2018-11-10: qty 1

## 2018-11-10 MED ORDER — CLONIDINE HCL 0.1 MG PO TABS: 0.1000 mg | ORAL_TABLET | Freq: Every day | ORAL | 0 refills | Status: DC | PRN

## 2018-11-10 MED ORDER — KETOROLAC TROMETHAMINE 30 MG/ML IJ SOLN
15.0000 mg | Freq: Once | INTRAMUSCULAR | Status: AC
  Administered 2018-11-10: 15 mg via INTRAVENOUS
  Filled 2018-11-10: qty 1

## 2018-11-10 MED ORDER — BUTALBITAL-APAP-CAFFEINE 50-325-40 MG PO TABS: 1.0000 | ORAL_TABLET | Freq: Four times a day (QID) | ORAL | 0 refills | Status: DC | PRN

## 2018-11-10 MED ORDER — BUTALBITAL-APAP-CAFFEINE 50-325-40 MG PO TABS: 2.0000 | ORAL_TABLET | Freq: Once | ORAL | Status: DC

## 2018-11-10 MED ORDER — METOCLOPRAMIDE HCL 5 MG/ML IJ SOLN
10.0000 mg | Freq: Once | INTRAMUSCULAR | Status: AC
  Administered 2018-11-10: 10 mg via INTRAVENOUS
  Filled 2018-11-10: qty 2

## 2018-11-10 MED ORDER — SODIUM CHLORIDE 0.9 % IV BOLUS
500.0000 mL | Freq: Once | INTRAVENOUS | Status: AC
  Administered 2018-11-10: 500 mL via INTRAVENOUS

## 2018-11-10 NOTE — ED Triage Notes (Signed)
Changed from amlodipine to losartan on march 3.  Pt denies chest pain. Here for [facility] and left arm pain.  Checked bp at home today and was 200s/100s.  Pain to left arm is intermittent today.    Husband reports when he got home from work wife reports jaw was hurting and left arm was hurting and she seemed disoriented and was off balance when walking.  He reports she was having difficulty completing sentences.  This has improved per husband.

## 2018-11-10 NOTE — ED Provider Notes (Signed)
[facility] Emergency Department Provider Note  Time seen: 11:33 AM  I have reviewed the triage vital signs and the nursing notes.   HISTORY  Chief Complaint Hypertension and Shortness of Breath   HPI Kristen Simon is a 68 y.o. female with a past medical history of allergies, COPD, gastric reflux, hypertension, hyperlipidemia, lymphoma, presents to the emergency department with elevated blood pressure, left shoulder and left neck pain as well as a headache.  According to the patient for the past several weeks she has been experiencing intermittent headaches described as moderate to severe global headache.  Patient states this morning she was experiencing a headache as well, checked her blood pressure and noted it to be elevated greater than 200/100.  Patient states she became concerned so she came to the emergency department for evaluation.  Patient is also complaining of left shoulder and left neck discomfort.  Patient states this is longstanding and intermittent but seems somewhat worse today and along with the patient's high blood pressure she was concerned so she came to the ER for evaluation.     Past Medical History:  Diagnosis Date  . Allergy   . Anemia   . Breast screening, unspecified   . COPD (chronic obstructive pulmonary disease) ([place])   . Dysphagia   . Elevated cholesterol   . Fatty liver   . GERD (gastroesophageal reflux disease)   . Groin mass   . History of cancer chemotherapy 2013  . History of rectal bleeding   . Hx of radiation therapy 2013  . Hypertension 2010  . Internal hemorrhoids with other complication 2585  . Lymphoma ([place])   . Personal history of chemotherapy   . Personal history of radiation therapy   . Personal history of tobacco use, presenting hazards to health   . 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    lymphoma 2013  . Ulcer 1970   stomach-resolved  .  Unilateral or unspecified femoral hernia with obstruction     Patient Active Problem List   Diagnosis Date Noted  . Long term current use of opiate analgesic 03/19/2017  . Long term prescription opiate use 03/19/2017  . Opiate use 03/19/2017  . Chronic pain syndrome 03/19/2017  . Chronic low back pain, unspecified back pain laterality, with sciatica presence unspecified (primary) (bilateral) (R>L) 03/19/2017  . Chronic neck pain 03/19/2017  . Pain in both lower extremities (tertiary)(R>L) 03/19/2017  . Bilateral groin pain (secondary) (R>L) 03/19/2017  . Sacroiliac joint pain 03/19/2017  . Primary cervical cancer ([place]) 12/29/2016  . Personal history of tobacco use, presenting hazards to health 01/13/2016  . Benign essential HTN 01/29/2015  . Mixed hyperlipidemia 01/29/2015  . Shortness of breath 01/29/2015  . Cough 01/02/2014  . Secondary malignant neoplasm of lymph nodes of inguinal region or lower extremity ([place])   . History of cancer chemotherapy     Past Surgical History:  Procedure Laterality Date  . ANOSCOPY  03/27/2012   anal biopsy/hemorrhoid-benign  . APPENDECTOMY  1970  . COLONOSCOPY WITH PROPOFOL N/A 09/30/2018   Procedure: COLONOSCOPY WITH PROPOFOL;  Surgeon: Manya Silvas, MD;  Location: [facility] ENDOSCOPY;  Service: Endoscopy;  Laterality: N/A;  . ESOPHAGOGASTRODUODENOSCOPY N/A 09/30/2018   Procedure: ESOPHAGOGASTRODUODENOSCOPY (EGD);  Surgeon: Manya Silvas, MD;  Location: [facility] ENDOSCOPY;  Service: Endoscopy;  Laterality: N/A;  . FRACTURE SURGERY  2778,2423   right wrist,right leg  . GROIN MASS OPEN BIOPSY Right 01/24/2012  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  . HERNIA REPAIR Right 01/10/2012   right femerol hernia  . PATELLA FRACTURE SURGERY Right 1982  . WRIST SURGERY Right     Prior to Admission medications   Medication Sig Start Date End Date Taking? Authorizing Provider  Acetaminophen (TYLENOL ARTHRITIS PAIN PO)  Take 650 mg 3 (three) times daily by mouth.    [provider]  albuterol (PROVENTIL HFA;VENTOLIN HFA) 108 (90 BASE) MCG/ACT inhaler Inhale into the lungs every 6 (six) hours as needed for wheezing or shortness of breath.    [provider]  amLODipine (NORVASC) 2.5 MG tablet Take 7.5 mg by mouth daily.     [provider]  aspirin 81 MG tablet Take 81 mg by mouth daily.    [provider]  budesonide-formoterol (SYMBICORT) 80-4.5 MCG/ACT inhaler Inhale 2 puffs into the lungs 2 (two) times daily.    [provider]  Calcium Carbonate-Vit D-Min (CALTRATE 600+D PLUS MINERALS) 600-800 MG-UNIT CHEW Chew 1 tablet 3 (three) times a week by mouth.    [provider]  cyclobenzaprine (FLEXERIL) 5 MG tablet Take 5 mg by mouth daily as needed for muscle pain. 12/06/16   [provider]  fluticasone (FLONASE) 50 MCG/ACT nasal spray Place 2 sprays into the nose daily. 04/05/15 12/22/56  [provider]  Lactobacillus ([employer] PROBIOTIC ACIDOPHILUS) 10 MG CAPS Take 1 tablet by mouth daily.    [provider]  loratadine (CLARITIN REDITABS) 10 MG dissolvable tablet Take 10 mg by mouth daily.    [provider]  magnesium 30 MG tablet Take 30 mg by mouth 2 (two) times daily.    [provider]  meloxicam (MOBIC) 15 MG tablet Take 15 mg by mouth daily.  12/07/17   [provider]  metoprolol succinate (TOPROL-XL) 25 MG 24 hr tablet Start 1/2 qd ,may increase to 1 po qd after 2 weeks if needed 05/01/18   [provider]  montelukast (SINGULAIR) 10 MG tablet Take 10 mg by mouth at bedtime.    [provider]  senna-docusate (SENOKOT-S) 8.6-50 MG tablet Take 1 tablet by mouth at bedtime as needed. 12/23/15   Forest Gleason, MD    Allergies  Allergen Reactions  . Statins     Cramping, hives    Family History  Problem Relation Age of Onset  . Cancer Mother        lung  . Breast cancer Maternal Aunt      Social History Social History   Tobacco Use  . Smoking status: Former Smoker    Packs/day: 0.88    Years: 40.00    Pack years: 35.20    Last attempt to quit: 08/01/2015    Years since quitting: 3.2  . Smokeless tobacco: Never Used  Substance Use Topics  . Alcohol use: Yes  . Drug use: No    Review of Systems Constitutional: Negative for fever. Cardiovascular: Negative for chest pain. Respiratory: Negative for shortness of breath. Gastrointestinal: Negative for abdominal pain, vomiting  Musculoskeletal: Negative for musculoskeletal complaints Neurological: Negative for headache All other ROS negative  ____________________________________________   PHYSICAL EXAM:  VITAL SIGNS: ED Triage Vitals  Enc Vitals Group     BP 11/10/18 0915 (!) 209/87     Pulse Rate 11/10/18 0913 89     Resp 11/10/18 0913 (!) 24     Temp 11/10/18 0915 98.1 F (36.7 C)     Temp Source 11/10/18 0915 Oral  SpO2 11/10/18 0915 95 %     Weight 11/10/18 0915 165 lb (74.8 kg)     Height 11/10/18 0915 5\' 2"  (1.575 m)     Head Circumference --      Peak Flow --      Pain Score 11/10/18 0913 6     Pain Loc --      Pain Edu? --      Excl. in [place]? --    Constitutional: Alert and oriented. Well appearing and in no distress. Eyes: Normal exam ENT   Head: Normocephalic and atraumatic.   Mouth/Throat: Mucous membranes are moist. Cardiovascular: Normal rate, regular rhythm. No murmur Respiratory: Normal respiratory effort without tachypnea nor retractions. Breath sounds are clear  Gastrointestinal: Soft and nontender. No distention.  Musculoskeletal: Mild left neck/shoulder tenderness to palpation.  No chest wall tenderness. Neurologic:  Normal speech and language. No gross focal neurologic deficits  Skin:  Skin is warm, dry and intact.  Psychiatric: Mood and affect are normal.  ____________________________________________    EKG  EKG viewed and interpreted by myself shows a normal  sinus rhythm 89 bpm with a narrow QRS, normal axis, normal intervals, no concerning ST changes.  ____________________________________________    RADIOLOGY  IMPRESSION: No evidence of acute intracranial abnormality.  Mild atrophy and chronic small-vessel white matter ischemic changes.    IMPRESSION: No active cardiopulmonary disease.  ____________________________________________   INITIAL IMPRESSION / ASSESSMENT AND PLAN / ED COURSE  Pertinent labs & imaging results that were available during my care of the patient were reviewed by me and considered in my medical decision making (see chart for details).  Patient presents emergency department with complaints of elevated blood pressure, intermittent headache, left shoulder and neck pain.  Differential at this time would include hypertension, ACS, ICH.  We will check labs, chest x-ray, EKG.  Given the patient's headache and significant hypertension we will obtain CT imaging of the head.  We will treat the patient's headache with Toradol, Reglan, Benadryl, IV fluids and continue to closely monitor.  Patient agreeable to plan of care.  Patient does state her doctor recently took her off amlodipine 1 week ago due to muscle cramps she was experiencing on that medication.  Patient is taking losartan currently.  Patient states she is feeling better after headache medications.  Patient's labs are largely within normal limits including negative troponin.  CT scan of the head is negative.  Chest x-ray is normal.  We will discharge patient home with Fioricet to be used as needed.  Given the patient's significant hypertension over the last week or so we will discharge with a PRN clonidine.  I discussed this medication with the patient to be taken only if blood pressure greater 180/110.  Patient is to follow-up with her doctor this week regarding her blood pressure.  ____________________________________________   FINAL CLINICAL IMPRESSION(S) / ED  DIAGNOSES  Hypertension Headache Neck/shoulder pain   Harvest Dark, MD 11/10/18 1409

## 2018-11-11 DIAGNOSIS — I10 Essential (primary) hypertension: Secondary | ICD-10-CM | POA: Diagnosis not present

## 2018-11-12 DIAGNOSIS — I10 Essential (primary) hypertension: Secondary | ICD-10-CM | POA: Diagnosis not present

## 2018-11-12 DIAGNOSIS — R0602 Shortness of breath: Secondary | ICD-10-CM | POA: Diagnosis not present

## 2018-11-12 DIAGNOSIS — E782 Mixed hyperlipidemia: Secondary | ICD-10-CM | POA: Diagnosis not present

## 2018-11-20 DIAGNOSIS — R0602 Shortness of breath: Secondary | ICD-10-CM | POA: Diagnosis not present

## 2018-11-20 DIAGNOSIS — I2510 Atherosclerotic heart disease of native coronary artery without angina pectoris: Secondary | ICD-10-CM | POA: Diagnosis not present

## 2018-11-20 DIAGNOSIS — I10 Essential (primary) hypertension: Secondary | ICD-10-CM | POA: Diagnosis not present

## 2018-11-27 DIAGNOSIS — I10 Essential (primary) hypertension: Secondary | ICD-10-CM | POA: Diagnosis not present

## 2018-11-27 DIAGNOSIS — J449 Chronic obstructive pulmonary disease, unspecified: Secondary | ICD-10-CM | POA: Diagnosis not present

## 2018-12-16 DIAGNOSIS — K602 Anal fissure, unspecified: Secondary | ICD-10-CM | POA: Diagnosis not present

## 2018-12-16 DIAGNOSIS — K210 Gastro-esophageal reflux disease with esophagitis: Secondary | ICD-10-CM | POA: Diagnosis not present

## 2018-12-16 DIAGNOSIS — I10 Essential (primary) hypertension: Secondary | ICD-10-CM | POA: Diagnosis not present

## 2018-12-18 DIAGNOSIS — E782 Mixed hyperlipidemia: Secondary | ICD-10-CM | POA: Diagnosis not present

## 2018-12-18 DIAGNOSIS — I10 Essential (primary) hypertension: Secondary | ICD-10-CM | POA: Diagnosis not present

## 2018-12-18 DIAGNOSIS — I70219 Atherosclerosis of native arteries of extremities with intermittent claudication, unspecified extremity: Secondary | ICD-10-CM | POA: Diagnosis not present

## 2019-01-01 ENCOUNTER — Ambulatory Visit: Payer: Medicare Other | Admitting: Internal Medicine

## 2019-01-01 ENCOUNTER — Other Ambulatory Visit: Payer: Medicare Other

## 2019-01-15 DIAGNOSIS — B070 Plantar wart: Secondary | ICD-10-CM | POA: Diagnosis not present

## 2019-01-17 DIAGNOSIS — M7741 Metatarsalgia, right foot: Secondary | ICD-10-CM | POA: Diagnosis not present

## 2019-01-17 DIAGNOSIS — D2371 Other benign neoplasm of skin of right lower limb, including hip: Secondary | ICD-10-CM | POA: Diagnosis not present

## 2019-02-20 ENCOUNTER — Other Ambulatory Visit: Payer: Self-pay | Admitting: Nurse Practitioner

## 2019-02-20 DIAGNOSIS — K760 Fatty (change of) liver, not elsewhere classified: Secondary | ICD-10-CM

## 2019-02-27 ENCOUNTER — Inpatient Hospital Stay: Admission: RE | Admit: 2019-02-27 | Payer: 59 | Source: Ambulatory Visit

## 2019-03-11 ENCOUNTER — Telehealth: Payer: Self-pay | Admitting: *Deleted

## 2019-03-11 NOTE — Telephone Encounter (Signed)
Patient has been notified that lung cancer screening CT scan is due currently or will be in near future. Confirmed that patient is within the appropriate age range, and asymptomatic, (no signs or symptoms of lung cancer). Patient denies illness that would prevent curative treatment for lung cancer if found. Verified smoking history (former smoker, quit 07/2015). Patient is agreeable for CT scan being scheduled. She would prefer to be contacted on her cell # [phone].

## 2019-03-12 ENCOUNTER — Other Ambulatory Visit: Payer: Self-pay | Admitting: *Deleted

## 2019-03-12 DIAGNOSIS — Z87891 Personal history of nicotine dependence: Secondary | ICD-10-CM

## 2019-03-12 DIAGNOSIS — Z122 Encounter for screening for malignant neoplasm of respiratory organs: Secondary | ICD-10-CM

## 2019-03-14 ENCOUNTER — Other Ambulatory Visit: Payer: Self-pay

## 2019-03-14 ENCOUNTER — Ambulatory Visit
Admission: RE | Admit: 2019-03-14 | Discharge: 2019-03-14 | Disposition: A | Discharge status: Home / Self Care | Payer: 59 | Source: Ambulatory Visit | Attending: Oncology | Admitting: Oncology

## 2019-03-14 DIAGNOSIS — Z87891 Personal history of nicotine dependence: Secondary | ICD-10-CM

## 2019-03-14 DIAGNOSIS — Z122 Encounter for screening for malignant neoplasm of respiratory organs: Secondary | ICD-10-CM | POA: Insufficient documentation

## 2019-03-17 ENCOUNTER — Telehealth: Payer: Self-pay | Admitting: *Deleted

## 2019-03-17 NOTE — Telephone Encounter (Signed)
Notified patient of LDCT lung cancer screening program results with recommendation for 2 month follow up imaging. Also notified of incidental findings noted below and is encouraged to discuss further with PCP who will receive a copy of this note and/or the CT report. Patient verbalizes understanding.   IMPRESSION: 1. Lung-RADS 2, benign appearance or behavior. Continue annual screening with low-dose chest CT without contrast in 12 months. 2. Mild diffuse hepatic steatosis.  Aortic Atherosclerosis (ICD10-I70.0) and Emphysema (ICD10-J43.9

## 2019-03-20 ENCOUNTER — Other Ambulatory Visit: Payer: Self-pay | Admitting: Family Medicine

## 2019-03-20 DIAGNOSIS — Z1231 Encounter for screening mammogram for malignant neoplasm of breast: Secondary | ICD-10-CM

## 2019-05-02 ENCOUNTER — Ambulatory Visit
Admission: RE | Admit: 2019-05-02 | Discharge: 2019-05-02 | Disposition: A | Discharge status: Home / Self Care | Payer: 59 | Source: Ambulatory Visit | Attending: Family Medicine | Admitting: Family Medicine

## 2019-05-02 DIAGNOSIS — Z1231 Encounter for screening mammogram for malignant neoplasm of breast: Secondary | ICD-10-CM

## 2019-05-19 ENCOUNTER — Other Ambulatory Visit: Payer: Self-pay | Admitting: Nurse Practitioner

## 2019-05-19 DIAGNOSIS — K760 Fatty (change of) liver, not elsewhere classified: Secondary | ICD-10-CM

## 2019-05-27 ENCOUNTER — Ambulatory Visit
Admission: RE | Admit: 2019-05-27 | Discharge: 2019-05-27 | Disposition: A | Discharge status: Home / Self Care | Payer: 59 | Source: Ambulatory Visit | Attending: Nurse Practitioner | Admitting: Nurse Practitioner

## 2019-05-27 ENCOUNTER — Other Ambulatory Visit: Payer: Self-pay

## 2019-05-27 DIAGNOSIS — K760 Fatty (change of) liver, not elsewhere classified: Secondary | ICD-10-CM | POA: Insufficient documentation

## 2019-07-03 ENCOUNTER — Other Ambulatory Visit: Payer: Self-pay

## 2019-07-03 ENCOUNTER — Encounter: Payer: Self-pay | Admitting: *Deleted

## 2019-07-03 ENCOUNTER — Inpatient Hospital Stay: Payer: 59 | Attending: Internal Medicine

## 2019-07-03 ENCOUNTER — Inpatient Hospital Stay (HOSPITAL_BASED_OUTPATIENT_CLINIC_OR_DEPARTMENT_OTHER): Payer: 59 | Admitting: Internal Medicine

## 2019-07-03 DIAGNOSIS — C774 Secondary and unspecified malignant neoplasm of inguinal and lower limb lymph nodes: Secondary | ICD-10-CM

## 2019-07-03 NOTE — Progress Notes (Signed)
I connected with [name] on 07/03/2019 at 11:00 AM EST by video enabled telemedicine visit and verified that I am speaking with the correct person using two identifiers.  I discussed the limitations, risks, security and privacy concerns of performing an evaluation and management service by telemedicine and the availability of in-person appointments. I also discussed with the patient that there may be a patient responsible charge related to this service. The patient expressed understanding and agreed to proceed.    Other persons participating in the visit and their role in the encounter: RN/medical reconciliation Patient's location: Office Provider's location: Home  Oncology History Overview Note  # squamous carcinoma noted in a R inguinal node, presumed cervical cancer s/p  Chemotherapy and radiation   Secondary malignant neoplasm of lymph nodes of inguinal region or lower extremity ([place])   Initial Diagnosis   Secondary malignant neoplasm of lymph nodes of inguinal region or lower extremity ([place])      Chief Complaint: Squamous cell cancer follow-up   History of present illness:Kristen Simon 68 y.o.  female with history of history of squamous cell carcinoma of the right inguinal region unclear primary is here for follow-up.  Patient denies any new lumps or bumps.  Appetite is good but no weight loss no nausea vomiting.   Patient has chronic dizzy spells/vertigo for which she is on meclizine.  Stable.  Not any worse.  Patient also had a history of blood in stools for which her colonoscopy current resolved.  Observation/objective:  Assessment and plan: Secondary malignant neoplasm of lymph nodes of inguinal region or lower extremity ([place]) #Right inguinal squamous cell carcinoma question subcu primary status post chemoradiation; May/June 2018-CT scan PET scan negative for any uptake or residual adenopathy. STABLE.   #Chronic right inguinal discomfort/abdominal  pain-less likely malignant.  # BPPV- stable on meclizine prn.   #Intermittent blood in stools/question hemorrhoida- s/p Colonoscopy- resolved. .   # DISPOSITION: #  Follow-up in 12 months-MD;labs-cbc/cmp/LDH- Dr.B  Follow-up instructions:  I discussed the assessment and treatment plan with the patient.  The patient was provided an opportunity to ask questions and all were answered.  The patient agreed with the plan and demonstrated understanding of instructions.  The patient was advised to call back or seek an in person evaluation if the symptoms worsen or if the condition fails to improve as anticipated.  Dr. Charlaine Dalton [place] at [facility] 07/06/2019 6:22 PM

## 2019-07-03 NOTE — Assessment & Plan Note (Addendum)
#  Right inguinal squamous cell carcinoma question subcu primary status post chemoradiation; May/June 2018-CT scan PET scan negative for any uptake or residual adenopathy. STABLE.   #Chronic right inguinal discomfort/abdominal pain-less likely malignant.  # BPPV- stable on meclizine prn.   #Intermittent blood in stools/question hemorrhoida- s/p Colonoscopy- resolved. .   # DISPOSITION: #  Follow-up in 12 months-MD;labs-cbc/cmp/LDH- Dr.B

## 2020-03-08 ENCOUNTER — Telehealth: Payer: Self-pay

## 2020-03-08 NOTE — Telephone Encounter (Signed)
Paient notified that it is time to schedule the low dose lung cancer screening CT scan.  She currently is waiting for her insurance to go in effect and will call [facility] Estelle at [phone] when she receives her insurance card.  Expecting new insurance card to arrive 03/2020.

## 2020-04-01 ENCOUNTER — Other Ambulatory Visit: Payer: Self-pay | Admitting: Neurology

## 2020-04-01 DIAGNOSIS — G43019 Migraine without aura, intractable, without status migrainosus: Secondary | ICD-10-CM

## 2020-04-06 ENCOUNTER — Telehealth: Payer: Self-pay | Admitting: *Deleted

## 2020-04-06 NOTE — Telephone Encounter (Signed)
(  04/06/2020) Pt notified that lung cancer screening imaging is due currently or in the near future. Verified smoking history (Former Smoker since 2016, 0.88 ppd). New insurance; [employer], (ID#: [id number]) & Medicare, (ID#: [id number]) Tentative appt on 05/06/20 @ 10:15 SRW

## 2020-04-07 ENCOUNTER — Other Ambulatory Visit: Payer: Self-pay | Admitting: *Deleted

## 2020-04-07 DIAGNOSIS — Z122 Encounter for screening for malignant neoplasm of respiratory organs: Secondary | ICD-10-CM

## 2020-04-07 DIAGNOSIS — Z87891 Personal history of nicotine dependence: Secondary | ICD-10-CM

## 2020-04-20 ENCOUNTER — Other Ambulatory Visit: Payer: Self-pay

## 2020-04-20 ENCOUNTER — Ambulatory Visit
Admission: RE | Admit: 2020-04-20 | Discharge: 2020-04-20 | Disposition: A | Discharge status: Home / Self Care | Payer: Medicare HMO | Source: Ambulatory Visit | Attending: Neurology | Admitting: Neurology

## 2020-04-20 DIAGNOSIS — G43019 Migraine without aura, intractable, without status migrainosus: Secondary | ICD-10-CM | POA: Diagnosis not present

## 2020-04-20 MED ORDER — GADOBUTROL 1 MMOL/ML IV SOLN
7.0000 mL | Freq: Once | INTRAVENOUS | Status: AC | PRN
  Administered 2020-04-20: 7 mL via INTRAVENOUS

## 2020-05-06 ENCOUNTER — Ambulatory Visit
Admission: RE | Admit: 2020-05-06 | Discharge: 2020-05-06 | Disposition: A | Discharge status: Home / Self Care | Payer: Medicare HMO | Source: Ambulatory Visit | Attending: Nurse Practitioner | Admitting: Nurse Practitioner

## 2020-05-06 ENCOUNTER — Other Ambulatory Visit: Payer: Self-pay

## 2020-05-06 ENCOUNTER — Ambulatory Visit: Admission: RE | Admit: 2020-05-06 | Payer: Medicare HMO | Source: Ambulatory Visit

## 2020-05-06 DIAGNOSIS — Z122 Encounter for screening for malignant neoplasm of respiratory organs: Secondary | ICD-10-CM | POA: Insufficient documentation

## 2020-05-06 DIAGNOSIS — Z87891 Personal history of nicotine dependence: Secondary | ICD-10-CM | POA: Insufficient documentation

## 2020-05-12 ENCOUNTER — Encounter: Payer: Self-pay | Admitting: *Deleted

## 2020-05-24 ENCOUNTER — Other Ambulatory Visit: Payer: Self-pay | Admitting: Obstetrics & Gynecology

## 2020-05-24 DIAGNOSIS — R102 Pelvic and perineal pain unspecified side: Secondary | ICD-10-CM

## 2020-05-24 DIAGNOSIS — Z923 Personal history of irradiation: Secondary | ICD-10-CM

## 2020-05-24 DIAGNOSIS — Z01411 Encounter for gynecological examination (general) (routine) with abnormal findings: Secondary | ICD-10-CM

## 2020-05-26 ENCOUNTER — Other Ambulatory Visit: Payer: Self-pay | Admitting: Obstetrics & Gynecology

## 2020-06-02 ENCOUNTER — Ambulatory Visit
Admission: RE | Admit: 2020-06-02 | Discharge: 2020-06-02 | Disposition: A | Discharge status: Home / Self Care | Payer: Medicare HMO | Source: Ambulatory Visit | Attending: Obstetrics & Gynecology | Admitting: Obstetrics & Gynecology

## 2020-06-02 ENCOUNTER — Other Ambulatory Visit: Payer: Self-pay

## 2020-06-02 DIAGNOSIS — Z01411 Encounter for gynecological examination (general) (routine) with abnormal findings: Secondary | ICD-10-CM | POA: Diagnosis present

## 2020-06-02 DIAGNOSIS — Z923 Personal history of irradiation: Secondary | ICD-10-CM | POA: Diagnosis present

## 2020-06-02 DIAGNOSIS — R102 Pelvic and perineal pain: Secondary | ICD-10-CM | POA: Insufficient documentation

## 2020-06-02 LAB — POCT I-STAT CREATININE: Creatinine, Ser: 1.1 mg/dL — ABNORMAL HIGH (ref 0.44–1.00)

## 2020-06-02 MED ORDER — IOHEXOL 300 MG/ML  SOLN
100.0000 mL | Freq: Once | INTRAMUSCULAR | Status: AC | PRN
  Administered 2020-06-02: 100 mL via INTRAVENOUS

## 2020-06-24 ENCOUNTER — Other Ambulatory Visit
Admission: RE | Admit: 2020-06-24 | Discharge: 2020-06-24 | Disposition: A | Discharge status: Home / Self Care | Payer: Medicare HMO | Source: Ambulatory Visit | Attending: Pulmonary Disease | Admitting: Pulmonary Disease

## 2020-06-24 DIAGNOSIS — R0602 Shortness of breath: Secondary | ICD-10-CM | POA: Diagnosis present

## 2020-06-24 DIAGNOSIS — J449 Chronic obstructive pulmonary disease, unspecified: Secondary | ICD-10-CM | POA: Diagnosis present

## 2020-06-24 LAB — FIBRIN DERIVATIVES D-DIMER (ARMC ONLY): Fibrin derivatives D-dimer (ARMC): 472.64 ng/mL (FEU) (ref 0.00–499.00)

## 2020-07-02 ENCOUNTER — Inpatient Hospital Stay: Payer: Medicare HMO | Attending: Internal Medicine

## 2020-07-02 ENCOUNTER — Other Ambulatory Visit: Payer: Self-pay

## 2020-07-02 ENCOUNTER — Inpatient Hospital Stay (HOSPITAL_BASED_OUTPATIENT_CLINIC_OR_DEPARTMENT_OTHER): Payer: Medicare HMO | Admitting: Internal Medicine

## 2020-07-02 ENCOUNTER — Other Ambulatory Visit: Payer: Self-pay | Admitting: Family Medicine

## 2020-07-02 ENCOUNTER — Encounter: Payer: Self-pay | Admitting: Internal Medicine

## 2020-07-02 ENCOUNTER — Telehealth: Payer: Self-pay | Admitting: *Deleted

## 2020-07-02 VITALS — BP 168/66 | HR 60 | Temp 97.2°F | Resp 18 | Ht 62.0 in | Wt 169.0 lb

## 2020-07-02 DIAGNOSIS — C774 Secondary and unspecified malignant neoplasm of inguinal and lower limb lymph nodes: Secondary | ICD-10-CM

## 2020-07-02 DIAGNOSIS — R5383 Other fatigue: Secondary | ICD-10-CM | POA: Insufficient documentation

## 2020-07-02 DIAGNOSIS — J449 Chronic obstructive pulmonary disease, unspecified: Secondary | ICD-10-CM | POA: Diagnosis not present

## 2020-07-02 DIAGNOSIS — Z8572 Personal history of non-Hodgkin lymphomas: Secondary | ICD-10-CM | POA: Diagnosis not present

## 2020-07-02 DIAGNOSIS — Z9221 Personal history of antineoplastic chemotherapy: Secondary | ICD-10-CM | POA: Diagnosis not present

## 2020-07-02 DIAGNOSIS — K760 Fatty (change of) liver, not elsewhere classified: Secondary | ICD-10-CM | POA: Diagnosis not present

## 2020-07-02 DIAGNOSIS — I10 Essential (primary) hypertension: Secondary | ICD-10-CM | POA: Diagnosis not present

## 2020-07-02 DIAGNOSIS — C801 Malignant (primary) neoplasm, unspecified: Secondary | ICD-10-CM | POA: Diagnosis not present

## 2020-07-02 DIAGNOSIS — E875 Hyperkalemia: Secondary | ICD-10-CM | POA: Diagnosis not present

## 2020-07-02 DIAGNOSIS — R5381 Other malaise: Secondary | ICD-10-CM | POA: Insufficient documentation

## 2020-07-02 DIAGNOSIS — Z923 Personal history of irradiation: Secondary | ICD-10-CM | POA: Insufficient documentation

## 2020-07-02 DIAGNOSIS — Z79899 Other long term (current) drug therapy: Secondary | ICD-10-CM | POA: Insufficient documentation

## 2020-07-02 DIAGNOSIS — Z8541 Personal history of malignant neoplasm of cervix uteri: Secondary | ICD-10-CM | POA: Diagnosis not present

## 2020-07-02 DIAGNOSIS — Z87891 Personal history of nicotine dependence: Secondary | ICD-10-CM | POA: Insufficient documentation

## 2020-07-02 DIAGNOSIS — Z7982 Long term (current) use of aspirin: Secondary | ICD-10-CM | POA: Diagnosis not present

## 2020-07-02 DIAGNOSIS — E7800 Pure hypercholesterolemia, unspecified: Secondary | ICD-10-CM | POA: Diagnosis not present

## 2020-07-02 DIAGNOSIS — Z1231 Encounter for screening mammogram for malignant neoplasm of breast: Secondary | ICD-10-CM

## 2020-07-02 LAB — LACTATE DEHYDROGENASE: LDH: 126 U/L (ref 98–192)

## 2020-07-02 NOTE — Telephone Encounter (Signed)
-----   Message from Secundino Ginger sent at 07/02/2020 12:05 PM EDT ----- Regarding: wrong pharmacy Patient called and said she just left the [place] and the wrong pharmacy was on her paper. [facility] of [facility] needs [facility]

## 2020-07-02 NOTE — Progress Notes (Signed)
[facility] OFFICE PROGRESS NOTE  Patient Care Team: [place] Pink, MD as PCP - General (Family Medicine) Clent Jacks, RN as Registered Nurse  Cancer Staging No matching staging information was found for the patient.   Oncology History Overview Note  # squamous carcinoma noted in a R inguinal node, presumed cervical cancer s/p  Chemotherapy and radiation   Secondary malignant neoplasm of lymph nodes of inguinal region or lower extremity ([place])   Initial Diagnosis   Secondary malignant neoplasm of lymph nodes of inguinal region or lower extremity ([place])       INTERVAL HISTORY:  Kristen Simon 69 y.o.  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  Patient denies any vaginal bleeding.  Has any blood in stools or black or stools.  No nausea no vomiting but no headach    Review of Systems  Constitutional: Positive for malaise/fatigue. Negative for chills, diaphoresis, fever and weight loss.  HENT: Negative for nosebleeds and sore throat.   Eyes: Negative for double vision.  Respiratory: Negative for cough, hemoptysis, sputum production, shortness of breath and wheezing.   Cardiovascular: Negative for chest pain, palpitations, orthopnea and leg swelling.  Gastrointestinal: Negative for abdominal pain, blood in stool, constipation, diarrhea, heartburn, melena, nausea and vomiting.  Genitourinary: Negative for dysuria, frequency and urgency.  Musculoskeletal: Positive for back pain and joint pain.  Skin: Negative.  Negative for itching and rash.  Neurological: Negative for dizziness, tingling, focal weakness, weakness and headaches.  Endo/Heme/Allergies: Does not bruise/bleed easily.  Psychiatric/Behavioral: Negative for depression. The patient is not nervous/anxious and does not have insomnia.      PAST MEDICAL HISTORY :  Past Medical History:  Diagnosis  Date  . Allergy   . Anemia   . Breast screening, unspecified   . COPD (chronic obstructive pulmonary disease) ([place])   . Dysphagia   . Elevated cholesterol   . Fatty liver   . GERD (gastroesophageal reflux disease)   . Groin mass   . History of cancer chemotherapy 2013  . History of rectal bleeding   . Hx of radiation therapy 2013  . Hypertension 2010  . Internal hemorrhoids with other complication 0923  . Lymphoma ([place])   . Personal history of chemotherapy   . Personal history of radiation therapy   . Personal history of tobacco use, presenting hazards to health   . 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    lymphoma 2013  . Ulcer 1970   stomach-resolved  . Unilateral or unspecified femoral hernia with obstruction     PAST SURGICAL HISTORY :   Past Surgical History:  Procedure Laterality Date  . ANOSCOPY  03/27/2012   anal biopsy/hemorrhoid-benign  . APPENDECTOMY  1970  . COLONOSCOPY WITH PROPOFOL N/A 09/30/2018   Procedure: COLONOSCOPY WITH PROPOFOL;  Surgeon: Manya Silvas, MD;  Location: [facility] ENDOSCOPY;  Service: Endoscopy;  Laterality: N/A;  . ESOPHAGOGASTRODUODENOSCOPY N/A 09/30/2018   Procedure: ESOPHAGOGASTRODUODENOSCOPY (EGD);  Surgeon: Manya Silvas, MD;  Location: [facility] ENDOSCOPY;  Service: Endoscopy;  Laterality: N/A;  . FRACTURE SURGERY  3007,6226   right wrist,right leg  . GROIN MASS OPEN BIOPSY Right 01/24/2012   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  . HERNIA REPAIR Right 01/10/2012   right femerol hernia  . PATELLA FRACTURE SURGERY Right 1982  . WRIST SURGERY Right  FAMILY HISTORY :   Family History  Problem Relation Age of Onset  . Cancer Mother        lung  . Breast cancer Maternal Aunt     SOCIAL HISTORY:   Social History   Tobacco Use  . Smoking status: Former Smoker    Packs/day: 0.88    Years: 40.00    Pack years: 35.20    Quit date:  08/01/2015    Years since quitting: 4.9  . Smokeless tobacco: Never Used  Vaping Use  . Vaping Use: Never used  Substance Use Topics  . Alcohol use: Yes  . Drug use: No    ALLERGIES:  is allergic to amlodipine, citalopram, gabapentin, pregabalin, statins, and tiotropium.  MEDICATIONS:  Current Outpatient Medications  Medication Sig Dispense Refill  . Acetaminophen (TYLENOL ARTHRITIS PAIN PO) Take 650 mg 3 (three) times daily by mouth.    Marland Kitchen albuterol (PROVENTIL HFA;VENTOLIN HFA) 108 (90 BASE) MCG/ACT inhaler Inhale into the lungs every 6 (six) hours as needed for wheezing or shortness of breath.    Marland Kitchen aspirin 81 MG tablet Take 81 mg by mouth daily.    . budesonide-formoterol (SYMBICORT) 80-4.5 MCG/ACT inhaler Inhale 2 puffs into the lungs 2 (two) times daily.    . Calcium Carbonate-Vit D-Min (CALTRATE 600+D PLUS MINERALS) 600-800 MG-UNIT CHEW Chew 1 tablet 3 (three) times a week by mouth.    . carvedilol (COREG) 25 MG tablet Take 25 mg by mouth 2 (two) times daily.    . Cholecalciferol 125 MCG (5000 UT) capsule Take by mouth.    . cloNIDine (CATAPRES) 0.1 MG tablet Take 1 tablet (0.1 mg total) by mouth daily as needed (blood pressure > 180/110). 20 tablet 0  . fluticasone (FLONASE) 50 MCG/ACT nasal spray Place 2 sprays into the nose daily.    Marland Kitchen guaiFENesin (MUCINEX) 600 MG 12 hr tablet Take by mouth.    . Lactobacillus ([employer] PROBIOTIC ACIDOPHILUS) 10 MG CAPS Take 1 tablet by mouth daily.    Marland Kitchen loratadine (CLARITIN REDITABS) 10 MG dissolvable tablet Take 10 mg by mouth daily.    . meclizine (ANTIVERT) 25 MG tablet Take by mouth.    . montelukast (SINGULAIR) 10 MG tablet Take 10 mg by mouth at bedtime.    . Omega-3 Fatty Acids (RA FISH OIL) 1000 MG CAPS Take 1 capsule by mouth daily.    Marland Kitchen senna-docusate (SENOKOT-S) 8.6-50 MG tablet Take 1 tablet by mouth at bedtime as needed. 30 tablet 6  . spironolactone (ALDACTONE) 25 MG tablet Take 25 mg by mouth daily.    Marland Kitchen telmisartan (MICARDIS) 80 MG  tablet Take by mouth.    . vitamin E 180 MG (400 UNITS) capsule Take by mouth.     No current facility-administered medications for this visit.    PHYSICAL EXAMINATION: ECOG PERFORMANCE STATUS: 1 - Symptomatic but completely ambulatory  BP (!) 168/66 (BP Location: Left Arm, Patient Position: Sitting, Cuff Size: Normal)   Pulse 60   Temp (!) 97.2 F (36.2 C) (Tympanic)   Resp 18   Ht 5\' 2"  (1.575 m)   Wt 169 lb (76.7 kg)   SpO2 99%   BMI 30.91 kg/m   Filed Weights   07/02/20 1009  Weight: 169 lb (76.7 kg)   Physical Exam HENT:     Head: Normocephalic and atraumatic.     Mouth/Throat:     Pharynx: No oropharyngeal exudate.  Eyes:     Pupils: Pupils are equal, round, and reactive to  light.  Cardiovascular:     Rate and Rhythm: Normal rate and regular rhythm.  Pulmonary:     Effort: Pulmonary effort is normal. No respiratory distress.     Breath sounds: Normal breath sounds. No wheezing.  Abdominal:     General: Bowel sounds are normal. There is no distension.     Palpations: Abdomen is soft. There is no mass.     Tenderness: There is no abdominal tenderness. There is no guarding or rebound.  Musculoskeletal:        General: No tenderness. Normal range of motion.     Cervical back: Normal range of motion and neck supple.  Skin:    General: Skin is warm.  Neurological:     Mental Status: She is alert and oriented to person, place, and time.  Psychiatric:        Mood and Affect: Affect normal.     LABORATORY DATA:  I have reviewed the data as listed    Component Value Date/Time   NA 140 11/10/2018 1014   NA 140 03/19/2017 1131   NA 137 12/09/2014 1114   K 3.9 11/10/2018 1014   K 3.7 12/09/2014 1114   CL 106 11/10/2018 1014   CL 103 12/09/2014 1114   CO2 25 11/10/2018 1014   CO2 27 12/09/2014 1114   GLUCOSE 107 (H) 11/10/2018 1014   GLUCOSE 130 (H) 12/09/2014 1114   BUN 13 11/10/2018 1014   BUN 9 03/19/2017 1131   BUN 10 12/09/2014 1114   CREATININE 1.10  (H) 06/02/2020 1335   CREATININE 0.62 12/09/2014 1114   CALCIUM 9.7 11/10/2018 1014   CALCIUM 9.3 12/09/2014 1114   PROT 7.3 11/10/2018 1014   PROT 7.3 03/19/2017 1131   PROT 7.2 12/09/2014 1114   ALBUMIN 4.4 11/10/2018 1014   ALBUMIN 4.9 (H) 03/19/2017 1131   ALBUMIN 4.4 12/09/2014 1114   AST 29 11/10/2018 1014   AST 31 12/09/2014 1114   ALT 31 11/10/2018 1014   ALT 35 12/09/2014 1114   ALKPHOS 45 11/10/2018 1014   ALKPHOS 47 12/09/2014 1114   BILITOT 0.3 11/10/2018 1014   BILITOT 0.3 03/19/2017 1131   BILITOT 0.6 12/09/2014 1114   GFRNONAA >60 11/10/2018 1014   GFRNONAA >60 12/09/2014 1114   GFRAA >60 11/10/2018 1014   GFRAA >60 12/09/2014 1114    No results found for: SPEP, UPEP  Lab Results  Component Value Date   WBC 4.1 11/10/2018   NEUTROABS 2.4 12/31/2017   HGB 13.5 11/10/2018   HCT 39.2 11/10/2018   MCV 88.1 11/10/2018   PLT 289 11/10/2018      Chemistry      Component Value Date/Time   NA 140 11/10/2018 1014   NA 140 03/19/2017 1131   NA 137 12/09/2014 1114   K 3.9 11/10/2018 1014   K 3.7 12/09/2014 1114   CL 106 11/10/2018 1014   CL 103 12/09/2014 1114   CO2 25 11/10/2018 1014   CO2 27 12/09/2014 1114   BUN 13 11/10/2018 1014   BUN 9 03/19/2017 1131   BUN 10 12/09/2014 1114   CREATININE 1.10 (H) 06/02/2020 1335   CREATININE 0.62 12/09/2014 1114      Component Value Date/Time   CALCIUM 9.7 11/10/2018 1014   CALCIUM 9.3 12/09/2014 1114   ALKPHOS 45 11/10/2018 1014   ALKPHOS 47 12/09/2014 1114   AST 29 11/10/2018 1014   AST 31 12/09/2014 1114   ALT 31 11/10/2018 1014   ALT 35 12/09/2014  1114   BILITOT 0.3 11/10/2018 1014   BILITOT 0.3 03/19/2017 1131   BILITOT 0.6 12/09/2014 1114       RADIOGRAPHIC STUDIES: I have personally reviewed the radiological images as listed and agreed with the findings in the report. No results found.   ASSESSMENT & PLAN:  Secondary malignant neoplasm of lymph nodes of inguinal region or lower extremity  ([place]) #Right inguinal squamous cell carcinoma question subcu primary status post chemoradiation; May/June 2018-CT scan PET scan negative for any uptake or residual adenopathy. STABLE.    #Chronic right inguinal discomfort/abdominal pain-less likely malignant.  # mild hyperkalemia-5.3. on spirinolactone; recommend speaking to PCP re: any alternatives.   # HTN- 160s/90- at home 372 systolic. Monitor for now.   # BPPV- STABLE [Dr.Shah;Neurology] on meclizine/Baclofen prn.   # DISPOSITION: #  Follow-up in 12 months-MD;labs-cbc/cmp/LDH- Dr.B   Orders Placed This Encounter  Procedures  . CBC with Differential    Standing Status:   Future    Standing Expiration Date:   12/30/2021  . Comprehensive metabolic panel    Standing Status:   Future    Standing Expiration Date:   12/30/2021  . Lactate dehydrogenase    Standing Status:   Future    Standing Expiration Date:   12/30/2021   All questions were answered. The patient knows to call the clinic with any problems, questions or concerns.      Cammie Sickle, MD 07/02/2020 2:03 PM

## 2020-07-02 NOTE — Patient Instructions (Signed)
#   SPIRINOLACTONE can make you Potassium go high; please check with your prescribing doctor if any changes are advised.

## 2020-07-02 NOTE — Assessment & Plan Note (Signed)
#  Right inguinal squamous cell carcinoma question subcu primary status post chemoradiation; May/June 2018-CT scan PET scan negative for any uptake or residual adenopathy. STABLE.    #Chronic right inguinal discomfort/abdominal pain-less likely malignant.  # mild hyperkalemia-5.3. on spirinolactone; recommend speaking to PCP re: any alternatives.   # HTN- 160s/90- at home 341 systolic. Monitor for now.   # BPPV- STABLE [Dr.Shah;Neurology] on meclizine/Baclofen prn.   # DISPOSITION: #  Follow-up in 12 months-MD;labs-cbc/cmp/LDH- Dr.B

## 2020-07-02 NOTE — Telephone Encounter (Signed)
Pharmacy updated per patient's request

## 2020-07-21 ENCOUNTER — Inpatient Hospital Stay
Admission: EM | Admit: 2020-07-21 | Discharge: 2020-07-25 | DRG: 915 | Disposition: A | Discharge status: Home / Self Care | Payer: Medicare HMO | Attending: Internal Medicine | Admitting: Internal Medicine

## 2020-07-21 ENCOUNTER — Other Ambulatory Visit: Payer: Self-pay

## 2020-07-21 ENCOUNTER — Emergency Department: Payer: Medicare HMO

## 2020-07-21 DIAGNOSIS — T50995A Adverse effect of other drugs, medicaments and biological substances, initial encounter: Secondary | ICD-10-CM | POA: Diagnosis present

## 2020-07-21 DIAGNOSIS — R062 Wheezing: Secondary | ICD-10-CM | POA: Diagnosis present

## 2020-07-21 DIAGNOSIS — U071 COVID-19: Secondary | ICD-10-CM | POA: Diagnosis present

## 2020-07-21 DIAGNOSIS — Z923 Personal history of irradiation: Secondary | ICD-10-CM

## 2020-07-21 DIAGNOSIS — T886XXA Anaphylactic reaction due to adverse effect of correct drug or medicament properly administered, initial encounter: Secondary | ICD-10-CM | POA: Diagnosis present

## 2020-07-21 DIAGNOSIS — Z87891 Personal history of nicotine dependence: Secondary | ICD-10-CM

## 2020-07-21 DIAGNOSIS — Z7982 Long term (current) use of aspirin: Secondary | ICD-10-CM

## 2020-07-21 DIAGNOSIS — I10 Essential (primary) hypertension: Secondary | ICD-10-CM | POA: Diagnosis present

## 2020-07-21 DIAGNOSIS — J9600 Acute respiratory failure, unspecified whether with hypoxia or hypercapnia: Secondary | ICD-10-CM | POA: Diagnosis present

## 2020-07-21 DIAGNOSIS — J9601 Acute respiratory failure with hypoxia: Secondary | ICD-10-CM | POA: Diagnosis present

## 2020-07-21 DIAGNOSIS — Z803 Family history of malignant neoplasm of breast: Secondary | ICD-10-CM

## 2020-07-21 DIAGNOSIS — J441 Chronic obstructive pulmonary disease with (acute) exacerbation: Secondary | ICD-10-CM | POA: Diagnosis present

## 2020-07-21 DIAGNOSIS — Z79899 Other long term (current) drug therapy: Secondary | ICD-10-CM | POA: Diagnosis not present

## 2020-07-21 DIAGNOSIS — E782 Mixed hyperlipidemia: Secondary | ICD-10-CM | POA: Diagnosis present

## 2020-07-21 DIAGNOSIS — Z87898 Personal history of other specified conditions: Secondary | ICD-10-CM

## 2020-07-21 DIAGNOSIS — Z9221 Personal history of antineoplastic chemotherapy: Secondary | ICD-10-CM | POA: Diagnosis not present

## 2020-07-21 DIAGNOSIS — Z8572 Personal history of non-Hodgkin lymphomas: Secondary | ICD-10-CM | POA: Diagnosis not present

## 2020-07-21 DIAGNOSIS — I959 Hypotension, unspecified: Secondary | ICD-10-CM

## 2020-07-21 DIAGNOSIS — Z7951 Long term (current) use of inhaled steroids: Secondary | ICD-10-CM | POA: Diagnosis not present

## 2020-07-21 DIAGNOSIS — J1282 Pneumonia due to coronavirus disease 2019: Secondary | ICD-10-CM | POA: Diagnosis present

## 2020-07-21 DIAGNOSIS — E7800 Pure hypercholesterolemia, unspecified: Secondary | ICD-10-CM | POA: Diagnosis present

## 2020-07-21 DIAGNOSIS — Z888 Allergy status to other drugs, medicaments and biological substances status: Secondary | ICD-10-CM

## 2020-07-21 DIAGNOSIS — K219 Gastro-esophageal reflux disease without esophagitis: Secondary | ICD-10-CM | POA: Diagnosis present

## 2020-07-21 DIAGNOSIS — J449 Chronic obstructive pulmonary disease, unspecified: Secondary | ICD-10-CM | POA: Diagnosis not present

## 2020-07-21 DIAGNOSIS — J440 Chronic obstructive pulmonary disease with acute lower respiratory infection: Secondary | ICD-10-CM | POA: Diagnosis present

## 2020-07-21 DIAGNOSIS — T782XXA Anaphylactic shock, unspecified, initial encounter: Secondary | ICD-10-CM | POA: Diagnosis present

## 2020-07-21 LAB — COMPREHENSIVE METABOLIC PANEL
ALT: 25 U/L (ref 0–44)
AST: 27 U/L (ref 15–41)
Albumin: 4.1 g/dL (ref 3.5–5.0)
Alkaline Phosphatase: 41 U/L (ref 38–126)
Anion gap: 11 (ref 5–15)
BUN: 16 mg/dL (ref 8–23)
CO2: 25 mmol/L (ref 22–32)
Calcium: 9.1 mg/dL (ref 8.9–10.3)
Chloride: 101 mmol/L (ref 98–111)
Creatinine, Ser: 0.92 mg/dL (ref 0.44–1.00)
GFR, Estimated: >60 mL/min (ref >60)
Glucose, Bld: 124 mg/dL — ABNORMAL HIGH (ref 70–99)
Potassium: 4 mmol/L (ref 3.5–5.1)
Sodium: 137 mmol/L (ref 135–145)
Total Bilirubin: 0.6 mg/dL (ref 0.3–1.2)
Total Protein: 7.3 g/dL (ref 6.5–8.1)

## 2020-07-21 LAB — BLOOD GAS, VENOUS
Acid-base deficit: 0.2 mmol/L (ref 0.0–2.0)
Bicarbonate: 25.9 mmol/L (ref 20.0–28.0)
O2 Saturation: 77 %
Patient temperature: 37
pCO2, Ven: 47 mmHg (ref 44.0–60.0)
pH, Ven: 7.35 (ref 7.250–7.430)
pO2, Ven: 44 mmHg (ref 32.0–45.0)

## 2020-07-21 LAB — BRAIN NATRIURETIC PEPTIDE: B Natriuretic Peptide: 25.4 pg/mL (ref 0.0–100.0)

## 2020-07-21 LAB — CBC WITH DIFFERENTIAL/PLATELET
Abs Immature Granulocytes: 0.05 10*3/uL (ref 0.00–0.07)
Basophils Absolute: 0 10*3/uL (ref 0.0–0.1)
Basophils Relative: 0 %
Eosinophils Absolute: 0 10*3/uL (ref 0.0–0.5)
Eosinophils Relative: 0 %
HCT: 38.4 % (ref 36.0–46.0)
Hemoglobin: 13.1 g/dL (ref 12.0–15.0)
Immature Granulocytes: 1 %
Lymphocytes Relative: 35 %
Lymphs Abs: 1.8 10*3/uL (ref 0.7–4.0)
MCH: 31 pg (ref 26.0–34.0)
MCHC: 34.1 g/dL (ref 30.0–36.0)
MCV: 90.8 fL (ref 80.0–100.0)
Monocytes Absolute: 0.4 10*3/uL (ref 0.1–1.0)
Monocytes Relative: 7 %
Neutro Abs: 2.9 10*3/uL (ref 1.7–7.7)
Neutrophils Relative %: 57 %
Platelets: 230 10*3/uL (ref 150–400)
RBC: 4.23 MIL/uL (ref 3.87–5.11)
RDW: 12.6 % (ref 11.5–15.5)
WBC: 5.2 10*3/uL (ref 4.0–10.5)
nRBC: 0 % (ref 0.0–0.2)

## 2020-07-21 LAB — TROPONIN I (HIGH SENSITIVITY)
Troponin I (High Sensitivity): 4 ng/L (ref <18)
Troponin I (High Sensitivity): 4 ng/L (ref <18)
Troponin I (High Sensitivity): 4 ng/L (ref <18)
Troponin I (High Sensitivity): 4 ng/L (ref <18)

## 2020-07-21 LAB — PROCALCITONIN
Procalcitonin: <0.1 ng/mL
Procalcitonin: <0.1 ng/mL

## 2020-07-21 LAB — FIBRINOGEN: Fibrinogen: 342 mg/dL (ref 210–475)

## 2020-07-21 LAB — FIBRIN DERIVATIVES D-DIMER (ARMC ONLY): Fibrin derivatives D-dimer (ARMC): 673.95 ng/mL (FEU) — ABNORMAL HIGH (ref 0.00–499.00)

## 2020-07-21 LAB — LACTATE DEHYDROGENASE: LDH: 161 U/L (ref 98–192)

## 2020-07-21 LAB — FERRITIN: Ferritin: 526 ng/mL — ABNORMAL HIGH (ref 11–307)

## 2020-07-21 LAB — MAGNESIUM: Magnesium: 1.5 mg/dL — ABNORMAL LOW (ref 1.7–2.4)

## 2020-07-21 MED ORDER — GUAIFENESIN ER 600 MG PO TB12: 600.0000 mg | ORAL_TABLET | Freq: Two times a day (BID) | ORAL | Status: DC

## 2020-07-21 MED ORDER — ONDANSETRON HCL 4 MG/2ML IJ SOLN: 4.0000 mg | Freq: Four times a day (QID) | INTRAMUSCULAR | Status: DC | PRN

## 2020-07-21 MED ORDER — IRBESARTAN 75 MG PO TABS
75.0000 mg | ORAL_TABLET | Freq: Every day | ORAL | Status: DC
  Administered 2020-07-22 – 2020-07-25 (×4): 75 mg via ORAL
  Filled 2020-07-21 (×4): qty 1

## 2020-07-21 MED ORDER — ZINC SULFATE 220 (50 ZN) MG PO CAPS
220.0000 mg | ORAL_CAPSULE | Freq: Every day | ORAL | Status: DC
  Administered 2020-07-21 – 2020-07-25 (×5): 220 mg via ORAL
  Filled 2020-07-21 (×5): qty 1

## 2020-07-21 MED ORDER — MAGNESIUM HYDROXIDE 400 MG/5ML PO SUSP: 30.0000 mL | Freq: Every day | ORAL | Status: DC | PRN

## 2020-07-21 MED ORDER — SODIUM CHLORIDE 0.9 % IV SOLN
100.0000 mg | Freq: Every day | INTRAVENOUS | Status: AC
  Administered 2020-07-22 – 2020-07-25 (×4): 100 mg via INTRAVENOUS
  Filled 2020-07-21: qty 100
  Filled 2020-07-21 (×2): qty 20
  Filled 2020-07-21: qty 100

## 2020-07-21 MED ORDER — CALTRATE 600+D PLUS MINERALS 600-800 MG-UNIT PO CHEW: 1.0000 | CHEWABLE_TABLET | ORAL | Status: DC

## 2020-07-21 MED ORDER — GUAIFENESIN-DM 100-10 MG/5ML PO SYRP
10.0000 mL | ORAL_SOLUTION | ORAL | Status: DC | PRN
  Administered 2020-07-24: 10 mL via ORAL
  Filled 2020-07-21: qty 10

## 2020-07-21 MED ORDER — IPRATROPIUM-ALBUTEROL 0.5-2.5 (3) MG/3ML IN SOLN
9.0000 mL | Freq: Once | RESPIRATORY_TRACT | Status: DC
  Filled 2020-07-21: qty 9

## 2020-07-21 MED ORDER — RA FISH OIL 1000 MG PO CAPS: 1.0000 | ORAL_CAPSULE | Freq: Every day | ORAL | Status: DC

## 2020-07-21 MED ORDER — SODIUM CHLORIDE 0.9 % IV SOLN
200.0000 mg | Freq: Once | INTRAVENOUS | Status: AC
  Administered 2020-07-22: 200 mg via INTRAVENOUS
  Filled 2020-07-21: qty 40

## 2020-07-21 MED ORDER — VITAMIN E 45 MG (100 UNIT) PO CAPS
400.0000 [IU] | ORAL_CAPSULE | Freq: Every day | ORAL | Status: DC
  Administered 2020-07-22 – 2020-07-25 (×4): 400 [IU] via ORAL
  Filled 2020-07-21 (×4): qty 4

## 2020-07-21 MED ORDER — RISAQUAD PO CAPS
1.0000 | ORAL_CAPSULE | Freq: Every day | ORAL | Status: DC
  Administered 2020-07-22 – 2020-07-25 (×4): 1 via ORAL
  Filled 2020-07-21 (×4): qty 1

## 2020-07-21 MED ORDER — MAGNESIUM SULFATE 2 GM/50ML IV SOLN
2.0000 g | Freq: Once | INTRAVENOUS | Status: AC
  Administered 2020-07-21: 2 g via INTRAVENOUS
  Filled 2020-07-21: qty 50

## 2020-07-21 MED ORDER — GUAIFENESIN ER 600 MG PO TB12
600.0000 mg | ORAL_TABLET | Freq: Two times a day (BID) | ORAL | Status: DC
  Administered 2020-07-21 – 2020-07-25 (×8): 600 mg via ORAL
  Filled 2020-07-21 (×8): qty 1

## 2020-07-21 MED ORDER — FAMOTIDINE IN NACL 20-0.9 MG/50ML-% IV SOLN
20.0000 mg | Freq: Once | INTRAVENOUS | Status: AC
  Administered 2020-07-21: 20 mg via INTRAVENOUS
  Filled 2020-07-21: qty 50

## 2020-07-21 MED ORDER — PREDNISONE 50 MG PO TABS
50.0000 mg | ORAL_TABLET | Freq: Every day | ORAL | Status: DC
  Administered 2020-07-25: 50 mg via ORAL
  Filled 2020-07-21: qty 1

## 2020-07-21 MED ORDER — ASPIRIN EC 81 MG PO TBEC
81.0000 mg | DELAYED_RELEASE_TABLET | Freq: Every day | ORAL | Status: DC
  Administered 2020-07-21 – 2020-07-25 (×5): 81 mg via ORAL
  Filled 2020-07-21 (×5): qty 1

## 2020-07-21 MED ORDER — SODIUM CHLORIDE 0.9 % IV SOLN: 100.0000 mg | Freq: Every day | INTRAVENOUS | Status: DC

## 2020-07-21 MED ORDER — CARVEDILOL 25 MG PO TABS
25.0000 mg | ORAL_TABLET | Freq: Two times a day (BID) | ORAL | Status: DC
  Administered 2020-07-21 – 2020-07-25 (×8): 25 mg via ORAL
  Filled 2020-07-21 (×8): qty 1

## 2020-07-21 MED ORDER — CVS PROBIOTIC ACIDOPHILUS 10 MG PO CAPS: 1.0000 | ORAL_CAPSULE | Freq: Every day | ORAL | Status: DC

## 2020-07-21 MED ORDER — DIPHENHYDRAMINE HCL 50 MG/ML IJ SOLN
25.0000 mg | Freq: Once | INTRAMUSCULAR | Status: AC
  Administered 2020-07-21: 25 mg via INTRAVENOUS
  Filled 2020-07-21: qty 1

## 2020-07-21 MED ORDER — MECLIZINE HCL 12.5 MG PO TABS
12.5000 mg | ORAL_TABLET | Freq: Three times a day (TID) | ORAL | Status: DC | PRN
  Filled 2020-07-21: qty 1

## 2020-07-21 MED ORDER — METHYLPREDNISOLONE SODIUM SUCC 125 MG IJ SOLR
80.0000 mg | Freq: Two times a day (BID) | INTRAMUSCULAR | Status: AC
  Administered 2020-07-22 – 2020-07-24 (×6): 80 mg via INTRAVENOUS
  Filled 2020-07-21 (×6): qty 2

## 2020-07-21 MED ORDER — CALCIUM CITRATE-VITAMIN D 500-500 MG-UNIT PO CHEW
1.0000 | CHEWABLE_TABLET | ORAL | Status: DC
  Administered 2020-07-23: 10:00:00 1 via ORAL
  Filled 2020-07-21: qty 1

## 2020-07-21 MED ORDER — DIPHENHYDRAMINE HCL 50 MG/ML IJ SOLN
25.0000 mg | Freq: Four times a day (QID) | INTRAMUSCULAR | Status: DC
  Administered 2020-07-21 – 2020-07-25 (×15): 25 mg via INTRAVENOUS
  Filled 2020-07-21 (×15): qty 1

## 2020-07-21 MED ORDER — OMEGA-3-ACID ETHYL ESTERS 1 G PO CAPS
1.0000 g | ORAL_CAPSULE | Freq: Every day | ORAL | Status: DC
  Administered 2020-07-22 – 2020-07-25 (×4): 1 g via ORAL
  Filled 2020-07-21 (×4): qty 1

## 2020-07-21 MED ORDER — CLONIDINE HCL 0.1 MG PO TABS
0.1000 mg | ORAL_TABLET | Freq: Every day | ORAL | Status: DC | PRN
  Filled 2020-07-21: qty 1

## 2020-07-21 MED ORDER — EPINEPHRINE 0.3 MG/0.3ML IJ SOAJ
0.3000 mg | Freq: Once | INTRAMUSCULAR | Status: AC
  Administered 2020-07-21: 0.3 mg via INTRAMUSCULAR
  Filled 2020-07-21: qty 0.3

## 2020-07-21 MED ORDER — ALBUTEROL SULFATE HFA 108 (90 BASE) MCG/ACT IN AERS
2.0000 | INHALATION_SPRAY | Freq: Four times a day (QID) | RESPIRATORY_TRACT | Status: DC
  Administered 2020-07-22 – 2020-07-25 (×15): 2 via RESPIRATORY_TRACT
  Filled 2020-07-21 (×2): qty 6.7

## 2020-07-21 MED ORDER — FLUTICASONE PROPIONATE 50 MCG/ACT NA SUSP
2.0000 | Freq: Every day | NASAL | Status: DC
  Administered 2020-07-22 – 2020-07-25 (×4): 2 via NASAL
  Filled 2020-07-21: qty 16

## 2020-07-21 MED ORDER — LACTATED RINGERS IV BOLUS
500.0000 mL | Freq: Once | INTRAVENOUS | Status: AC
  Administered 2020-07-21: 500 mL via INTRAVENOUS

## 2020-07-21 MED ORDER — ALBUTEROL SULFATE HFA 108 (90 BASE) MCG/ACT IN AERS
6.0000 | INHALATION_SPRAY | Freq: Once | RESPIRATORY_TRACT | Status: AC
  Administered 2020-07-21: 6 via RESPIRATORY_TRACT
  Filled 2020-07-21: qty 6.7

## 2020-07-21 MED ORDER — ALBUTEROL SULFATE HFA 108 (90 BASE) MCG/ACT IN AERS
2.0000 | INHALATION_SPRAY | Freq: Four times a day (QID) | RESPIRATORY_TRACT | Status: DC | PRN
  Filled 2020-07-21: qty 6.7

## 2020-07-21 MED ORDER — TRAZODONE HCL 50 MG PO TABS: 25.0000 mg | ORAL_TABLET | Freq: Every evening | ORAL | Status: DC | PRN

## 2020-07-21 MED ORDER — SODIUM CHLORIDE 0.9 % IV SOLN: INTRAVENOUS | Status: DC

## 2020-07-21 MED ORDER — SODIUM CHLORIDE 0.9 % IV SOLN: 200.0000 mg | Freq: Once | INTRAVENOUS | Status: DC

## 2020-07-21 MED ORDER — SPIRONOLACTONE 25 MG PO TABS
25.0000 mg | ORAL_TABLET | Freq: Every day | ORAL | Status: DC
  Administered 2020-07-22 – 2020-07-25 (×4): 25 mg via ORAL
  Filled 2020-07-21 (×4): qty 1

## 2020-07-21 MED ORDER — LORATADINE 10 MG PO TABS
10.0000 mg | ORAL_TABLET | Freq: Every day | ORAL | Status: DC
  Administered 2020-07-22 – 2020-07-25 (×4): 10 mg via ORAL
  Filled 2020-07-21 (×4): qty 1

## 2020-07-21 MED ORDER — VITAMIN D 25 MCG (1000 UNIT) PO TABS
1000.0000 [IU] | ORAL_TABLET | Freq: Every day | ORAL | Status: DC
  Administered 2020-07-21 – 2020-07-25 (×5): 1000 [IU] via ORAL
  Filled 2020-07-21 (×5): qty 1

## 2020-07-21 MED ORDER — MONTELUKAST SODIUM 10 MG PO TABS
10.0000 mg | ORAL_TABLET | Freq: Every day | ORAL | Status: DC
  Administered 2020-07-21 – 2020-07-24 (×4): 10 mg via ORAL
  Filled 2020-07-21 (×4): qty 1

## 2020-07-21 MED ORDER — ENOXAPARIN SODIUM 40 MG/0.4ML ~~LOC~~ SOLN
40.0000 mg | SUBCUTANEOUS | Status: DC
  Administered 2020-07-22 – 2020-07-24 (×4): 40 mg via SUBCUTANEOUS
  Filled 2020-07-21 (×4): qty 0.4

## 2020-07-21 MED ORDER — HYDROCOD POLST-CPM POLST ER 10-8 MG/5ML PO SUER
5.0000 mL | Freq: Two times a day (BID) | ORAL | Status: DC | PRN
  Administered 2020-07-21 – 2020-07-24 (×3): 5 mL via ORAL
  Filled 2020-07-21 (×3): qty 5

## 2020-07-21 MED ORDER — FAMOTIDINE 20 MG PO TABS
20.0000 mg | ORAL_TABLET | Freq: Two times a day (BID) | ORAL | Status: DC
  Administered 2020-07-21 – 2020-07-25 (×8): 20 mg via ORAL
  Filled 2020-07-21 (×8): qty 1

## 2020-07-21 MED ORDER — ONDANSETRON HCL 4 MG PO TABS: 4.0000 mg | ORAL_TABLET | Freq: Four times a day (QID) | ORAL | Status: DC | PRN

## 2020-07-21 MED ORDER — SENNOSIDES-DOCUSATE SODIUM 8.6-50 MG PO TABS: 1.0000 | ORAL_TABLET | Freq: Every evening | ORAL | Status: DC | PRN

## 2020-07-21 MED ORDER — ALBUTEROL SULFATE HFA 108 (90 BASE) MCG/ACT IN AERS
2.0000 | INHALATION_SPRAY | RESPIRATORY_TRACT | Status: DC | PRN
  Filled 2020-07-21: qty 6.7

## 2020-07-21 MED ORDER — METHYLPREDNISOLONE SODIUM SUCC 125 MG IJ SOLR
125.0000 mg | Freq: Once | INTRAMUSCULAR | Status: AC
  Administered 2020-07-21: 125 mg via INTRAVENOUS
  Filled 2020-07-21: qty 2

## 2020-07-21 MED ORDER — ASCORBIC ACID 500 MG PO TABS
500.0000 mg | ORAL_TABLET | Freq: Every day | ORAL | Status: DC
  Administered 2020-07-21 – 2020-07-25 (×5): 500 mg via ORAL
  Filled 2020-07-21 (×5): qty 1

## 2020-07-21 MED ORDER — ACETAMINOPHEN 325 MG PO TABS
650.0000 mg | ORAL_TABLET | Freq: Four times a day (QID) | ORAL | Status: DC | PRN
  Administered 2020-07-22 – 2020-07-23 (×3): 650 mg via ORAL
  Filled 2020-07-21 (×3): qty 2

## 2020-07-21 NOTE — ED Triage Notes (Addendum)
Pt comes from [facility] with c/o SOb, dizziness following antibody infusion. Pt placed on 2L and unable to get reading at this time. Pt is COVID+

## 2020-07-21 NOTE — Consult Note (Addendum)
Remdesivir - Pharmacy Brief Note   O:  ALT: 25 CXR: No active disease SpO2: Hypoxic requiring supplemental oxygen   A/P:  07/16/20 SARS-CoV-2 (+)  Per chart, patient is presenting to the ED for suspected infusion reaction after receiving MAB for COVID-19  Remdesivir 200 mg IVPB once followed by 100 mg IVPB daily x 4 days.   Benita Gutter 07/21/2020 8:43 PM

## 2020-07-21 NOTE — ED Notes (Signed)
Report called . Pt transported via [place] .

## 2020-07-21 NOTE — ED Triage Notes (Signed)
Pt here from [facility] clinic after reaction to antibody infusion, patient on day 9 of covid. Pt has hx of COPD but is typically on RA. Pt's O2 after complete infusion was 88%. Pt very pale on assessment, BP 73/42.

## 2020-07-21 NOTE — H&P (Signed)
[place]   PATIENT NAME: Kristen Simon    MR#:  [id number]  DATE OF BIRTH:  12-02-1950  DATE OF ADMISSION:  07/21/2020  PRIMARY CARE PHYSICIAN: Maryland Pink, MD   REQUESTING/REFERRING PHYSICIAN: Hulan Saas, MD  CHIEF COMPLAINT:   Chief Complaint  Patient presents with   Infusion Reaction    HISTORY OF PRESENT ILLNESS:  Kristen Simon  is a 69 y.o. Caucasian female with a known history of multiple medical problems that are mentioned below including COPD, hypertension, GERD and recent COVID-19 diagnosis on 11/19 for which she was receiving monoclonal antibody infusion today at the clinic and started having dyspnea and wheezing with hypotension which has been having respiratory symptoms including dry cough for more than a week.  She was tachypneic and tachycardic.  She had a new O2 requirement of 2 L/min.  She usually uses oxygen on as needed basis only at bedtime.  Prior to her Covid diagnosis she has been having chills without measured fever, loss of taste and smell, fatigue and weakness in addition to dry cough, dyspnea and wheezing.  She has been having nausea without vomiting or diarrhea.  Upon presentation to the emergency room, blood pressure was 73/42 and with hydration Came up to 123/59 then dropped to 56/38 and pulse symmetry was 99% on 3 L of O2 by nasal cannula and later 93 to 94% on 1 L.  Labs revealed VBG with pH 7.35 and HCO3 of 25.9 and CMP was unremarkable.  High-sensitivity troponin I was 4 and later the same and procalcitonin less than 0.1.  CBC was within normal.  Chest x-ray showed no acute cardiopulmonary disease.  The patient was given nebulized albuterol as well as IV Benadryl subcu epi, 20 mg of IV Pepcid and 125 mg of IV Solu-Medrol as well as duo nebs 500 mL IV lactated Ringer bolus and 2 g of IV magnesium sulfate.  She will be admitted to a medically monitored bed for further evaluation and management.  PAST MEDICAL HISTORY:   Past  Medical History:  Diagnosis Date   Allergy    Anemia    Breast screening, unspecified    COPD (chronic obstructive pulmonary disease) ([place])    Dysphagia    Elevated cholesterol    Fatty liver    GERD (gastroesophageal reflux disease)    Groin mass    History of cancer chemotherapy 2013   History of rectal bleeding    Hx of radiation therapy 2013   Hypertension 2010   Internal hemorrhoids with other complication 4098   Lymphoma ([place])    Personal history of chemotherapy    Personal history of radiation therapy    Personal history of tobacco use, presenting hazards to health    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    lymphoma 2013   Ulcer 1970   stomach-resolved   Unilateral or unspecified femoral hernia with obstruction     PAST SURGICAL HISTORY:   Past Surgical History:  Procedure Laterality Date   ANOSCOPY  03/27/2012   anal biopsy/hemorrhoid-benign   APPENDECTOMY  1970   COLONOSCOPY WITH PROPOFOL N/A 09/30/2018   Procedure: COLONOSCOPY WITH PROPOFOL;  Surgeon: Manya Silvas, MD;  Location: [facility] ENDOSCOPY;  Service: Endoscopy;  Laterality: N/A;   ESOPHAGOGASTRODUODENOSCOPY N/A 09/30/2018   Procedure: ESOPHAGOGASTRODUODENOSCOPY (EGD);  Surgeon: Manya Silvas, MD;  Location: [facility] ENDOSCOPY;  Service: Endoscopy;  Laterality: N/A;   FRACTURE SURGERY  [phone]  right wrist,right leg   GROIN MASS OPEN BIOPSY Right 01/24/2012   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   HERNIA REPAIR Right 01/10/2012   right femerol hernia   PATELLA FRACTURE SURGERY Right 1982   WRIST SURGERY Right     SOCIAL HISTORY:   Social History   Tobacco Use   Smoking status: Former Smoker    Packs/day: 0.88    Years: 40.00    Pack years: 35.20    Quit date: 08/01/2015    Years since quitting: 4.9   Smokeless tobacco: Never Used  Substance Use Topics   Alcohol  use: Yes    FAMILY HISTORY:   Family History  Problem Relation Age of Onset   Cancer Mother        lung   Breast cancer Maternal Aunt     DRUG ALLERGIES:   Allergies  Allergen Reactions   Amlodipine Other (See Comments)    Muscle cramps   Citalopram Other (See Comments)    Felt like in a tunnel   Gabapentin Other (See Comments)    Felt like in a tunnel   Pregabalin Other (See Comments)    Felt like in a tunnel   Statins     Cramping, hives   Tiotropium Other (See Comments)    REVIEW OF SYSTEMS:   ROS As per history of present illness. All pertinent systems were reviewed above. Constitutional, HEENT, cardiovascular, respiratory, GI, GU, musculoskeletal, neuro, psychiatric, endocrine, integumentary and hematologic systems were reviewed and are otherwise negative/unremarkable except for positive findings mentioned above in the HPI.   MEDICATIONS AT HOME:   Prior to Admission medications   Medication Sig Start Date End Date Taking? Authorizing Provider  Acetaminophen (TYLENOL ARTHRITIS PAIN PO) Take 650 mg 3 (three) times daily by mouth.    [provider]  albuterol (PROVENTIL HFA;VENTOLIN HFA) 108 (90 BASE) MCG/ACT inhaler Inhale into the lungs every 6 (six) hours as needed for wheezing or shortness of breath.    [provider]  aspirin 81 MG tablet Take 81 mg by mouth daily.    [provider]  budesonide-formoterol (SYMBICORT) 80-4.5 MCG/ACT inhaler Inhale 2 puffs into the lungs 2 (two) times daily.    [provider]  Calcium Carbonate-Vit D-Min (CALTRATE 600+D PLUS MINERALS) 600-800 MG-UNIT CHEW Chew 1 tablet 3 (three) times a week by mouth.    [provider]  carvedilol (COREG) 25 MG tablet Take 25 mg by mouth 2 (two) times daily. 06/11/19   [provider]  Cholecalciferol 125 MCG (5000 UT) capsule Take by mouth.    [provider]  cloNIDine (CATAPRES) 0.1 MG tablet Take 1 tablet (0.1 mg total)  by mouth daily as needed (blood pressure > 180/110). 11/10/18 07/02/20  Harvest Dark, MD  fluticasone (FLONASE) 50 MCG/ACT nasal spray Place 2 sprays into the nose daily. 04/05/15 12/22/56  [provider]  guaiFENesin (MUCINEX) 600 MG 12 hr tablet Take by mouth.    [provider]  Lactobacillus ([employer] PROBIOTIC ACIDOPHILUS) 10 MG CAPS Take 1 tablet by mouth daily.    [provider]  loratadine (CLARITIN REDITABS) 10 MG dissolvable tablet Take 10 mg by mouth daily.    [provider]  meclizine (ANTIVERT) 25 MG tablet Take by mouth.    [provider]  montelukast (SINGULAIR) 10 MG tablet Take 10 mg by mouth at bedtime.    [provider]  Omega-3 Fatty Acids (RA FISH OIL) 1000 MG CAPS  Take 1 capsule by mouth daily.    [provider]  senna-docusate (SENOKOT-S) 8.6-50 MG tablet Take 1 tablet by mouth at bedtime as needed. 12/23/15   Forest Gleason, MD  spironolactone (ALDACTONE) 25 MG tablet Take 25 mg by mouth daily. 06/22/20   [provider]  telmisartan (MICARDIS) 80 MG tablet Take by mouth. 07/01/20   [provider]  vitamin E 180 MG (400 UNITS) capsule Take by mouth.    [provider]      VITAL SIGNS:  Blood pressure (!) 159/53, pulse 92, temperature 98.8 F (37.1 C), temperature source Oral, resp. rate 19, height 5\' 2"  (1.575 m), weight 76.7 kg, SpO2 94 %.  PHYSICAL EXAMINATION:  Physical Exam  GENERAL:  69 y.o.-year-old Caucasian female patient lying in the bed with minimal respiratory distress with conversational dyspnea.   EYES: Pupils equal, round, reactive to light and accommodation. No scleral icterus. Extraocular muscles intact.  HEENT: Head atraumatic, normocephalic. Oropharynx and nasopharynx clear.  NECK:  Supple, no jugular venous distention. No thyroid enlargement, no tenderness.  LUNGS: Diminished bibasal breath sounds with diffuse expiratory wheezes.Marland Kitchen  CARDIOVASCULAR: Regular  rate and rhythm, S1, S2 normal. No murmurs, rubs, or gallops.  ABDOMEN: Soft, nondistended, nontender. Bowel sounds present. No organomegaly or mass.  EXTREMITIES: No pedal edema, cyanosis, or clubbing.  NEUROLOGIC: Cranial nerves II through XII are intact. Muscle strength 5/5 in all extremities. Sensation intact. Gait not checked.  PSYCHIATRIC: The patient is alert and oriented x 3.  Normal affect and good eye contact. SKIN: No obvious rash, lesion, or ulcer.   LABORATORY PANEL:   CBC Recent Labs  Lab 07/21/20 1724  WBC 5.2  HGB 13.1  HCT 38.4  PLT 230   ------------------------------------------------------------------------------------------------------------------  Chemistries  Recent Labs  Lab 07/21/20 1724  NA 137  K 4.0  CL 101  CO2 25  GLUCOSE 124*  BUN 16  CREATININE 0.92  CALCIUM 9.1  MG 1.5*  AST 27  ALT 25  ALKPHOS 41  BILITOT 0.6   ------------------------------------------------------------------------------------------------------------------  Cardiac Enzymes No results for input(s): TROPONINI in the last 168 hours. ------------------------------------------------------------------------------------------------------------------  RADIOLOGY:  DG Chest 1 View  Result Date: 07/21/2020 CLINICAL DATA:  Shortness of breath, COVID positive. EXAM: CHEST  1 VIEW COMPARISON:  Chest x-ray 11/10/2018, CT chest 05/06/2020. FINDINGS: The heart size and mediastinal contours are within normal limits. No focal consolidation. No pulmonary edema. No pleural effusion. No pneumothorax. No acute osseous abnormality. IMPRESSION: No active disease. Electronically Signed   By: Iven Finn M.D.   On: 07/21/2020 18:24      IMPRESSION AND PLAN:  1.  Suspected anaphylactic reaction to monoclonal antibody infusion. -The patient will be admitted to a medically monitored bed. -We will continue IV steroid therapy with IV Solu-Medrol as well as H1 and H2 blockers. -We will  utilize subcu/IM epi as needed. -The patient will be placed on bronchodilator therapy with Combivent.  2.  Acute hypoxemic respiratory failure secondary to COVID-19. -The patient will be admitted to a medically monitored isolation bed. -O2 protocol will be followed to keep O2 saturation above 93.   3.  Multifocal pneumonia secondary to COVID-19. -The patient will be admitted to an isolation monitored bed with droplet and contact precautions. -Given multifocal pneumonia we will empirically place the patient on IV Rocephin and Zithromax for possible bacterial superinfection only with elevated Procalcitonin. -The patient will be placed on scheduled Mucinex and as needed Tussionex. -We will avoid nebulization as much as  we can, give bronchodilator MDI if needed, and with deterioration of oxygenation try to avoid BiPAP/CPAP if possible.    -Will obtain sputum Gram stain culture and sensitivity and follow blood cultures. -O2 protocol will be followed. -We will follow CRP, ferritin, LDH and D-dimer. -Will follow manual differential for ANC/ALC ratio as well as follow troponin I and daily CBC with manual differential and CMP. - Will place the patient on IV Remdesivir and IV steroid therapy with IV Solu-Medrol with elevated inflammatory markers. -The patient will be placed on vitamin D3, vitamin C, zinc sulfate, p.o. Pepcid and aspirin.  4.  Essential hypertension. -We will continue her antihypertensives.  5.  DVT prophylaxis. -Subcutaneous Lovenox.    All the records are reviewed and case discussed with ED provider. The plan of care was discussed in details with the patient (and family). I answered all questions. The patient agreed to proceed with the above mentioned plan. Further management will depend upon hospital course.   CODE STATUS: Full code  Status is: Inpatient  Remains inpatient appropriate because:Hemodynamically unstable, Ongoing diagnostic testing needed not appropriate for  outpatient work up, Unsafe d/c plan, IV treatments appropriate due to intensity of illness or inability to take PO and Inpatient level of care appropriate due to severity of illness   Dispo: The patient is from: Home              Anticipated d/c is to: Home              Anticipated d/c date is: 2 days              Patient currently is not medically stable to d/c.    TOTAL TIME TAKING CARE OF THIS PATIENT: 55 minutes.    Christel Mormon M.D on 07/21/2020 at 8:59 PM  [facility]   From 7 PM-7 AM, contact night-coverage [email or web address]  CC: Primary care physician; Maryland Pink, MD

## 2020-07-21 NOTE — ED Provider Notes (Signed)
[facility] Emergency Department Provider Note  ____________________________________________   First MD Initiated Contact with Patient 07/21/20 1720     (approximate)  I have reviewed the triage vital signs and the nursing notes.   HISTORY  Chief Complaint Infusion Reaction   HPI Kristen Simon is a 69 y.o. female with past medical history of COPD not currently smoking, HTN, HDL, GERD, lymphoma not currently undergoing treatment and recent diagnosis of Covid on 11/19 who presents for assessment after she developed some shortness of breath and nausea following an outpatient antibody infusion for her Covid illness.  Patient states he has had some shortness of breath and cough as well as some nausea and diarrhea but denies any chest pain, earache, headache, abdominal pain, back pain, extremity pain, rash, or urinary symptoms.  Denies any prior similar allergic reactions.  She states all of her symptoms began after she received antibiotic fusion.  Denies any other acute concerns at this time.      Past Medical History:  Diagnosis Date  . Allergy   . Anemia   . Breast screening, unspecified   . COPD (chronic obstructive pulmonary disease) ([place])   . Dysphagia   . Elevated cholesterol   . Fatty liver   . GERD (gastroesophageal reflux disease)   . Groin mass   . History of cancer chemotherapy 2013  . History of rectal bleeding   . Hx of radiation therapy 2013  . Hypertension 2010  . Internal hemorrhoids with other complication 2979  . Lymphoma ([place])   . Personal history of chemotherapy   . Personal history of radiation therapy   . Personal history of tobacco use, presenting hazards to health   . 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    lymphoma 2013  . Ulcer 1970   stomach-resolved  . Unilateral or unspecified femoral hernia with obstruction     Patient Active Problem List   Diagnosis Date  Noted  . Long term current use of opiate analgesic 03/19/2017  . Long term prescription opiate use 03/19/2017  . Opiate use 03/19/2017  . Chronic pain syndrome 03/19/2017  . Chronic low back pain, unspecified back pain laterality, with sciatica presence unspecified (primary) (bilateral) (R>L) 03/19/2017  . Chronic neck pain 03/19/2017  . Pain in both lower extremities (tertiary)(R>L) 03/19/2017  . Bilateral groin pain (secondary) (R>L) 03/19/2017  . Sacroiliac joint pain 03/19/2017  . Primary cervical cancer ([place]) 12/29/2016  . Personal history of tobacco use, presenting hazards to health 01/13/2016  . Benign essential HTN 01/29/2015  . Mixed hyperlipidemia 01/29/2015  . Shortness of breath 01/29/2015  . Cough 01/02/2014  . Secondary malignant neoplasm of lymph nodes of inguinal region or lower extremity ([place])   . History of cancer chemotherapy     Past Surgical History:  Procedure Laterality Date  . ANOSCOPY  03/27/2012   anal biopsy/hemorrhoid-benign  . APPENDECTOMY  1970  . COLONOSCOPY WITH PROPOFOL N/A 09/30/2018   Procedure: COLONOSCOPY WITH PROPOFOL;  Surgeon: Manya Silvas, MD;  Location: [facility] ENDOSCOPY;  Service: Endoscopy;  Laterality: N/A;  . ESOPHAGOGASTRODUODENOSCOPY N/A 09/30/2018   Procedure: ESOPHAGOGASTRODUODENOSCOPY (EGD);  Surgeon: Manya Silvas, MD;  Location: [facility] ENDOSCOPY;  Service: Endoscopy;  Laterality: N/A;  . FRACTURE SURGERY  8921,1941   right wrist,right leg  . GROIN MASS OPEN BIOPSY Right 01/24/2012   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  . HERNIA REPAIR Right 01/10/2012  right femerol hernia  . PATELLA FRACTURE SURGERY Right 1982  . WRIST SURGERY Right     Prior to Admission medications   Medication Sig Start Date End Date Taking? Authorizing Provider  Acetaminophen (TYLENOL ARTHRITIS PAIN PO) Take 650 mg 3 (three) times daily by mouth.    [provider]  albuterol (PROVENTIL HFA;VENTOLIN  HFA) 108 (90 BASE) MCG/ACT inhaler Inhale into the lungs every 6 (six) hours as needed for wheezing or shortness of breath.    [provider]  aspirin 81 MG tablet Take 81 mg by mouth daily.    [provider]  budesonide-formoterol (SYMBICORT) 80-4.5 MCG/ACT inhaler Inhale 2 puffs into the lungs 2 (two) times daily.    [provider]  Calcium Carbonate-Vit D-Min (CALTRATE 600+D PLUS MINERALS) 600-800 MG-UNIT CHEW Chew 1 tablet 3 (three) times a week by mouth.    [provider]  carvedilol (COREG) 25 MG tablet Take 25 mg by mouth 2 (two) times daily. 06/11/19   [provider]  Cholecalciferol 125 MCG (5000 UT) capsule Take by mouth.    [provider]  cloNIDine (CATAPRES) 0.1 MG tablet Take 1 tablet (0.1 mg total) by mouth daily as needed (blood pressure > 180/110). 11/10/18 07/02/20  Harvest Dark, MD  fluticasone (FLONASE) 50 MCG/ACT nasal spray Place 2 sprays into the nose daily. 04/05/15 12/22/56  [provider]  guaiFENesin (MUCINEX) 600 MG 12 hr tablet Take by mouth.    [provider]  Lactobacillus ([employer] PROBIOTIC ACIDOPHILUS) 10 MG CAPS Take 1 tablet by mouth daily.    [provider]  loratadine (CLARITIN REDITABS) 10 MG dissolvable tablet Take 10 mg by mouth daily.    [provider]  meclizine (ANTIVERT) 25 MG tablet Take by mouth.    [provider]  montelukast (SINGULAIR) 10 MG tablet Take 10 mg by mouth at bedtime.    [provider]  Omega-3 Fatty Acids (RA FISH OIL) 1000 MG CAPS Take 1 capsule by mouth daily.    [provider]  senna-docusate (SENOKOT-S) 8.6-50 MG tablet Take 1 tablet by mouth at bedtime as needed. 12/23/15   Forest Gleason, MD  spironolactone (ALDACTONE) 25 MG tablet Take 25 mg by mouth daily. 06/22/20   [provider]  telmisartan (MICARDIS) 80 MG tablet Take by mouth. 07/01/20   [provider]  vitamin E 180 MG (400 UNITS)  capsule Take by mouth.    [provider]    Allergies Amlodipine, Citalopram, Gabapentin, Pregabalin, Statins, and Tiotropium  Family History  Problem Relation Age of Onset  . Cancer Mother        lung  . Breast cancer Maternal Aunt     Social History Social History   Tobacco Use  . Smoking status: Former Smoker    Packs/day: 0.88    Years: 40.00    Pack years: 35.20    Quit date: 08/01/2015    Years since quitting: 4.9  . Smokeless tobacco: Never Used  Vaping Use  . Vaping Use: Never used  Substance Use Topics  . Alcohol use: Yes  . Drug use: No    Review of Systems  Review of Systems  Constitutional: Positive for malaise/fatigue. Negative for chills and fever.  HENT: Negative for sore throat.   Eyes: Negative for pain.  Respiratory: Positive for shortness of breath. Negative for cough and stridor.   Cardiovascular: Negative for chest pain.  Gastrointestinal: Positive for nausea. Negative for vomiting.  Genitourinary: Negative for  dysuria.  Musculoskeletal: Negative for myalgias.  Skin: Negative for rash.  Neurological: Positive for weakness. Negative for seizures, loss of consciousness and headaches.  Psychiatric/Behavioral: Negative for suicidal ideas.  All other systems reviewed and are negative.     ____________________________________________   PHYSICAL EXAM:  VITAL SIGNS: ED Triage Vitals  Enc Vitals Group     BP 07/21/20 1702 (!) 73/42     Pulse Rate 07/21/20 1702 76     Resp 07/21/20 1702 (!) 22     Temp 07/21/20 1702 98.4 F (36.9 C)     Temp Source 07/21/20 1702 Axillary     SpO2 07/21/20 1702 99 %     Weight 07/21/20 1703 169 lb 1.5 oz (76.7 kg)     Height 07/21/20 1703 5\' 2"  (1.575 m)     Head Circumference --      Peak Flow --      Pain Score 07/21/20 1702 0     Pain Loc --      Pain Edu? --      Excl. in [place]? --    Vitals:   07/21/20 1915 07/21/20 2010  BP: (!) 150/54 (!) 159/53  Pulse: 90 92  Resp: (!) 23 19  Temp:   98.8 F (37.1 C)  SpO2: 95% 94%   Physical Exam Vitals and nursing note reviewed.  Constitutional:      General: She is not in acute distress.    Appearance: She is well-developed.  HENT:     Head: Normocephalic and atraumatic.     Right Ear: External ear normal.     Left Ear: External ear normal.     Nose: Nose normal.  Eyes:     Conjunctiva/sclera: Conjunctivae normal.  Cardiovascular:     Rate and Rhythm: Normal rate and regular rhythm.     Heart sounds: No murmur heard.   Pulmonary:     Effort: Tachypnea and respiratory distress present.     Breath sounds: Wheezing present.  Abdominal:     Palpations: Abdomen is soft.     Tenderness: There is no abdominal tenderness. There is no right CVA tenderness or left CVA tenderness.  Musculoskeletal:     Cervical back: Neck supple.  Skin:    General: Skin is warm and dry.     Capillary Refill: Capillary refill takes 2 to 3 seconds.  Neurological:     Mental Status: She is alert and oriented to person, place, and time.  Psychiatric:        Mood and Affect: Mood normal.      ____________________________________________   LABS (all labs ordered are listed, but only abnormal results are displayed)  Labs Reviewed  COMPREHENSIVE METABOLIC PANEL - Abnormal; Notable for the following components:      Result Value   Glucose, Bld 124 (*)    All other components within normal limits  MAGNESIUM - Abnormal; Notable for the following components:   Magnesium 1.5 (*)    All other components within normal limits  CBC WITH DIFFERENTIAL/PLATELET  BLOOD GAS, VENOUS  PROCALCITONIN  URINALYSIS, COMPLETE (UACMP) WITH MICROSCOPIC  FIBRIN DERIVATIVES D-DIMER ([facility] ONLY)  TROPONIN I (HIGH SENSITIVITY)  TROPONIN I (HIGH SENSITIVITY)   ____________________________________________  EKG  Sinus rhythm with a ventricular rate of 77, normal axis, unremarkable's, nonspecific change in inferior leads without other clear evidence of acute  ischemia. ____________________________________________  RADIOLOGY  ED MD interpretation: No pneumothorax, clear focal consolidation, large effusion, or other acute thoracic abnormality.  Official radiology  report(s): DG Chest 1 View  Result Date: 07/21/2020 CLINICAL DATA:  Shortness of breath, COVID positive. EXAM: CHEST  1 VIEW COMPARISON:  Chest x-ray 11/10/2018, CT chest 05/06/2020. FINDINGS: The heart size and mediastinal contours are within normal limits. No focal consolidation. No pulmonary edema. No pleural effusion. No pneumothorax. No acute osseous abnormality. IMPRESSION: No active disease. Electronically Signed   By: Iven Finn M.D.   On: 07/21/2020 18:24    ____________________________________________   PROCEDURES  Procedure(s) performed (including Critical Care):  .1-3 Lead EKG Interpretation Performed by: Lucrezia Starch, MD Authorized by: Lucrezia Starch, MD     Interpretation: normal     ECG rate assessment: normal     Rhythm: sinus rhythm     Ectopy: none     Conduction: normal    .Critical Care Performed by: Lucrezia Starch, MD Authorized by: Lucrezia Starch, MD   Critical care provider statement:    Critical care time (minutes):  45   Critical care time was exclusive of:  Separately billable procedures and treating other patients   Critical care was necessary to treat or prevent imminent or life-threatening deterioration of the following conditions:  Respiratory failure (anaphylaxis requiring IM epi )   Critical care was time spent personally by me on the following activities:  Discussions with consultants, evaluation of patient's response to treatment, examination of patient, ordering and performing treatments and interventions, ordering and review of laboratory studies, ordering and review of radiographic studies, pulse oximetry, re-evaluation of patient's condition, obtaining history from patient or surrogate and review of old  charts     ____________________________________________   INITIAL IMPRESSION / [place] / ED COURSE        Patient presents with [place] to history exam for assessment of shortness of breath nausea reported hypoxia of an SPO2 of 80% after receiving Covid medical antibody infusion.  On arrival patient room air sat is 92%.  Her initial BP was 73/42 but on recheck was 123/59.  Exam remarkable for tachypnea and wheezing.  Differential includes but is not limited to ACS, arrhythmia, PE, superinfection of COVID-19 with bacterial pneumonia, anaphylaxis, COPD exacerbation, and metabolic derangements.  Given hypotension and wheezing on exam with atelectasis in the differential and symptom onset shortly after receiving infusion patient treated with IV fluids, IM epi, IV fluids, and antihistamines.  Patient was also treated with albuterol given she is a history of COPD has any O2 requirement of 2 L she may also be suffering from acute COPD exacerbation.  Is unclear if this is because related to her current COVID-19 diagnosis although given she does have a new hypoxic respiratory failure I did order remdesivir as well.  Low suspicion for acute bacterial infection given no fever and undetectable procalcitonin.  Troponin is not elevated and low suspicion for ACS or myocarditis at this time.  Given new oxygen requirement and persistent tachypnea we will plan to admit to medicine service for further evaluation management.  D-dimer to assess for PE risk.   ____________________________________________   FINAL CLINICAL IMPRESSION(S) / ED DIAGNOSES  Final diagnoses:  COVID-19  Wheezing  Hypotension, unspecified hypotension type  COPD exacerbation (HCC)  Hypomagnesemia    Medications  ipratropium-albuterol (DUONEB) 0.5-2.5 (3) MG/3ML nebulizer solution 9 mL (9 mLs Nebulization Not Given 07/21/20 1758)  methylPREDNISolone sodium succinate (SOLU-MEDROL) 125 mg/2 mL injection 125 mg (125 mg  Intravenous Given 07/21/20 1740)  lactated ringers bolus 500 mL (0 mLs Intravenous Stopped 07/21/20 1841)  EPINEPHrine (  EPI-PEN) injection 0.3 mg (0.3 mg Intramuscular Given 07/21/20 1740)  diphenhydrAMINE (BENADRYL) injection 25 mg (25 mg Intravenous Given 07/21/20 1749)  famotidine (PEPCID) IVPB 20 mg premix (0 mg Intravenous Stopped 07/21/20 1841)  albuterol (VENTOLIN HFA) 108 (90 Base) MCG/ACT inhaler 6 puff (6 puffs Inhalation Given 07/21/20 1847)  magnesium sulfate IVPB 2 g 50 mL (0 g Intravenous Stopped 07/21/20 2007)     ED Discharge Orders    None       Note:  This document was prepared using Dragon voice recognition software and may include unintentional dictation errors.   Lucrezia Starch, MD 07/21/20 2043

## 2020-07-22 ENCOUNTER — Encounter: Payer: Self-pay | Admitting: Family Medicine

## 2020-07-22 DIAGNOSIS — Z9221 Personal history of antineoplastic chemotherapy: Secondary | ICD-10-CM | POA: Diagnosis not present

## 2020-07-22 DIAGNOSIS — I10 Essential (primary) hypertension: Secondary | ICD-10-CM | POA: Diagnosis not present

## 2020-07-22 DIAGNOSIS — U071 COVID-19: Secondary | ICD-10-CM | POA: Diagnosis not present

## 2020-07-22 DIAGNOSIS — E782 Mixed hyperlipidemia: Secondary | ICD-10-CM

## 2020-07-22 DIAGNOSIS — T782XXA Anaphylactic shock, unspecified, initial encounter: Secondary | ICD-10-CM | POA: Diagnosis not present

## 2020-07-22 DIAGNOSIS — J9600 Acute respiratory failure, unspecified whether with hypoxia or hypercapnia: Secondary | ICD-10-CM | POA: Diagnosis present

## 2020-07-22 LAB — COMPREHENSIVE METABOLIC PANEL
ALT: 24 U/L (ref 0–44)
AST: 27 U/L (ref 15–41)
Albumin: 3.4 g/dL — ABNORMAL LOW (ref 3.5–5.0)
Alkaline Phosphatase: 35 U/L — ABNORMAL LOW (ref 38–126)
Anion gap: 10 (ref 5–15)
BUN: 16 mg/dL (ref 8–23)
CO2: 24 mmol/L (ref 22–32)
Calcium: 8.4 mg/dL — ABNORMAL LOW (ref 8.9–10.3)
Chloride: 103 mmol/L (ref 98–111)
Creatinine, Ser: 0.95 mg/dL (ref 0.44–1.00)
GFR, Estimated: >60 mL/min (ref >60)
Glucose, Bld: 183 mg/dL — ABNORMAL HIGH (ref 70–99)
Potassium: 4.5 mmol/L (ref 3.5–5.1)
Sodium: 137 mmol/L (ref 135–145)
Total Bilirubin: 0.7 mg/dL (ref 0.3–1.2)
Total Protein: 6.4 g/dL — ABNORMAL LOW (ref 6.5–8.1)

## 2020-07-22 LAB — CBC WITH DIFFERENTIAL/PLATELET
Abs Immature Granulocytes: 0.01 10*3/uL (ref 0.00–0.07)
Basophils Absolute: 0 10*3/uL (ref 0.0–0.1)
Basophils Relative: 0 %
Eosinophils Absolute: 0 10*3/uL (ref 0.0–0.5)
Eosinophils Relative: 0 %
HCT: 33.9 % — ABNORMAL LOW (ref 36.0–46.0)
Hemoglobin: 11.7 g/dL — ABNORMAL LOW (ref 12.0–15.0)
Immature Granulocytes: 0 %
Lymphocytes Relative: 29 %
Lymphs Abs: 1 10*3/uL (ref 0.7–4.0)
MCH: 31.2 pg (ref 26.0–34.0)
MCHC: 34.5 g/dL (ref 30.0–36.0)
MCV: 90.4 fL (ref 80.0–100.0)
Monocytes Absolute: 0.2 10*3/uL (ref 0.1–1.0)
Monocytes Relative: 4 %
Neutro Abs: 2.3 10*3/uL (ref 1.7–7.7)
Neutrophils Relative %: 67 %
Platelets: 169 10*3/uL (ref 150–400)
RBC: 3.75 MIL/uL — ABNORMAL LOW (ref 3.87–5.11)
RDW: 12.6 % (ref 11.5–15.5)
WBC: 3.4 10*3/uL — ABNORMAL LOW (ref 4.0–10.5)
nRBC: 0 % (ref 0.0–0.2)

## 2020-07-22 LAB — URINALYSIS, COMPLETE (UACMP) WITH MICROSCOPIC
Bacteria, UA: NONE SEEN
Bilirubin Urine: NEGATIVE
Glucose, UA: >=500 mg/dL — AB
Hgb urine dipstick: NEGATIVE
Ketones, ur: NEGATIVE mg/dL
Leukocytes,Ua: NEGATIVE
Nitrite: NEGATIVE
Protein, ur: NEGATIVE mg/dL
Specific Gravity, Urine: 1.023 (ref 1.005–1.030)
pH: 5 (ref 5.0–8.0)

## 2020-07-22 LAB — C-REACTIVE PROTEIN
CRP: 0.6 mg/dL (ref <1.0)
CRP: 2 mg/dL — ABNORMAL HIGH (ref <1.0)

## 2020-07-22 LAB — MAGNESIUM: Magnesium: 2 mg/dL (ref 1.7–2.4)

## 2020-07-22 LAB — FIBRIN DERIVATIVES D-DIMER (ARMC ONLY): Fibrin derivatives D-dimer (ARMC): 629.52 ng/mL (FEU) — ABNORMAL HIGH (ref 0.00–499.00)

## 2020-07-22 LAB — FERRITIN: Ferritin: 596 ng/mL — ABNORMAL HIGH (ref 11–307)

## 2020-07-22 MED ORDER — ORAL CARE MOUTH RINSE
15.0000 mL | Freq: Two times a day (BID) | OROMUCOSAL | Status: DC
  Administered 2020-07-22 – 2020-07-25 (×7): 15 mL via OROMUCOSAL

## 2020-07-22 NOTE — Progress Notes (Addendum)
PROGRESS NOTE  Kristen Simon IOE:[id number] DOB: 04-11-1951 DOA: 07/21/2020 PCP: [place] Pink, MD   LOS: 1 day   Brief narrative: As per HPI,  Kristen Simon  is a 69 y.o. Caucasian female with past medical history of COPD, hypertension, history of lymphoma status post chemotherapy, GERD and recent COVID-19 illness on 06/28/2018 2021 who was receiving monoclonal antibody  infusion at the clinic and started having dyspnea and wheezing with hypotension.  Patient has had dry cough for more than a week.  In the clinic she was noted to be tachycardic and tachypneic with hypoxia requiring 2 L of oxygen.  At baseline patient uses oxygen as needed only at bedtime.  Patient also reported chills without measured fever, loss of taste and smell, fatigue and weakness in addition to dry cough, dyspnea and wheezing with nausea.. In the ED, patient was hypotensive with blood pressure of 73/42.  Patient received IV fluid hydration with improvement in blood pressure initially. pulse oximetry in the ED was 99% on 3 L of O2 by nasal cannula and later 93 to 94% on 1 L.  Labs revealed VBG with pH 7.35 and HCO3 of 25.9 and CMP was unremarkable.  High-sensitivity troponin I was 4 and later the same and procalcitonin less than 0.1.  CBC was within normal.  Chest x-ray showed no acute cardiopulmonary disease.The patient was given nebulized albuterol as well as IV Benadryl subcu epi, 20 mg of IV Pepcid and 125 mg of IV Solu-Medrol as well as duo nebs 500 mL IV lactated Ringer bolus and 2 g of IV magnesium sulfate and was admitted to the hospital for further evaluation and treatment.   Assessment/Plan:  Active Problems:   Anaphylaxis  Suspected anaphylactic reaction to monoclonal antibody infusion. Patient was treated with IV fluids Solu-Medrol H1 and H2 blockers and as needed EpiPen..  Will discontinue IV fluids at this time.   Acute hypoxemic respiratory failure secondary to COVID-19.   Chest x-ray showed  multifocal pneumonia.  Patient was getting monoclonal antibody as outpatient.  Continue oxygen and supportive care.  Continue antibiotics, IV Solu-Medrol.   History of COPD, currently with acute hypoxic respiratory failure.  Continue supplemental oxygen at this time.  Was not on oxygen. Wean as possible.  Continue incentive spirometry.  Multifocal pneumonia secondary to COVID-19.   Mild elevated procalcitonin.  Was started on Rocephin and Zithromax as well.  Continue supportive care.  Bronchodilator inhalers, follow inflammatory biomarkers.  Continue remdesivir, IV steroids.  Continue vitamin D3, vitamin C, zinc sulfate, p.o. Pepcid and aspirin.  COVID-19 Labs  Recent Labs    07/21/20 2129 07/22/20 0606  FERRITIN 526* 596*  LDH 161  --   CRP 0.6  --    D-dimer was elevated at 629.  No results found for: [place]    Essential hypertension. Continue Avapro.  Closely monitor blood pressure.   DVT prophylaxis: enoxaparin (LOVENOX) injection 40 mg Start: 07/21/20 2200    Code Status: Full code  Family Communication: I spoke with the patient's husband Mr. Elta Guadeloupe  on the phone and updated him about the clinical condition of the patient.  Mr. Elta Guadeloupe stated that he was also going to be admitted for Covid in the same hospital.  Status is: Inpatient  Remains inpatient appropriate because:IV treatments appropriate due to intensity of illness or inability to take PO, Inpatient level of care appropriate due to severity of illness and Multifocal pneumonia secondary to Covid requiring oxygen supplementation, anaphylaxis to monoclonal antibody   Dispo:  The patient is from: Home              Anticipated d/c is to: Home              Anticipated d/c date is: 3 days              Patient currently is not medically stable to d/c.  Consultants:  None  Procedures:  None  Antibiotics:  . Remdesivir 11/24>  Anti-infectives (From admission, onward)    Start     Dose/Rate Route Frequency  Ordered Stop   07/22/20 1000  remdesivir 100 mg in sodium chloride 0.9 % 100 mL IVPB       "Followed by" Linked Group Details   100 mg 200 mL/hr over 30 Minutes Intravenous Daily 07/21/20 2048 07/26/20 0959   07/22/20 1000  remdesivir 100 mg in sodium chloride 0.9 % 100 mL IVPB  Status:  Discontinued       "Followed by" Linked Group Details   100 mg 200 mL/hr over 30 Minutes Intravenous Daily 07/21/20 2057 07/21/20 2123   07/21/20 2200  remdesivir 200 mg in sodium chloride 0.9% 250 mL IVPB       "Followed by" Linked Group Details   200 mg 580 mL/hr over 30 Minutes Intravenous Once 07/21/20 2048 07/22/20 0038   07/21/20 2100  remdesivir 200 mg in sodium chloride 0.9% 250 mL IVPB  Status:  Discontinued       "Followed by" Linked Group Details   200 mg 580 mL/hr over 30 Minutes Intravenous Once 07/21/20 2057 07/21/20 2123        Subjective: Today, patient was seen and examined at bedside.  Patient denies any chest pain, dizziness, lightheadedness, fever or chills.  Feels little better with breathing but he still has shortness of breath cough, blood pressure has improved.   Objective: Vitals:   07/21/20 2247 07/22/20 0551  BP: (!) 145/64 (!) 146/65  Pulse: 100 76  Resp:  17  Temp: 98.4 F (36.9 C) 98.4 F (36.9 C)  SpO2: 98% 99%    Intake/Output Summary (Last 24 hours) at 07/22/2020 0751 Last data filed at 07/22/2020 0160 Gross per 24 hour  Intake 1070.28 ml  Output 1350 ml  Net -279.72 ml   Filed Weights   07/21/20 1703 07/21/20 2247 07/22/20 0429  Weight: 76.7 kg 73.4 kg 74 kg   Body mass index is 29.84 kg/m.   Physical Exam: GENERAL: Patient is alert awake and oriented. Not in obvious distress.  On nasal cannula oxygen at 2 L/min HENT: No scleral pallor or icterus. Pupils equally reactive to light. Oral mucosa is moist NECK: is supple, no gross swelling noted. CHEST: Coarse breath sounds noted, diminished breath sounds bilaterally. CVS: S1 and S2 heard, no  murmur. Regular rate and rhythm.  ABDOMEN: Soft, non-tender, bowel sounds are present. EXTREMITIES: No edema. CNS: Cranial nerves are intact. No focal motor deficits. SKIN: warm and dry without rashes.  Data Review: I have personally reviewed the following laboratory data and studies,  CBC: Recent Labs  Lab 07/21/20 1724 07/22/20 0606  WBC 5.2 3.4*  NEUTROABS 2.9 2.3  HGB 13.1 11.7*  HCT 38.4 33.9*  MCV 90.8 90.4  PLT 230 109   Basic Metabolic Panel: Recent Labs  Lab 07/21/20 1724 07/22/20 0606  NA 137 137  K 4.0 4.5  CL 101 103  CO2 25 24  GLUCOSE 124* 183*  BUN 16 16  CREATININE 0.92 0.95  CALCIUM 9.1 8.4*  MG  1.5* 2.0   Liver Function Tests: Recent Labs  Lab 07/21/20 1724 07/22/20 0606  AST 27 27  ALT 25 24  ALKPHOS 41 35*  BILITOT 0.6 0.7  PROT 7.3 6.4*  ALBUMIN 4.1 3.4*   No results for input(s): LIPASE, AMYLASE in the last 168 hours. No results for input(s): AMMONIA in the last 168 hours. Cardiac Enzymes: No results for input(s): CKTOTAL, CKMB, CKMBINDEX, TROPONINI in the last 168 hours. BNP (last 3 results) Recent Labs    07/21/20 2129  BNP 25.4    ProBNP (last 3 results) No results for input(s): PROBNP in the last 8760 hours.  CBG: No results for input(s): GLUCAP in the last 168 hours. No results found for this or any previous visit (from the past 240 hour(s)).   Studies: DG Chest 1 View  Result Date: 07/21/2020 CLINICAL DATA:  Shortness of breath, COVID positive. EXAM: CHEST  1 VIEW COMPARISON:  Chest x-ray 11/10/2018, CT chest 05/06/2020. FINDINGS: The heart size and mediastinal contours are within normal limits. No focal consolidation. No pulmonary edema. No pleural effusion. No pneumothorax. No acute osseous abnormality. IMPRESSION: No active disease. Electronically Signed   By: Iven Finn M.D.   On: 07/21/2020 18:24      Flora Lipps, MD  [facility] 07/22/2020

## 2020-07-22 NOTE — Plan of Care (Signed)
  Problem: Respiratory: Goal: Will maintain a patent airway 07/22/2020 0151 by Tawni Millers, RN Outcome: Progressing Tolerating oxygen at [facility].  Saturations >90%

## 2020-07-23 DIAGNOSIS — J449 Chronic obstructive pulmonary disease, unspecified: Secondary | ICD-10-CM

## 2020-07-23 DIAGNOSIS — T782XXA Anaphylactic shock, unspecified, initial encounter: Secondary | ICD-10-CM | POA: Diagnosis not present

## 2020-07-23 DIAGNOSIS — Z9221 Personal history of antineoplastic chemotherapy: Secondary | ICD-10-CM | POA: Diagnosis not present

## 2020-07-23 DIAGNOSIS — U071 COVID-19: Secondary | ICD-10-CM | POA: Diagnosis not present

## 2020-07-23 DIAGNOSIS — I10 Essential (primary) hypertension: Secondary | ICD-10-CM | POA: Diagnosis not present

## 2020-07-23 LAB — COMPREHENSIVE METABOLIC PANEL
ALT: 26 U/L (ref 0–44)
AST: 31 U/L (ref 15–41)
Albumin: 3.4 g/dL — ABNORMAL LOW (ref 3.5–5.0)
Alkaline Phosphatase: 33 U/L — ABNORMAL LOW (ref 38–126)
Anion gap: 7 (ref 5–15)
BUN: 20 mg/dL (ref 8–23)
CO2: 25 mmol/L (ref 22–32)
Calcium: 8.4 mg/dL — ABNORMAL LOW (ref 8.9–10.3)
Chloride: 105 mmol/L (ref 98–111)
Creatinine, Ser: 0.69 mg/dL (ref 0.44–1.00)
GFR, Estimated: >60 mL/min (ref >60)
Glucose, Bld: 155 mg/dL — ABNORMAL HIGH (ref 70–99)
Potassium: 4.5 mmol/L (ref 3.5–5.1)
Sodium: 137 mmol/L (ref 135–145)
Total Bilirubin: 0.4 mg/dL (ref 0.3–1.2)
Total Protein: 6.3 g/dL — ABNORMAL LOW (ref 6.5–8.1)

## 2020-07-23 LAB — CBC WITH DIFFERENTIAL/PLATELET
Abs Immature Granulocytes: 0.04 10*3/uL (ref 0.00–0.07)
Basophils Absolute: 0 10*3/uL (ref 0.0–0.1)
Basophils Relative: 0 %
Eosinophils Absolute: 0 10*3/uL (ref 0.0–0.5)
Eosinophils Relative: 0 %
HCT: 32.8 % — ABNORMAL LOW (ref 36.0–46.0)
Hemoglobin: 11.5 g/dL — ABNORMAL LOW (ref 12.0–15.0)
Immature Granulocytes: 1 %
Lymphocytes Relative: 15 %
Lymphs Abs: 1.1 10*3/uL (ref 0.7–4.0)
MCH: 31.1 pg (ref 26.0–34.0)
MCHC: 35.1 g/dL (ref 30.0–36.0)
MCV: 88.6 fL (ref 80.0–100.0)
Monocytes Absolute: 0.4 10*3/uL (ref 0.1–1.0)
Monocytes Relative: 5 %
Neutro Abs: 5.6 10*3/uL (ref 1.7–7.7)
Neutrophils Relative %: 79 %
Platelets: 193 10*3/uL (ref 150–400)
RBC: 3.7 MIL/uL — ABNORMAL LOW (ref 3.87–5.11)
RDW: 12.3 % (ref 11.5–15.5)
WBC: 7.1 10*3/uL (ref 4.0–10.5)
nRBC: 0 % (ref 0.0–0.2)

## 2020-07-23 LAB — MAGNESIUM: Magnesium: 1.9 mg/dL (ref 1.7–2.4)

## 2020-07-23 LAB — FIBRIN DERIVATIVES D-DIMER (ARMC ONLY): Fibrin derivatives D-dimer (ARMC): 491.43 ng/mL (FEU) (ref 0.00–499.00)

## 2020-07-23 LAB — C-REACTIVE PROTEIN: CRP: 0.9 mg/dL (ref <1.0)

## 2020-07-23 LAB — FERRITIN: Ferritin: 806 ng/mL — ABNORMAL HIGH (ref 11–307)

## 2020-07-23 NOTE — Progress Notes (Signed)
[place] visited pt. per OR for AD education/completion; pt. sitting up in bed wearing nasal canula; she shared that she contracted COVID this past week and had a severe allergic reaction when she was treated at urgent care.  Pt.'s husband is also hospitalized with COVID currently.  Pt. appears to be coping fairly well; she says prayer is a major source of strength.  [place] prayed for pt.'s recovery and healing per pt.'s request.  [place] also gave pt. AD paperwork, suggesting she bring paperwork to bank for notarization post-discharge since getting witnesses and notary into room w/COVID precautions will be difficult.  PT. grateful for visit and requests visit for husband as well; [place] will attempt husband visit if possible.

## 2020-07-23 NOTE — Progress Notes (Signed)
PROGRESS NOTE  Kristen Simon SJG:[id number] DOB: 08-10-1951 DOA: 07/21/2020 PCP: [place] Pink, MD   LOS: 2 days   Brief narrative: As per HPI,  Kristen Simon  is a 70 y.o. Caucasian female with past medical history of COPD, hypertension, history of lymphoma status post chemotherapy, GERD and recent COVID-19 illness on 06/28/2018 2021 who was receiving monoclonal antibody  infusion at the clinic and started having dyspnea and wheezing with hypotension.  Patient has had dry cough for more than a week.  In the clinic she was noted to be tachycardic and tachypneic with hypoxia requiring 2 L of oxygen.  At baseline patient uses oxygen as needed only at bedtime.  Patient also reported chills without measured fever, loss of taste and smell, fatigue and weakness in addition to dry cough, dyspnea and wheezing with nausea.. In the ED, patient was hypotensive with blood pressure of 73/42.  Patient received IV fluid hydration with improvement in blood pressure initially. pulse oximetry in the ED was 99% on 3 L of O2 by nasal cannula and later 93 to 94% on 1 L.  Labs revealed VBG with pH 7.35 and HCO3 of 25.9 and CMP was unremarkable.  High-sensitivity troponin I was 4 and later the same and procalcitonin less than 0.1.  CBC was within normal.  Chest x-ray showed no acute cardiopulmonary disease.The patient was given nebulized albuterol as well as IV Benadryl subcu epi, 20 mg of IV Pepcid and 125 mg of IV Solu-Medrol as well as duo nebs 500 mL IV lactated Ringer bolus and 2 g of IV magnesium sulfate and was admitted to the hospital for further evaluation and treatment.   Assessment/Plan:  Principal Problem:   Anaphylaxis Active Problems:   History of cancer chemotherapy   Benign essential HTN   Mixed hyperlipidemia   Pneumonia due to COVID-19 virus   Respiratory failure, acute (HCC)  Suspected anaphylactic reaction to monoclonal antibody infusion. Resolved at this time.  Patient was treated  with IV fluids Solu-Medrol H1 and H2 blockers.  Off IV fluids at this time .  Has been restarted on Avapro since yesterday.  Acute hypoxemic respiratory failure secondary to COVID-19.   Chest x-ray showed multifocal pneumonia.  Patient was getting monoclonal antibody as outpatient.  Continue oxygen and supportive care.  Continue antibiotics, IV Solu-Medrol.  Will wean oxygen as able.  Currently on 2 L of oxygen by nasal cannula.   History of COPD, currently with acute hypoxic respiratory failure.  Continue supplemental oxygen at this time.  Was not on oxygen at home. Wean as possible.  Continue incentive spirometry.  Multifocal pneumonia secondary to COVID-19.   Mild elevated procalcitonin.  Was started on Rocephin and Zithromax.  Continue supportive care including.  Continue bronchodilators, follow inflammatory biomarkers.  Continue remdesivir, IV steroids.  Continue vitamin D3, vitamin C, zinc sulfate, p.o. Pepcid and aspirin.  COVID-19 Labs  Recent Labs    07/21/20 2129 07/22/20 0606 07/23/20 0434  FERRITIN 526* 596* 806*  LDH 161  --   --   CRP 0.6 2.0* 0.9    No results found for: SARSCOV2NAA D-dimer was elevated at 629.  No results found for: [place]    Essential hypertension. Continue Avapro.  Closely monitor blood pressure.   DVT prophylaxis: enoxaparin (LOVENOX) injection 40 mg Start: 07/21/20 2200   Code Status: Full code  Family Communication: I spoke with the patient's husband Mr. Elta Guadeloupe who is also hospitalized in the hospital.  Status is: Inpatient  Remains inpatient  appropriate because:IV treatments appropriate due to intensity of illness or inability to take PO, Inpatient level of care appropriate due to severity of illness and Multifocal pneumonia secondary to Covid requiring oxygen supplementation, anaphylaxis to monoclonal antibody   Dispo: The patient is from: Home              Anticipated d/c is to: Home              Anticipated d/c date is: 1 to 2  days              Patient currently is not medically stable to d/c.  Consultants:  None  Procedures:  None  Antibiotics:  . Remdesivir 11/24>  Subjective: Today, patient was seen and examined at bedside.  Patient denied chest pain panel, fever, chills.  Denies obvious dyspnea but has mild shortness of breath but with mild cough   Objective: Vitals:   07/22/20 2109 07/23/20 0252  BP: (!) 167/69 (!) 157/68  Pulse: 89 81  Resp: 19 18  Temp: 98.1 F (36.7 C) 98.8 F (37.1 C)  SpO2: 96% 98%    Intake/Output Summary (Last 24 hours) at 07/23/2020 0843 Last data filed at 07/22/2020 1938 Gross per 24 hour  Intake 840 ml  Output 751 ml  Net 89 ml   Filed Weights   07/21/20 2247 07/22/20 0429 07/23/20 0252  Weight: 73.4 kg 74 kg 74 kg   Body mass index is 29.83 kg/m.   Physical Exam: GENERAL: Patient is alert awake and oriented. Not in obvious distress.  On nasal cannula oxygen at 2 L/min HENT: No scleral pallor or icterus. Pupils equally reactive to light. Oral mucosa is moist NECK: is supple, no gross swelling noted. CHEST: Coarse breath sounds noted, diminished breath sounds bilaterally. CVS: S1 and S2 heard, no murmur. Regular rate and rhythm.  ABDOMEN: Soft, non-tender, bowel sounds are present. EXTREMITIES: No edema. CNS: Cranial nerves are intact. No focal motor deficits. SKIN: warm and dry without rashes.  Data Review: I have personally reviewed the following laboratory data and studies,  CBC: Recent Labs  Lab 07/21/20 1724 07/22/20 0606 07/23/20 0434  WBC 5.2 3.4* 7.1  NEUTROABS 2.9 2.3 5.6  HGB 13.1 11.7* 11.5*  HCT 38.4 33.9* 32.8*  MCV 90.8 90.4 88.6  PLT 230 169 294   Basic Metabolic Panel: Recent Labs  Lab 07/21/20 1724 07/22/20 0606  NA 137 137  K 4.0 4.5  CL 101 103  CO2 25 24  GLUCOSE 124* 183*  BUN 16 16  CREATININE 0.92 0.95  CALCIUM 9.1 8.4*  MG 1.5* 2.0   Liver Function Tests: Recent Labs  Lab 07/21/20 1724  07/22/20 0606  AST 27 27  ALT 25 24  ALKPHOS 41 35*  BILITOT 0.6 0.7  PROT 7.3 6.4*  ALBUMIN 4.1 3.4*   No results for input(s): LIPASE, AMYLASE in the last 168 hours. No results for input(s): AMMONIA in the last 168 hours. Cardiac Enzymes: No results for input(s): CKTOTAL, CKMB, CKMBINDEX, TROPONINI in the last 168 hours. BNP (last 3 results) Recent Labs    07/21/20 2129  BNP 25.4    ProBNP (last 3 results) No results for input(s): PROBNP in the last 8760 hours.  CBG: No results for input(s): GLUCAP in the last 168 hours. No results found for this or any previous visit (from the past 240 hour(s)).   Studies: DG Chest 1 View  Result Date: 07/21/2020 CLINICAL DATA:  Shortness of breath, COVID positive. EXAM: CHEST  1 VIEW COMPARISON:  Chest x-ray 11/10/2018, CT chest 05/06/2020. FINDINGS: The heart size and mediastinal contours are within normal limits. No focal consolidation. No pulmonary edema. No pleural effusion. No pneumothorax. No acute osseous abnormality. IMPRESSION: No active disease. Electronically Signed   By: Iven Finn M.D.   On: 07/21/2020 18:24      Flora Lipps, MD  [facility] 07/23/2020

## 2020-07-23 NOTE — TOC Initial Note (Signed)
Transition of Care [facility]) - Initial/Assessment Note    Patient Details  Name: Kristen Simon MRN: [id number] Date of Birth: 1951/05/10  [facility] CM/SW Contact:    Beverly Sessions, RN Phone Number: 07/23/2020, 3:23 PM  Clinical Narrative:                 Patient admitted from home with Suspected anaphylactic reaction to monoclonal antibody infusion.  Patient covid positive.  Her husband is also currently admitted with covid as well  Patient states she lives at home with her husband  PCP [facility]-  Denies issues obtaining medications  Patient states at baseline she is independent.  States there is a RW and cane in the home should she need them  Patient states at baseline she drives herself to appointments and run errands.   Patient currently requiring 2L acute O2    Expected Discharge Plan: Home/Self Care Barriers to Discharge: Continued Medical Work up   Patient Goals and CMS Choice        Expected Discharge Plan and Services Expected Discharge Plan: Home/Self Care   Discharge Planning Services: CM Consult   Living arrangements for the past 2 months: Single Family Home                                      Prior Living Arrangements/Services Living arrangements for the past 2 months: Single Family Home Lives with:: Spouse Patient language and need for interpreter reviewed:: Yes Do you feel safe going back to the place where you live?: Yes      Need for Family Participation in Patient Care: Yes (Comment) Care giver support system in place?: Yes (comment)   Criminal Activity/Legal Involvement Pertinent to Current Situation/Hospitalization: No - Comment as needed  Activities of Daily Living Home Assistive Devices/Equipment: None ADL Screening (condition at time of admission) Patient's cognitive ability adequate to safely complete daily activities?: Yes Is the patient deaf or have difficulty hearing?: No Does the  patient have difficulty seeing, even when wearing glasses/contacts?: No Does the patient have difficulty concentrating, remembering, or making decisions?: No Patient able to express need for assistance with ADLs?: Yes Does the patient have difficulty dressing or bathing?: No Independently performs ADLs?: Yes (appropriate for developmental age) Does the patient have difficulty walking or climbing stairs?: No Weakness of Legs: None Weakness of Arms/Hands: None  Permission Sought/Granted                  Emotional Assessment       Orientation: : Oriented to Self, Oriented to Place, Oriented to  Time, Oriented to Situation   Psych Involvement: No (comment)  Admission diagnosis:  Wheezing [R06.2] Hypomagnesemia [E83.42] Anaphylaxis [T78.2XXA] COPD exacerbation (HCC) [J44.1] Hypotension, unspecified hypotension type [I95.9] COVID-19 [U07.1] Patient Active Problem List   Diagnosis Date Noted  . Pneumonia due to COVID-19 virus 07/22/2020  . Respiratory failure, acute ([place]) 07/22/2020  . Anaphylaxis 07/21/2020  . Long term current use of opiate analgesic 03/19/2017  . Long term prescription opiate use 03/19/2017  . Opiate use 03/19/2017  . Chronic pain syndrome 03/19/2017  . Chronic low back pain, unspecified back pain laterality, with sciatica presence unspecified (primary) (bilateral) (R>L) 03/19/2017  . Chronic neck pain 03/19/2017  . Pain in both lower extremities (tertiary)(R>L) 03/19/2017  . Bilateral groin pain (secondary) (R>L) 03/19/2017  . Sacroiliac joint pain 03/19/2017  . Primary  cervical cancer ([place]) 12/29/2016  . Personal history of tobacco use, presenting hazards to health 01/13/2016  . Benign essential HTN 01/29/2015  . Mixed hyperlipidemia 01/29/2015  . Shortness of breath 01/29/2015  . Cough 01/02/2014  . 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 History of cancer chemotherapy    PCP:  [place] Pink,  MD Pharmacy:   [facility] [address] ([address]), [place]) [place] [address] Phone: [phone] Fax: [phone]     Social Determinants of Health ([place]) Interventions    Readmission Risk Interventions Readmission Risk Prevention Plan 07/23/2020  Transportation Screening Complete  Social Work Consult for [place] Planning/Counseling [place] Not Applicable  Medication Review [employer]) Complete  Some recent data might be hidden

## 2020-07-23 NOTE — Care Management Important Message (Signed)
Important Message  Patient Details  Name: Kristen Simon MRN: [id number] Date of Birth: 20-May-1951   Medicare Important Message Given:  N/A - LOS <3 / Initial given by admissions  Initial Medicare IM reviewed with patient by Meredith Mody, Patient Access Associate on 07/23/2020 at 11:56am.   Dannette Barbara 07/23/2020, 3:52 PM

## 2020-07-24 DIAGNOSIS — T782XXA Anaphylactic shock, unspecified, initial encounter: Secondary | ICD-10-CM | POA: Diagnosis not present

## 2020-07-24 DIAGNOSIS — U071 COVID-19: Secondary | ICD-10-CM | POA: Diagnosis not present

## 2020-07-24 DIAGNOSIS — Z9221 Personal history of antineoplastic chemotherapy: Secondary | ICD-10-CM | POA: Diagnosis not present

## 2020-07-24 DIAGNOSIS — I10 Essential (primary) hypertension: Secondary | ICD-10-CM | POA: Diagnosis not present

## 2020-07-24 LAB — CBC WITH DIFFERENTIAL/PLATELET
Abs Immature Granulocytes: 0.09 10*3/uL — ABNORMAL HIGH (ref 0.00–0.07)
Basophils Absolute: 0 10*3/uL (ref 0.0–0.1)
Basophils Relative: 0 %
Eosinophils Absolute: 0 10*3/uL (ref 0.0–0.5)
Eosinophils Relative: 0 %
HCT: 34.9 % — ABNORMAL LOW (ref 36.0–46.0)
Hemoglobin: 12 g/dL (ref 12.0–15.0)
Immature Granulocytes: 1 %
Lymphocytes Relative: 12 %
Lymphs Abs: 1 10*3/uL (ref 0.7–4.0)
MCH: 31.2 pg (ref 26.0–34.0)
MCHC: 34.4 g/dL (ref 30.0–36.0)
MCV: 90.6 fL (ref 80.0–100.0)
Monocytes Absolute: 0.3 10*3/uL (ref 0.1–1.0)
Monocytes Relative: 4 %
Neutro Abs: 6.7 10*3/uL (ref 1.7–7.7)
Neutrophils Relative %: 83 %
Platelets: 224 10*3/uL (ref 150–400)
RBC: 3.85 MIL/uL — ABNORMAL LOW (ref 3.87–5.11)
RDW: 12.3 % (ref 11.5–15.5)
WBC: 8.1 10*3/uL (ref 4.0–10.5)
nRBC: 0 % (ref 0.0–0.2)

## 2020-07-24 LAB — COMPREHENSIVE METABOLIC PANEL
ALT: 29 U/L (ref 0–44)
AST: 30 U/L (ref 15–41)
Albumin: 3.5 g/dL (ref 3.5–5.0)
Alkaline Phosphatase: 33 U/L — ABNORMAL LOW (ref 38–126)
Anion gap: 8 (ref 5–15)
BUN: 21 mg/dL (ref 8–23)
CO2: 27 mmol/L (ref 22–32)
Calcium: 8.8 mg/dL — ABNORMAL LOW (ref 8.9–10.3)
Chloride: 103 mmol/L (ref 98–111)
Creatinine, Ser: 0.81 mg/dL (ref 0.44–1.00)
GFR, Estimated: >60 mL/min (ref >60)
Glucose, Bld: 167 mg/dL — ABNORMAL HIGH (ref 70–99)
Potassium: 4.5 mmol/L (ref 3.5–5.1)
Sodium: 138 mmol/L (ref 135–145)
Total Bilirubin: 0.6 mg/dL (ref 0.3–1.2)
Total Protein: 6.5 g/dL (ref 6.5–8.1)

## 2020-07-24 LAB — FIBRIN DERIVATIVES D-DIMER (ARMC ONLY): Fibrin derivatives D-dimer (ARMC): 427.69 ng/mL (FEU) (ref 0.00–499.00)

## 2020-07-24 LAB — MAGNESIUM: Magnesium: 2 mg/dL (ref 1.7–2.4)

## 2020-07-24 LAB — C-REACTIVE PROTEIN: CRP: 0.6 mg/dL (ref <1.0)

## 2020-07-24 LAB — FERRITIN: Ferritin: 751 ng/mL — ABNORMAL HIGH (ref 11–307)

## 2020-07-24 NOTE — Progress Notes (Signed)
PROGRESS NOTE  Kristen Simon TDV:[id number] DOB: 04/21/1951 DOA: 07/21/2020 PCP: [place] Pink, MD   LOS: 3 days   Brief narrative: As per HPI,  Kristen Simon  is a 69 y.o. Caucasian female with past medical history of COPD, hypertension, history of lymphoma status post chemotherapy, GERD and recent COVID-19 illness on 06/28/2018 2021 who was receiving monoclonal antibody  infusion at the clinic and started having dyspnea and wheezing with hypotension.  Patient has had dry cough for more than a week.  In the clinic she was noted to be tachycardic and tachypneic with hypoxia requiring 2 L of oxygen.  At baseline patient uses oxygen as needed only at bedtime.  Patient also reported chills without measured fever, loss of taste and smell, fatigue and weakness in addition to dry cough, dyspnea and wheezing with nausea.. In the ED, patient was hypotensive with blood pressure of 73/42.  Patient received IV fluid hydration with improvement in blood pressure initially. pulse oximetry in the ED was 99% on 3 L of O2 by nasal cannula and later 93 to 94% on 1 L.  Labs revealed VBG with pH 7.35 and HCO3 of 25.9 and CMP was unremarkable.  High-sensitivity troponin I was 4 and later the same and procalcitonin less than 0.1.  CBC was within normal.  Chest x-ray showed no acute cardiopulmonary disease.The patient was given nebulized albuterol as well as IV Benadryl subcu epi, 20 mg of IV Pepcid and 125 mg of IV Solu-Medrol as well as duo nebs 500 mL IV lactated Ringer bolus and 2 g of IV magnesium sulfate and was admitted to the hospital for further evaluation and treatment.   Assessment/Plan:  Principal Problem:   Anaphylaxis Active Problems:   History of cancer chemotherapy   Benign essential HTN   Mixed hyperlipidemia   Pneumonia due to COVID-19 virus   Respiratory failure, acute (HCC)  Suspected anaphylactic reaction to monoclonal antibody infusion. Resolved at this time.  Received supportive  care with improvement.  Acute hypoxemic respiratory failure secondary to COVID-19.   Chest x-ray showed multifocal pneumonia.  Patient was getting monoclonal antibody as outpatient when she had a reaction.  Continue oxygen and supportive care.  Continue antibiotics, IV Solu-Medrol.   Patient has been weaned off oxygen and is currently on room air.   History of COPD, currently with acute hypoxic respiratory failure.  Improving.    Continue incentive spirometry.  Currently on room air.  Multifocal pneumonia secondary to COVID-19.   Mild elevated procalcitonin.   on Rocephin and Zithromax.  Continue supportive care including bronchodilators, follow inflammatory biomarkers.  Continue remdesivir, IV steroids.  Continue vitamin D3, vitamin C, zinc sulfate, p.o. Pepcid and aspirin.  COVID-19 Labs  Recent Labs    07/21/20 2129 07/21/20 2129 07/22/20 0606 07/23/20 0434 07/24/20 0410 07/24/20 0418  FERRITIN 526*   < > 596* 806* 751*  --   LDH 161  --   --   --   --   --   CRP 0.6   < > 2.0* 0.9  --  0.6   < > = values in this interval not displayed.    No results found for: SARSCOV2NAA D-dimer was elevated at 629.  No results found for: [place]    Essential hypertension. Continue Avapro.  Closely monitor blood pressure.   DVT prophylaxis: enoxaparin (LOVENOX) injection 40 mg Start: 07/21/20 2200   Code Status: Full code  Family Communication: I again spoke with the patient's husband Mr. Elta Guadeloupe today,  who is also hospitalized in the hospital.  Status is: Inpatient  Remains inpatient appropriate because:IV treatments appropriate due to intensity of illness or inability to take PO, Inpatient level of care appropriate due to severity of illness and Multifocal pneumonia secondary to Covid requiring oxygen supplementation, anaphylaxis to monoclonal antibody   Dispo: The patient is from: Home              Anticipated d/c is to: Home              Anticipated d/c date is: 1 to 2 days,  likely tomorrow if she continues to improve.  Patient is lives with her husband who is also hospitalized.              Patient currently is not medically stable to d/c.  Consultants:  None  Procedures:  None  Antibiotics:  . Remdesivir 11/24>  Subjective: Today, patient was seen and examined at bedside.  Patient denies any chest pain, fever, chills or rigor.  Feels not completely well with fatigue and weakness with headache.  States that her husband has been admitted to the hospital and is concerned about it.  She has nobody at home at this time.  Objective: Vitals:   07/24/20 0734 07/24/20 1245  BP: (!) 161/68 (!) 155/74  Pulse: 74 69  Resp: 16 16  Temp: 99.2 F (37.3 C) 99.6 F (37.6 C)  SpO2: 97% 94%    Intake/Output Summary (Last 24 hours) at 07/24/2020 1258 Last data filed at 07/24/2020 0553 Gross per 24 hour  Intake 240 ml  Output 2400 ml  Net -2160 ml   Filed Weights   07/22/20 0429 07/23/20 0252 07/24/20 0301  Weight: 74 kg 74 kg 73.3 kg   Body mass index is 29.54 kg/m.   Physical Exam: GENERAL: Patient is alert awake and oriented. Not in obvious distress.  On nasal cannula oxygen at 2 L/min HENT: No scleral pallor or icterus. Pupils equally reactive to light. Oral mucosa is moist NECK: is supple, no gross swelling noted. CHEST: Coarse breath sounds noted, diminished breath sounds bilaterally. CVS: S1 and S2 heard, no murmur. Regular rate and rhythm.  ABDOMEN: Soft, non-tender, bowel sounds are present. EXTREMITIES: No edema. CNS: Cranial nerves are intact. No focal motor deficits. SKIN: warm and dry without rashes.  Data Review: I have personally reviewed the following laboratory data and studies,  CBC: Recent Labs  Lab 07/21/20 1724 07/22/20 0606 07/23/20 0434 07/24/20 0410  WBC 5.2 3.4* 7.1 8.1  NEUTROABS 2.9 2.3 5.6 6.7  HGB 13.1 11.7* 11.5* 12.0  HCT 38.4 33.9* 32.8* 34.9*  MCV 90.8 90.4 88.6 90.6  PLT 230 169 193 161   Basic  Metabolic Panel: Recent Labs  Lab 07/21/20 1724 07/22/20 0606 07/23/20 0434 07/24/20 0410  NA 137 137 137 138  K 4.0 4.5 4.5 4.5  CL 101 103 105 103  CO2 25 24 25 27   GLUCOSE 124* 183* 155* 167*  BUN 16 16 20 21   CREATININE 0.92 0.95 0.69 0.81  CALCIUM 9.1 8.4* 8.4* 8.8*  MG 1.5* 2.0 1.9 2.0   Liver Function Tests: Recent Labs  Lab 07/21/20 1724 07/22/20 0606 07/23/20 0434 07/24/20 0410  AST 27 27 31 30   ALT 25 24 26 29   ALKPHOS 41 35* 33* 33*  BILITOT 0.6 0.7 0.4 0.6  PROT 7.3 6.4* 6.3* 6.5  ALBUMIN 4.1 3.4* 3.4* 3.5   No results for input(s): LIPASE, AMYLASE in the last 168 hours. No results  for input(s): AMMONIA in the last 168 hours. Cardiac Enzymes: No results for input(s): CKTOTAL, CKMB, CKMBINDEX, TROPONINI in the last 168 hours. BNP (last 3 results) Recent Labs    07/21/20 2129  BNP 25.4    ProBNP (last 3 results) No results for input(s): PROBNP in the last 8760 hours.  CBG: No results for input(s): GLUCAP in the last 168 hours. No results found for this or any previous visit (from the past 240 hour(s)).   Studies: No results found.    Flora Lipps, MD  [facility] 07/24/2020

## 2020-07-25 DIAGNOSIS — U071 COVID-19: Secondary | ICD-10-CM | POA: Diagnosis not present

## 2020-07-25 DIAGNOSIS — T782XXA Anaphylactic shock, unspecified, initial encounter: Secondary | ICD-10-CM | POA: Diagnosis not present

## 2020-07-25 DIAGNOSIS — Z9221 Personal history of antineoplastic chemotherapy: Secondary | ICD-10-CM | POA: Diagnosis not present

## 2020-07-25 DIAGNOSIS — I10 Essential (primary) hypertension: Secondary | ICD-10-CM | POA: Diagnosis not present

## 2020-07-25 LAB — CBC WITH DIFFERENTIAL/PLATELET
Abs Immature Granulocytes: 0.15 10*3/uL — ABNORMAL HIGH (ref 0.00–0.07)
Basophils Absolute: 0 10*3/uL (ref 0.0–0.1)
Basophils Relative: 0 %
Eosinophils Absolute: 0 10*3/uL (ref 0.0–0.5)
Eosinophils Relative: 0 %
HCT: 35.7 % — ABNORMAL LOW (ref 36.0–46.0)
Hemoglobin: 12.2 g/dL (ref 12.0–15.0)
Immature Granulocytes: 2 %
Lymphocytes Relative: 14 %
Lymphs Abs: 0.9 10*3/uL (ref 0.7–4.0)
MCH: 30.6 pg (ref 26.0–34.0)
MCHC: 34.2 g/dL (ref 30.0–36.0)
MCV: 89.5 fL (ref 80.0–100.0)
Monocytes Absolute: 0.3 10*3/uL (ref 0.1–1.0)
Monocytes Relative: 5 %
Neutro Abs: 4.8 10*3/uL (ref 1.7–7.7)
Neutrophils Relative %: 79 %
Platelets: 231 10*3/uL (ref 150–400)
RBC: 3.99 MIL/uL (ref 3.87–5.11)
RDW: 12.2 % (ref 11.5–15.5)
WBC: 6.2 10*3/uL (ref 4.0–10.5)
nRBC: 0 % (ref 0.0–0.2)

## 2020-07-25 LAB — COMPREHENSIVE METABOLIC PANEL
ALT: 25 U/L (ref 0–44)
AST: 23 U/L (ref 15–41)
Albumin: 3.5 g/dL (ref 3.5–5.0)
Alkaline Phosphatase: 32 U/L — ABNORMAL LOW (ref 38–126)
Anion gap: 10 (ref 5–15)
BUN: 23 mg/dL (ref 8–23)
CO2: 23 mmol/L (ref 22–32)
Calcium: 8.8 mg/dL — ABNORMAL LOW (ref 8.9–10.3)
Chloride: 103 mmol/L (ref 98–111)
Creatinine, Ser: 0.74 mg/dL (ref 0.44–1.00)
GFR, Estimated: >60 mL/min (ref >60)
Glucose, Bld: 169 mg/dL — ABNORMAL HIGH (ref 70–99)
Potassium: 4 mmol/L (ref 3.5–5.1)
Sodium: 136 mmol/L (ref 135–145)
Total Bilirubin: 0.8 mg/dL (ref 0.3–1.2)
Total Protein: 6.4 g/dL — ABNORMAL LOW (ref 6.5–8.1)

## 2020-07-25 LAB — FIBRIN DERIVATIVES D-DIMER (ARMC ONLY): Fibrin derivatives D-dimer (ARMC): 342.96 ng/mL (FEU) (ref 0.00–499.00)

## 2020-07-25 LAB — C-REACTIVE PROTEIN: CRP: 0.5 mg/dL (ref <1.0)

## 2020-07-25 LAB — FERRITIN: Ferritin: 735 ng/mL — ABNORMAL HIGH (ref 11–307)

## 2020-07-25 LAB — MAGNESIUM: Magnesium: 2 mg/dL (ref 1.7–2.4)

## 2020-07-25 MED ORDER — PREDNISONE 20 MG PO TABS: 40.0000 mg | ORAL_TABLET | Freq: Every day | ORAL | 0 refills | Status: AC

## 2020-07-25 MED ORDER — GUAIFENESIN-DM 100-10 MG/5ML PO SYRP: 10.0000 mL | ORAL_SOLUTION | ORAL | 0 refills | Status: DC | PRN

## 2020-07-25 MED ORDER — FAMOTIDINE 20 MG PO TABS: 20.0000 mg | ORAL_TABLET | Freq: Two times a day (BID) | ORAL | 0 refills | Status: AC

## 2020-07-25 NOTE — Progress Notes (Signed)
Kristen Simon to be D/C'd Home per MD order.  Discussed prescriptions and follow up appointments with the patient. Prescriptions electronically submitted, medication list explained in detail. Pt verbalized understanding.  Allergies as of 07/25/2020      Reactions   Casirivimab-imdevimab Anaphylaxis   Amlodipine Other (See Comments)   Muscle cramps   Citalopram Other (See Comments)   Felt like in a tunnel   Gabapentin Other (See Comments)   Felt like in a tunnel   Pregabalin Other (See Comments)   Felt like in a tunnel   Statins    Cramping, hives   Tiotropium Other (See Comments)      Medication List    STOP taking these medications   guaiFENesin 600 MG 12 hr tablet Commonly known as: MUCINEX     TAKE these medications   albuterol 108 (90 Base) MCG/ACT inhaler Commonly known as: VENTOLIN HFA Inhale into the lungs every 6 (six) hours as needed for wheezing or shortness of breath.   aspirin 81 MG tablet Take 81 mg by mouth daily.   budesonide-formoterol 80-4.5 MCG/ACT inhaler Commonly known as: SYMBICORT Inhale 2 puffs into the lungs 2 (two) times daily.   Caltrate 600+D Plus Minerals 600-800 MG-UNIT Chew Chew 1 tablet 3 (three) times a week by mouth.   carvedilol 25 MG tablet Commonly known as: COREG Take 25 mg by mouth 2 (two) times daily.   Cholecalciferol 125 MCG (5000 UT) capsule Take 5,000 Units by mouth daily.   [employer] Probiotic Acidophilus 10 MG Caps Take 1 tablet by mouth daily.   famotidine 20 MG tablet Commonly known as: PEPCID Take 1 tablet (20 mg total) by mouth 2 (two) times daily.   fluticasone 50 MCG/ACT nasal spray Commonly known as: FLONASE Place 2 sprays into the nose daily.   guaiFENesin-dextromethorphan 100-10 MG/5ML syrup Commonly known as: ROBITUSSIN DM Take 10 mLs by mouth every 4 (four) hours as needed for cough.   loratadine 10 MG dissolvable tablet Commonly known as: CLARITIN REDITABS Take 10 mg by mouth daily.   meclizine  25 MG tablet Commonly known as: ANTIVERT Take by mouth.   montelukast 10 MG tablet Commonly known as: SINGULAIR Take 10 mg by mouth at bedtime.   predniSONE 20 MG tablet Commonly known as: DELTASONE Take 2 tablets (40 mg total) by mouth daily with breakfast for 7 days.   RA Fish Oil 1000 MG Caps Take 1 capsule by mouth daily.   senna-docusate 8.6-50 MG tablet Commonly known as: Senokot-S Take 1 tablet by mouth at bedtime as needed.   spironolactone 25 MG tablet Commonly known as: ALDACTONE Take 25 mg by mouth daily.   telmisartan 80 MG tablet Commonly known as: MICARDIS Take by mouth.   TYLENOL ARTHRITIS PAIN PO Take 650 mg 3 (three) times daily by mouth.   vitamin E 180 MG (400 UNITS) capsule Take by mouth.       Vitals:   07/25/20 1320 07/25/20 1324  BP:    Pulse:    Resp:    Temp:    SpO2: (!) 89% 95%    Skin clean, dry and intact without evidence of skin break down, no evidence of skin tears noted. IV catheter discontinued intact. Site without signs and symptoms of complications. Dressing and pressure applied. Pt denies pain at this time. No complaints noted.  An After Visit Summary was printed and given to the patient. Patient escorted via [place], and D/C home via private auto.  [place]

## 2020-07-25 NOTE — Discharge Summary (Signed)
Physician Discharge Summary  GERTURDE KUBA GEX:[id number] DOB: 1951-05-07 DOA: 07/21/2020  PCP: [place] Pink, MD  Admit date: 07/21/2020 Discharge date: 07/25/2020  Admitted From: Home  Discharge disposition: Home   Recommendations for Outpatient Follow-Up:   . Follow up with your primary care provider in one week.  . Check CBC, BMP, magnesium in the next visit  Discharge Diagnosis:   Principal Problem:   Anaphylaxis Active Problems:   History of cancer chemotherapy   Benign essential HTN   Mixed hyperlipidemia   Pneumonia due to COVID-19 virus   Respiratory failure, acute ([place])   Discharge Condition: Improved.  Diet recommendation: Low sodium, heart healthy.   Wound care: None.  Code status: Full.  History of Present Illness:   Kristen Simon  is a 69 y.o. Caucasian female with past medical history of COPD, hypertension, history of lymphoma status post chemotherapy, GERD and recent COVID-19 illness on 06/28/2018 2021 who was receiving monoclonal antibody  infusion at the clinic and started having dyspnea and wheezing with hypotension.  Patient has had dry cough for more than a week.  In the clinic she was noted to be tachycardic and tachypneic with hypoxia requiring 2 L of oxygen.  At baseline patient uses oxygen as needed only at bedtime.  Patient also reported chills without measured fever, loss of taste and smell, fatigue and weakness in addition to dry cough, dyspnea and wheezing with nausea.. In the ED, patient was hypotensive with blood pressure of 73/42.  Patient received IV fluid hydration with improvement in blood pressure initially. pulse oximetry in the ED was 99% on 3 L of O2 by nasal cannula and later 93 to 94% on 1 L.  Labs revealed VBG with pH 7.35 and HCO3 of 25.9 and CMP was unremarkable.  High-sensitivity troponin I was 4 and later the same and procalcitonin less than 0.1.  CBC was within normal.  Chest x-ray showed no acute cardiopulmonary  disease.The patient was given nebulized albuterol as well as IV Benadryl subcu epi, 20 mg of IV Pepcid and 125 mg of IV Solu-Medrol as well as duo nebs 500 mL IV lactated Ringer bolus and 2 g of IV magnesium sulfate and was admitted to the hospital for further evaluation and treatment.    Hospital Course:   Following conditions were addressed during hospitalization as listed below,  Suspected anaphylactic reaction to monoclonal antibody infusion. Resolved at this time.  Received supportive care with improvement.  Hypotension improved with supportive care including IV fluids, Solu-Medrol and Pepcid.   Acute hypoxemic respiratory failure secondary to multifocal COVID-19 pneumonia. On presentation.  Has resolved at this time. Chest x-ray showed multifocal pneumonia.  Patient was getting monoclonal antibody as outpatient when she had an allergic reaction.   Received IV steroids and supportive care during hospitalization.  Patient has been weaned off oxygen and is currently on room air for the last 1 day.  Continue p.o. prednisone on discharge including inhalers.  We will continue Pepcid since the patient will be on steroids.   History of COPD, currently with acute hypoxic respiratory failure secondary to pneumonia.  Improved.     Currently on room air.  Patient was advised to continue inhalers at home.  Advised against overexertion.   Disposition.  At this time, patient is stable for disposition home. Patient to follow-up with her primary care physician in 1 week.  Medical Consultants:    None.  Procedures:    None Subjective:   Today, patient was seen  and examined at bedside.  Feels well.  Denies any chest pain, shortness of breath, cough or chills.  Has been off oxygen since yesterday.  Discharge Exam:   Vitals:   07/25/20 1320 07/25/20 1324  BP:    Pulse:    Resp:    Temp:    SpO2: (!) 89% 95%   Vitals:   07/25/20 1135 07/25/20 1136 07/25/20 1320 07/25/20 1324  BP:  (!)  148/70    Pulse:  66    Resp:  20    Temp: 98.4 F (36.9 C)     TempSrc: Oral     SpO2:  98% (!) 89% 95%  Weight:      Height:       General: Alert awake, not in obvious distress, HENT: pupils equally reacting to light,  No scleral pallor or icterus noted. Oral mucosa is moist.  Chest:   Diminished breath sounds bilaterally.  CVS: S1 &S2 heard. No murmur.  Regular rate and rhythm. Abdomen: Soft, nontender, nondistended.  Bowel sounds are heard.   Extremities: No cyanosis, clubbing or edema.  Peripheral pulses are palpable. Psych: Alert, awake and oriented, normal mood CNS:  No cranial nerve deficits.  Power equal in all extremities.   Skin: Warm and dry.  No rashes noted.  The results of significant diagnostics from this hospitalization (including imaging, microbiology, ancillary and laboratory) are listed below for reference.     Diagnostic Studies:   DG Chest 1 View  Result Date: 07/21/2020 CLINICAL DATA:  Shortness of breath, COVID positive. EXAM: CHEST  1 VIEW COMPARISON:  Chest x-ray 11/10/2018, CT chest 05/06/2020. FINDINGS: The heart size and mediastinal contours are within normal limits. No focal consolidation. No pulmonary edema. No pleural effusion. No pneumothorax. No acute osseous abnormality. IMPRESSION: No active disease. Electronically Signed   By: Iven Finn M.D.   On: 07/21/2020 18:24     Labs:   Basic Metabolic Panel: Recent Labs  Lab 07/21/20 1724 07/21/20 1724 07/22/20 0606 07/22/20 0606 07/23/20 0434 07/23/20 0434 07/24/20 0410 07/25/20 0550  NA 137  --  137  --  137  --  138 136  K 4.0   < > 4.5   < > 4.5   < > 4.5 4.0  CL 101  --  103  --  105  --  103 103  CO2 25  --  24  --  25  --  27 23  GLUCOSE 124*  --  183*  --  155*  --  167* 169*  BUN 16  --  16  --  20  --  21 23  CREATININE 0.92  --  0.95  --  0.69  --  0.81 0.74  CALCIUM 9.1  --  8.4*  --  8.4*  --  8.8* 8.8*  MG 1.5*  --  2.0  --  1.9  --  2.0 2.0   < > = values in this  interval not displayed.   GFR Estimated Creatinine Clearance: 60.3 mL/min (by C-G formula based on SCr of 0.74 mg/dL). Liver Function Tests: Recent Labs  Lab 07/21/20 1724 07/22/20 0606 07/23/20 0434 07/24/20 0410 07/25/20 0550  AST 27 27 31 30 23   ALT 25 24 26 29 25   ALKPHOS 41 35* 33* 33* 32*  BILITOT 0.6 0.7 0.4 0.6 0.8  PROT 7.3 6.4* 6.3* 6.5 6.4*  ALBUMIN 4.1 3.4* 3.4* 3.5 3.5   No results for input(s): LIPASE, AMYLASE in the last 168  hours. No results for input(s): AMMONIA in the last 168 hours. Coagulation profile No results for input(s): INR, PROTIME in the last 168 hours.  CBC: Recent Labs  Lab 07/21/20 1724 07/22/20 0606 07/23/20 0434 07/24/20 0410 07/25/20 0550  WBC 5.2 3.4* 7.1 8.1 6.2  NEUTROABS 2.9 2.3 5.6 6.7 4.8  HGB 13.1 11.7* 11.5* 12.0 12.2  HCT 38.4 33.9* 32.8* 34.9* 35.7*  MCV 90.8 90.4 88.6 90.6 89.5  PLT 230 169 193 224 231   Cardiac Enzymes: No results for input(s): CKTOTAL, CKMB, CKMBINDEX, TROPONINI in the last 168 hours. BNP: Invalid input(s): POCBNP CBG: No results for input(s): GLUCAP in the last 168 hours. D-Dimer No results for input(s): DDIMER in the last 72 hours. Hgb A1c No results for input(s): HGBA1C in the last 72 hours. Lipid Profile No results for input(s): CHOL, HDL, LDLCALC, TRIG, CHOLHDL, LDLDIRECT in the last 72 hours. Thyroid function studies No results for input(s): TSH, T4TOTAL, T3FREE, THYROIDAB in the last 72 hours.  Invalid input(s): FREET3 Anemia work up Recent Labs    07/24/20 0410 07/25/20 0550  FERRITIN 751* 735*   Microbiology No results found for this or any previous visit (from the past 240 hour(s)).   Discharge Instructions:   Discharge Instructions     Diet - low sodium heart healthy   Complete by: As directed    Discharge instructions   Complete by: As directed    Follow-up with your primary care physician in 1 week.  Take prednisone as prescribed.  No overexertion. Seek medical  attention for worsening symptoms. Total isolation duration 21 days from day of diagnosis.  Continue to use albuterol inhaler and Symbicort at home.   Increase activity slowly   Complete by: As directed       Allergies as of 07/25/2020       Reactions   Casirivimab-imdevimab Anaphylaxis   Amlodipine Other (See Comments)   Muscle cramps   Citalopram Other (See Comments)   Felt like in a tunnel   Gabapentin Other (See Comments)   Felt like in a tunnel   Pregabalin Other (See Comments)   Felt like in a tunnel   Statins    Cramping, hives   Tiotropium Other (See Comments)        Medication List     STOP taking these medications    guaiFENesin 600 MG 12 hr tablet Commonly known as: MUCINEX       TAKE these medications    albuterol 108 (90 Base) MCG/ACT inhaler Commonly known as: VENTOLIN HFA Inhale into the lungs every 6 (six) hours as needed for wheezing or shortness of breath.   aspirin 81 MG tablet Take 81 mg by mouth daily.   budesonide-formoterol 80-4.5 MCG/ACT inhaler Commonly known as: SYMBICORT Inhale 2 puffs into the lungs 2 (two) times daily.   Caltrate 600+D Plus Minerals 600-800 MG-UNIT Chew Chew 1 tablet 3 (three) times a week by mouth.   carvedilol 25 MG tablet Commonly known as: COREG Take 25 mg by mouth 2 (two) times daily.   Cholecalciferol 125 MCG (5000 UT) capsule Take 5,000 Units by mouth daily.   [employer] Probiotic Acidophilus 10 MG Caps Take 1 tablet by mouth daily.   famotidine 20 MG tablet Commonly known as: PEPCID Take 1 tablet (20 mg total) by mouth 2 (two) times daily.   fluticasone 50 MCG/ACT nasal spray Commonly known as: FLONASE Place 2 sprays into the nose daily.   guaiFENesin-dextromethorphan 100-10 MG/5ML syrup Commonly known as: ROBITUSSIN  DM Take 10 mLs by mouth every 4 (four) hours as needed for cough.   loratadine 10 MG dissolvable tablet Commonly known as: CLARITIN REDITABS Take 10 mg by mouth daily.   meclizine  25 MG tablet Commonly known as: ANTIVERT Take by mouth.   montelukast 10 MG tablet Commonly known as: SINGULAIR Take 10 mg by mouth at bedtime.   predniSONE 20 MG tablet Commonly known as: DELTASONE Take 2 tablets (40 mg total) by mouth daily with breakfast for 7 days.   RA Fish Oil 1000 MG Caps Take 1 capsule by mouth daily.   senna-docusate 8.6-50 MG tablet Commonly known as: Senokot-S Take 1 tablet by mouth at bedtime as needed.   spironolactone 25 MG tablet Commonly known as: ALDACTONE Take 25 mg by mouth daily.   telmisartan 80 MG tablet Commonly known as: MICARDIS Take by mouth.   TYLENOL ARTHRITIS PAIN PO Take 650 mg 3 (three) times daily by mouth.   vitamin E 180 MG (400 UNITS) capsule Take by mouth.           Time coordinating discharge: 39 minutes  Signed:  Alexias Margerum  [facility] 07/25/2020, 1:27 PM

## 2020-07-25 NOTE — TOC Transition Note (Signed)
Transition of Care [facility]) - CM/SW Discharge Note   Patient Details  Name: Kristen Simon MRN: [id number] Date of Birth: 1951/03/07  [facility] CM/SW Contact:  Izola Price, RN Phone Number: 07/25/2020, 1:56 PM   Clinical Narrative:   11/28 DC to home/self care orders written. No HH or DME orders. Simmie Davies RN CM    Final next level of care: Home/Self Care Barriers to Discharge: Barriers Resolved   Patient Goals and CMS Choice        Discharge Placement                       Discharge Plan and Services   Discharge Planning Services: CM Consult            DME Arranged: N/A DME Agency: NA       HH Arranged: NA          Social Determinants of Health (SDOH) Interventions     Readmission Risk Interventions Readmission Risk Prevention Plan 07/23/2020  Transportation Screening Complete  Social Work Consult for [place] Planning/Counseling [place] Not Applicable  Medication Review [employer]) Complete  Some recent data might be hidden

## 2020-07-25 NOTE — Progress Notes (Signed)
Patient ambulated around the room on RA. Gait steady, no SOB. Patient states "I just feel a little week" sat 96% prior to ambulation, after walking in the room sat did drop to 89% for a short time, however recovered back up to 95% quickly. Dr. Louanne Belton was make aware, per md patient is okay for discharged, however limit activity.

## 2020-09-20 ENCOUNTER — Ambulatory Visit
Admission: RE | Admit: 2020-09-20 | Discharge: 2020-09-20 | Disposition: A | Discharge status: Home / Self Care | Payer: Medicare HMO | Source: Ambulatory Visit | Attending: Family Medicine | Admitting: Family Medicine

## 2020-09-20 ENCOUNTER — Other Ambulatory Visit: Payer: Self-pay

## 2020-09-20 DIAGNOSIS — Z1231 Encounter for screening mammogram for malignant neoplasm of breast: Secondary | ICD-10-CM | POA: Insufficient documentation

## 2020-09-30 ENCOUNTER — Encounter: Payer: 59 | Admitting: Dermatology

## 2020-10-29 ENCOUNTER — Emergency Department: Payer: Medicare HMO

## 2020-10-29 ENCOUNTER — Encounter: Payer: Self-pay | Admitting: Emergency Medicine

## 2020-10-29 ENCOUNTER — Emergency Department
Admission: EM | Admit: 2020-10-29 | Discharge: 2020-10-29 | Disposition: A | Discharge status: Home / Self Care | Payer: Medicare HMO | Attending: Emergency Medicine | Admitting: Emergency Medicine

## 2020-10-29 ENCOUNTER — Other Ambulatory Visit: Payer: Self-pay

## 2020-10-29 DIAGNOSIS — Z7982 Long term (current) use of aspirin: Secondary | ICD-10-CM | POA: Diagnosis not present

## 2020-10-29 DIAGNOSIS — Z7951 Long term (current) use of inhaled steroids: Secondary | ICD-10-CM | POA: Insufficient documentation

## 2020-10-29 DIAGNOSIS — Z8616 Personal history of COVID-19: Secondary | ICD-10-CM | POA: Diagnosis not present

## 2020-10-29 DIAGNOSIS — I10 Essential (primary) hypertension: Secondary | ICD-10-CM | POA: Insufficient documentation

## 2020-10-29 DIAGNOSIS — J441 Chronic obstructive pulmonary disease with (acute) exacerbation: Secondary | ICD-10-CM | POA: Diagnosis not present

## 2020-10-29 DIAGNOSIS — Z8541 Personal history of malignant neoplasm of cervix uteri: Secondary | ICD-10-CM | POA: Diagnosis not present

## 2020-10-29 DIAGNOSIS — Z87891 Personal history of nicotine dependence: Secondary | ICD-10-CM | POA: Insufficient documentation

## 2020-10-29 DIAGNOSIS — R0602 Shortness of breath: Secondary | ICD-10-CM | POA: Diagnosis present

## 2020-10-29 DIAGNOSIS — Z79899 Other long term (current) drug therapy: Secondary | ICD-10-CM | POA: Insufficient documentation

## 2020-10-29 LAB — CBC WITH DIFFERENTIAL/PLATELET
Abs Immature Granulocytes: 0.03 10*3/uL (ref 0.00–0.07)
Basophils Absolute: 0 10*3/uL (ref 0.0–0.1)
Basophils Relative: 1 %
Eosinophils Absolute: 0.1 10*3/uL (ref 0.0–0.5)
Eosinophils Relative: 2 %
HCT: 35.6 % — ABNORMAL LOW (ref 36.0–46.0)
Hemoglobin: 11.8 g/dL — ABNORMAL LOW (ref 12.0–15.0)
Immature Granulocytes: 1 %
Lymphocytes Relative: 21 %
Lymphs Abs: 1 10*3/uL (ref 0.7–4.0)
MCH: 30.7 pg (ref 26.0–34.0)
MCHC: 33.1 g/dL (ref 30.0–36.0)
MCV: 92.7 fL (ref 80.0–100.0)
Monocytes Absolute: 0.6 10*3/uL (ref 0.1–1.0)
Monocytes Relative: 12 %
Neutro Abs: 3.1 10*3/uL (ref 1.7–7.7)
Neutrophils Relative %: 63 %
Platelets: 325 10*3/uL (ref 150–400)
RBC: 3.84 MIL/uL — ABNORMAL LOW (ref 3.87–5.11)
RDW: 12.3 % (ref 11.5–15.5)
WBC: 4.9 10*3/uL (ref 4.0–10.5)
nRBC: 0 % (ref 0.0–0.2)

## 2020-10-29 LAB — COMPREHENSIVE METABOLIC PANEL
ALT: 15 U/L (ref 0–44)
AST: 20 U/L (ref 15–41)
Albumin: 4 g/dL (ref 3.5–5.0)
Alkaline Phosphatase: 55 U/L (ref 38–126)
Anion gap: 10 (ref 5–15)
BUN: 14 mg/dL (ref 8–23)
CO2: 25 mmol/L (ref 22–32)
Calcium: 9.7 mg/dL (ref 8.9–10.3)
Chloride: 104 mmol/L (ref 98–111)
Creatinine, Ser: 1.16 mg/dL — ABNORMAL HIGH (ref 0.44–1.00)
GFR, Estimated: 50 mL/min — ABNORMAL LOW (ref >60)
Glucose, Bld: 120 mg/dL — ABNORMAL HIGH (ref 70–99)
Potassium: 5.1 mmol/L (ref 3.5–5.1)
Sodium: 139 mmol/L (ref 135–145)
Total Bilirubin: 0.7 mg/dL (ref 0.3–1.2)
Total Protein: 7.6 g/dL (ref 6.5–8.1)

## 2020-10-29 LAB — TROPONIN I (HIGH SENSITIVITY): Troponin I (High Sensitivity): <2 ng/L (ref <18)

## 2020-10-29 MED ORDER — IOHEXOL 350 MG/ML SOLN
75.0000 mL | Freq: Once | INTRAVENOUS | Status: AC | PRN
  Administered 2020-10-29: 75 mL via INTRAVENOUS

## 2020-10-29 MED ORDER — ACETAMINOPHEN 325 MG PO TABS
650.0000 mg | ORAL_TABLET | Freq: Once | ORAL | Status: AC
  Administered 2020-10-29: 650 mg via ORAL
  Filled 2020-10-29: qty 2

## 2020-10-29 MED ORDER — PREDNISONE 20 MG PO TABS: 40.0000 mg | ORAL_TABLET | Freq: Every day | ORAL | 0 refills | Status: AC

## 2020-10-29 MED ORDER — SODIUM CHLORIDE 0.9 % IV BOLUS
1000.0000 mL | Freq: Once | INTRAVENOUS | Status: AC
  Administered 2020-10-29: 1000 mL via INTRAVENOUS

## 2020-10-29 MED ORDER — ALBUTEROL SULFATE (2.5 MG/3ML) 0.083% IN NEBU: 2.5000 mg | INHALATION_SOLUTION | Freq: Once | RESPIRATORY_TRACT | Status: DC

## 2020-10-29 MED ORDER — ALBUTEROL SULFATE (2.5 MG/3ML) 0.083% IN NEBU
2.5000 mg | INHALATION_SOLUTION | Freq: Once | RESPIRATORY_TRACT | Status: AC
  Administered 2020-10-29: 2.5 mg via RESPIRATORY_TRACT
  Filled 2020-10-29: qty 3

## 2020-10-29 MED ORDER — ACETAMINOPHEN 500 MG PO TABS: 1000.0000 mg | ORAL_TABLET | Freq: Once | ORAL | Status: DC

## 2020-10-29 NOTE — ED Notes (Addendum)
Pt assisted up to bedside toilet. Steady. Pt urinated. Pt has COPD history. While walking down hallway with this RN pt desat to 90% at lowest and started wheezing slightly. Recovered quickly once sitting down on stretcher. Visitor states pt has become increasingly SOB at home and will sometimes gets SOB while at rest. Pt currently 98% RA. Resp reg/unlabored. EDP [place] notified via secure chat.

## 2020-10-29 NOTE — ED Provider Notes (Signed)
[facility] Emergency Department Provider Note  ____________________________________________   Event Date/Time   First MD Initiated Contact with Patient 10/29/20 1400     (approximate)  I have reviewed the triage vital signs and the nursing notes.   HISTORY  Chief Complaint Shortness of Breath    HPI Kristen Simon is a 70 y.o. female with prior lymphoma, anemia, COPD who no longer smokes who comes in for shortness of breath.  Patient reports being positive for COVID back in November where she was admitted to the hospital requiring oxygen.  Since discharge she has had some shortness of breath although it seemed of gotten worse over the past few days.  Patient was [facility] on [facility].  Patient was diagnosed with pneumonia on the left and started on Levaquin.  Patient was given a dose of methyl prednisolone.  Patient states that since then her shortness of breath has continued to get worse, worse with exertion, better at rest.  Patient reports that her heart rates have been going high as well with ambulation.  No new abdominal pain.  Patient does report a cough.  Patient had a negative flu and Covid test as well.       Past Medical History:  Diagnosis Date  . Allergy   . Anemia   . Breast screening, unspecified   . COPD (chronic obstructive pulmonary disease) ([place])   . Dysphagia   . Elevated cholesterol   . Fatty liver   . GERD (gastroesophageal reflux disease)   . Groin mass   . History of cancer chemotherapy 2013  . History of rectal bleeding   . Hx of radiation therapy 2013  . Hypertension 2010  . Internal hemorrhoids with other complication 3845  . Lymphoma ([place])   . Personal history of chemotherapy   . Personal history of radiation therapy   . Personal history of tobacco use, presenting hazards to health   . 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    lymphoma 2013  . Ulcer 1970    stomach-resolved  . Unilateral or unspecified femoral hernia with obstruction     Patient Active Problem List   Diagnosis Date Noted  . Pneumonia due to COVID-19 virus 07/22/2020  . Respiratory failure, acute ([place]) 07/22/2020  . Anaphylaxis 07/21/2020  . Long term current use of opiate analgesic 03/19/2017  . Long term prescription opiate use 03/19/2017  . Opiate use 03/19/2017  . Chronic pain syndrome 03/19/2017  . Chronic low back pain, unspecified back pain laterality, with sciatica presence unspecified (primary) (bilateral) (R>L) 03/19/2017  . Chronic neck pain 03/19/2017  . Pain in both lower extremities (tertiary)(R>L) 03/19/2017  . Bilateral groin pain (secondary) (R>L) 03/19/2017  . Sacroiliac joint pain 03/19/2017  . Primary cervical cancer ([place]) 12/29/2016  . Personal history of tobacco use, presenting hazards to health 01/13/2016  . Benign essential HTN 01/29/2015  . Mixed hyperlipidemia 01/29/2015  . Shortness of breath 01/29/2015  . Cough 01/02/2014  . Secondary malignant neoplasm of lymph nodes of inguinal region or lower extremity ([place])   . History of cancer chemotherapy     Past Surgical History:  Procedure Laterality Date  . ANOSCOPY  03/27/2012   anal biopsy/hemorrhoid-benign  . APPENDECTOMY  1970  . COLONOSCOPY WITH PROPOFOL N/A 09/30/2018   Procedure: COLONOSCOPY WITH PROPOFOL;  Surgeon: Manya Silvas, MD;  Location: [facility] ENDOSCOPY;  Service: Endoscopy;  Laterality: N/A;  . ESOPHAGOGASTRODUODENOSCOPY N/A  09/30/2018   Procedure: ESOPHAGOGASTRODUODENOSCOPY (EGD);  Surgeon: Manya Silvas, MD;  Location: [facility] ENDOSCOPY;  Service: Endoscopy;  Laterality: N/A;  . FRACTURE SURGERY  [id number]   right wrist,right leg  . GROIN MASS OPEN BIOPSY Right 01/24/2012   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  . HERNIA REPAIR Right 01/10/2012   right femerol hernia  . PATELLA FRACTURE SURGERY Right 1982  . WRIST SURGERY Right      Prior to Admission medications   Medication Sig Start Date End Date Taking? Authorizing Provider  Acetaminophen (TYLENOL ARTHRITIS PAIN PO) Take 650 mg 3 (three) times daily by mouth.    [provider]  albuterol (PROVENTIL HFA;VENTOLIN HFA) 108 (90 BASE) MCG/ACT inhaler Inhale into the lungs every 6 (six) hours as needed for wheezing or shortness of breath.    [provider]  aspirin 81 MG tablet Take 81 mg by mouth daily. Patient not taking: Reported on 07/22/2020    [provider]  budesonide-formoterol (SYMBICORT) 80-4.5 MCG/ACT inhaler Inhale 2 puffs into the lungs 2 (two) times daily.    [provider]  Calcium Carbonate-Vit D-Min (CALTRATE 600+D PLUS MINERALS) 600-800 MG-UNIT CHEW Chew 1 tablet 3 (three) times a week by mouth.    [provider]  carvedilol (COREG) 25 MG tablet Take 25 mg by mouth 2 (two) times daily. 06/11/19   [provider]  Cholecalciferol 125 MCG (5000 UT) capsule Take 5,000 Units by mouth daily.     [provider]  famotidine (PEPCID) 20 MG tablet Take 1 tablet (20 mg total) by mouth 2 (two) times daily. 07/25/20   Pokhrel, Corrie Mckusick, MD  fluticasone (FLONASE) 50 MCG/ACT nasal spray Place 2 sprays into the nose daily. Patient not taking: Reported on 07/22/2020 04/05/15 12/22/56  [provider]  guaiFENesin-dextromethorphan (ROBITUSSIN DM) 100-10 MG/5ML syrup Take 10 mLs by mouth every 4 (four) hours as needed for cough. 07/25/20   Pokhrel, Corrie Mckusick, MD  Lactobacillus ([employer]S PROBIOTIC ACIDOPHILUS) 10 MG CAPS Take 1 tablet by mouth daily.    [provider]  loratadine (CLARITIN REDITABS) 10 MG dissolvable tablet Take 10 mg by mouth daily.    [provider]  meclizine (ANTIVERT) 25 MG tablet Take by mouth.    [provider]  montelukast (SINGULAIR) 10 MG tablet Take 10 mg by mouth at bedtime.    [provider]  Omega-3 Fatty Acids (RA FISH OIL) 1000 MG CAPS  Take 1 capsule by mouth daily.    [provider]  senna-docusate (SENOKOT-S) 8.6-50 MG tablet Take 1 tablet by mouth at bedtime as needed. Patient not taking: Reported on 07/22/2020 12/23/15   Forest Gleason, MD  spironolactone (ALDACTONE) 25 MG tablet Take 25 mg by mouth daily. 06/22/20   [provider]  telmisartan (MICARDIS) 80 MG tablet Take by mouth. 07/01/20   [provider]  vitamin E 180 MG (400 UNITS) capsule Take by mouth.    [provider]    Allergies Casirivimab-imdevimab, Amlodipine, Citalopram, Gabapentin, Pregabalin, Statins, and Tiotropium  Family History  Problem Relation Age of Onset  . Cancer Mother        lung  . Breast cancer Maternal Aunt     Social History Social History   Tobacco Use  . Smoking status: Former Smoker    Packs/day: 0.88    Years: 40.00    Pack years: 35.20    Quit date: 08/01/2015    Years since quitting: 5.2  . Smokeless  tobacco: Never Used  Vaping Use  . Vaping Use: Never used  Substance Use Topics  . Alcohol use: Yes  . Drug use: No      Review of Systems Constitutional: No fever/chills, generalized weakness Eyes: No visual changes. ENT: No sore throat. Cardiovascular: No chest pain Respiratory: Positive for SOB, cough Gastrointestinal: No abdominal pain.  No nausea, no vomiting.  No diarrhea.  No constipation. Genitourinary: Negative for dysuria. Musculoskeletal: Negative for back pain. Skin: Negative for rash. Neurological: Negative for headaches, focal weakness or numbness. All other ROS negative ____________________________________________   PHYSICAL EXAM:  VITAL SIGNS: ED Triage Vitals  Enc Vitals Group     BP 10/29/20 1233 (!) 141/63     Pulse Rate 10/29/20 1233 93     Resp 10/29/20 1233 (!) 22     Temp 10/29/20 1233 98.2 F (36.8 C)     Temp Source 10/29/20 1233 Oral     SpO2 10/29/20 1233 95 %     Weight 10/29/20 1232 160 lb (72.6 kg)     Height 10/29/20 1232 5\' 2"   (1.575 m)     Head Circumference --      Peak Flow --      Pain Score 10/29/20 1232 7     Pain Loc --      Pain Edu? --      Excl. in [place]? --     Constitutional: Alert and oriented. Well appearing and in no acute distress. Eyes: Conjunctivae are normal. EOMI. Head: Atraumatic. Nose: No congestion/rhinnorhea. Mouth/Throat: Mucous membranes are moist.   Neck: No stridor. Trachea Midline. FROM Cardiovascular: Tachycardic, regular rhythm. Grossly normal heart sounds.  Good peripheral circulation. Respiratory: Clear lungs, no increased work of breathing Gastrointestinal: Soft and nontender. No distention. No abdominal bruits.  Musculoskeletal: No lower extremity tenderness nor edema.  No joint effusions. Neurologic:  Normal speech and language. No gross focal neurologic deficits are appreciated.  Skin:  Skin is warm, dry and intact. No rash noted. Psychiatric: Mood and affect are normal. Speech and behavior are normal. GU: Deferred   ____________________________________________   LABS (all labs ordered are listed, but only abnormal results are displayed)  Labs Reviewed  CBC WITH DIFFERENTIAL/PLATELET - Abnormal; Notable for the following components:      Result Value   RBC 3.84 (*)    Hemoglobin 11.8 (*)    HCT 35.6 (*)    All other components within normal limits  COMPREHENSIVE METABOLIC PANEL - Abnormal; Notable for the following components:   Glucose, Bld 120 (*)    Creatinine, Ser 1.16 (*)    GFR, Estimated 50 (*)    All other components within normal limits  TROPONIN I (HIGH SENSITIVITY)   ____________________________________________   ED ECG REPORT I, Vanessa Steele, the attending physician, personally viewed and interpreted this ECG.  Normal sinus rate of 95, no ST elevation, no T wave inversions, normal intervals ____________________________________________  RADIOLOGY Robert Bellow, personally viewed and evaluated these images (plain radiographs) as part of my  medical decision making, as well as reviewing the written report by the radiologist.  ED MD interpretation: No pneumonia  Official radiology report(s): DG Chest 2 View  Result Date: 10/29/2020 CLINICAL DATA:  Shortness of breath.  Cough.  Pneumonia. EXAM: CHEST - 2 VIEW COMPARISON:  07/21/2020 FINDINGS: Midline trachea. Normal heart size and mediastinal contours. No pleural effusion or pneumothorax. Clear lungs. IMPRESSION: No acute cardiopulmonary disease. Electronically Signed   By: Adria Devon.D.  On: 10/29/2020 13:22   CT Angio Chest PE W and/or Wo Contrast  Result Date: 10/29/2020 CLINICAL DATA:  Short of breath, pneumonia EXAM: CT ANGIOGRAPHY CHEST WITH CONTRAST TECHNIQUE: Multidetector CT imaging of the chest was performed using the standard protocol during bolus administration of intravenous contrast. Multiplanar CT image reconstructions and MIPs were obtained to evaluate the vascular anatomy. CONTRAST:  76mL OMNIPAQUE IOHEXOL 350 MG/ML SOLN COMPARISON:  10/29/2020, 05/06/2020 FINDINGS: Cardiovascular: This is a technically adequate evaluation of the pulmonary vasculature. No filling defects or pulmonary emboli. The heart is unremarkable without pericardial effusion. Normal caliber of the thoracic aorta with no evidence of aneurysm or dissection. Minimal atherosclerosis of the aortic arch. Mediastinum/Nodes: No enlarged mediastinal, hilar, or axillary lymph nodes. Thyroid gland, trachea, and esophagus demonstrate no significant findings. Lungs/Pleura: Diffuse central lobular emphysema, without acute airspace disease, effusion, or pneumothorax. Central airways are patent. Upper Abdomen: No acute abnormality. Musculoskeletal: No acute or destructive bony lesions. Reconstructed images demonstrate no additional findings. Review of the MIP images confirms the above findings. IMPRESSION: 1. No evidence of pulmonary embolus. 2. No acute intrathoracic process. 3. Aortic Atherosclerosis (ICD10-I70.0) and  Emphysema (ICD10-J43.9). Electronically Signed   By: Randa Ngo M.D.   On: 10/29/2020 15:55    ____________________________________________   PROCEDURES  Procedure(s) performed (including Critical Care):  .1-3 Lead EKG Interpretation Performed by: Vanessa [place], MD Authorized by: Vanessa [place], MD     Interpretation: normal     ECG rate:  70s   ECG rate assessment: normal     Rhythm: sinus rhythm     Ectopy: none     Conduction: normal       ____________________________________________   INITIAL IMPRESSION / ASSESSMENT AND PLAN / ED COURSE   Kristen Simon was evaluated in Emergency Department on 10/29/2020 for the symptoms described in the history of present illness. She was evaluated in the context of the global COVID-19 pandemic, which necessitated consideration that the patient might be at risk for infection with the SARS-CoV-2 virus that causes COVID-19. Institutional protocols and algorithms that pertain to the evaluation of patients at risk for COVID-19 are in a state of rapid change based on information released by regulatory bodies including the CDC and federal and state organizations. These policies and algorithms were followed during the patient's care in the ED.     Pt presents with SOB.  Patient recently treated for pneumonia.  Will get CT PE to look for worsening pneumonia, mass, pulmonary embolism.  Will get cardiac markers to evaluate for ACS and keep patient on the cardiac monitor to evaluate for arrhythmia.  Patient already had a negative COVID and flu test.  Chest x-ray did not show any evidence of pneumonia and her CT was also negative  No evidence of significant anemia and cardiac marker was negative and symptoms have been going on for greater than 3 hours.  Her kidney function is slightly elevated at 1.16 which could be causing her heart rate to be elevated due to the low dehydration.  Patient was given 1 L of fluid.  CT PE was negative  Patient  was ambulated in the lower she dropped was 90% briefly but was able to recover quickly.  She had slight wheezing per the nurse with ambulation but when I went to reevaluate her her wheezing had stopped.  Discussed with patient that this could be secondary to the resolving pneumonia versus post COVID versus worsening COPD.  Patient has a pulmonologist that she  follows with.  We discussed admission versus discharge and patient feels comfortable going home given oxygen levels are above 90%.  We discussed doing a course of prednisone to help with inflammation and continuing the antibiotics and to follow-up with her pulmonologist.  She understands that if her shortness of breath is worsening she is to return to the ER due to her borderline oxygen levels.  Patient states that she was also instructed on how to use the albuterol nebulizer correctly.  She states that she was doing it wrong at home and she thinks that after doing the one here correctly her symptoms improved greatly.     ____________________________________________   FINAL CLINICAL IMPRESSION(S) / ED DIAGNOSES   Final diagnoses:  COPD exacerbation ([place])     MEDICATIONS GIVEN DURING THIS VISIT:  Medications  albuterol (PROVENTIL) (2.5 MG/3ML) 0.083% nebulizer solution 2.5 mg (2.5 mg Nebulization Given 10/29/20 1422)  sodium chloride 0.9 % bolus 1,000 mL (0 mLs Intravenous Stopped 10/29/20 1518)  acetaminophen (TYLENOL) tablet 650 mg (650 mg Oral Given 10/29/20 1423)  iohexol (OMNIPAQUE) 350 MG/ML injection 75 mL (75 mLs Intravenous Contrast Given 10/29/20 1541)     ED Discharge Orders         Ordered    predniSONE (DELTASONE) 20 MG tablet  Daily with breakfast        10/29/20 1710           Note:  This document was prepared using Dragon voice recognition software and may include unintentional dictation errors.   Vanessa [place], MD 10/29/20 1710

## 2020-10-29 NOTE — ED Notes (Signed)
Pt reports HA, dizziness, "back pain from coughing", and was told to come in by PCP based on lack of improvement of PNA while on antibiotics. Pt currently on RA at 95%. Visitor at bedside. Pt able to hold full conversation. A&Ox4. Resp reg/unlabored. Skin dry. Laying calmly in bed.

## 2020-10-29 NOTE — ED Triage Notes (Signed)
Pt to ED via POV, ambulatory with steady gait to triage room, pt states dx with pneumonia on 3/2 and has been abx without relief. Pt states tested negative for covid and flu on Tuesday.

## 2020-10-29 NOTE — ED Notes (Signed)
Pt laying calmly in bed. EDP [place] at bedside.

## 2020-10-29 NOTE — ED Notes (Signed)
Pt given another warm blanket. Room temp inc for pt as well.

## 2020-10-29 NOTE — ED Triage Notes (Signed)
First Nurse Note:  Arrives with c/o SOB.  Per patient, seen by PCP and diagnosed with pneumonia.  Taking Antibioitcs and Steroid, but no improvement.    Patient is AAOx3.  Skin warm and dry. NAD.  No SOB/ DOE.

## 2020-10-29 NOTE — ED Notes (Signed)
Pt wheeled out to vehicle.  

## 2020-10-29 NOTE — Discharge Instructions (Signed)
Take the steroids and use with combination of your antibiotic.  Call your pulmonologist and follow-up with them.  Continue use your albuterol treatments and return the ER if develop worsening shortness of breath

## 2020-12-02 ENCOUNTER — Encounter: Payer: Medicare HMO | Admitting: Dermatology

## 2021-01-12 ENCOUNTER — Other Ambulatory Visit: Payer: Self-pay

## 2021-01-12 ENCOUNTER — Ambulatory Visit (INDEPENDENT_AMBULATORY_CARE_PROVIDER_SITE_OTHER): Payer: Medicare HMO | Admitting: Dermatology

## 2021-01-12 DIAGNOSIS — Z1283 Encounter for screening for malignant neoplasm of skin: Secondary | ICD-10-CM | POA: Diagnosis not present

## 2021-01-12 DIAGNOSIS — Z85828 Personal history of other malignant neoplasm of skin: Secondary | ICD-10-CM

## 2021-01-12 DIAGNOSIS — Z8541 Personal history of malignant neoplasm of cervix uteri: Secondary | ICD-10-CM

## 2021-01-12 DIAGNOSIS — L650 Telogen effluvium: Secondary | ICD-10-CM | POA: Diagnosis not present

## 2021-01-12 DIAGNOSIS — L820 Inflamed seborrheic keratosis: Secondary | ICD-10-CM

## 2021-01-12 DIAGNOSIS — L821 Other seborrheic keratosis: Secondary | ICD-10-CM

## 2021-01-12 DIAGNOSIS — D1800 Hemangioma unspecified site: Secondary | ICD-10-CM

## 2021-01-12 DIAGNOSIS — L814 Other melanin hyperpigmentation: Secondary | ICD-10-CM

## 2021-01-12 DIAGNOSIS — L578 Other skin changes due to chronic exposure to nonionizing radiation: Secondary | ICD-10-CM | POA: Diagnosis not present

## 2021-01-12 DIAGNOSIS — D229 Melanocytic nevi, unspecified: Secondary | ICD-10-CM

## 2021-01-12 NOTE — Patient Instructions (Addendum)
Cryotherapy Aftercare  . Wash gently with soap and water everyday.   Marland Kitchen Apply Vaseline and Band-Aid daily until healed.   If you have any questions or concerns for your doctor, please call our main line at [phone] and press option 4 to reach your doctor's medical assistant. If no one answers, please leave a voicemail as directed and we will return your call as soon as possible. Messages left after 4 pm will be answered the following business day.   You may also send [place] a message via [place]. We typically respond to MyChart messages within 1-2 business days.  For prescription refills, please ask your pharmacy to contact our office. Our fax number is [phone].  If you have an urgent issue when the clinic is closed that cannot wait until the next business day, you can page your doctor at the number below.    Please note that while we do our best to be available for urgent issues outside of office hours, we are not available 24/7.   If you have an urgent issue and are unable to reach [place], you may choose to seek medical care at your doctor's office, retail clinic, urgent care center, or emergency room.  If you have a medical emergency, please immediately call 911 or go to the emergency department.  Pager Numbers  - Dr. Nehemiah Massed: [phone]  - Dr. Laurence Ferrari: [phone]  - Dr. Nicole Kindred: [phone]  In the event of inclement weather, please call our main line at [phone] for an update on the status of any delays or closures.  Dermatology Medication Tips: Please keep the boxes that topical medications come in in order to help keep track of the instructions about where and how to use these. Pharmacies typically print the medication instructions only on the boxes and not directly on the medication tubes.   If your medication is too expensive, please contact our office at [phone] option 4 or send [place] a message through [place].   We are unable to tell what your co-pay for  medications will be in advance as this is different depending on your insurance coverage. However, we may be able to find a substitute medication at lower cost or fill out paperwork to get insurance to cover a needed medication.   If a prior authorization is required to get your medication covered by your insurance company, please allow [place] 1-2 business days to complete this process.  Drug prices often vary depending on where the prescription is filled and some pharmacies may offer cheaper prices.  The website www.goodrx.com contains coupons for medications through different pharmacies. The prices here do not account for what the cost may be with help from insurance (it may be cheaper with your insurance), but the website can give you the price if you did not use any insurance.  - You can print the associated coupon and take it with your prescription to the pharmacy.  - You may also stop by our office during regular business hours and pick up a GoodRx coupon card.  - If you need your prescription sent electronically to a different pharmacy, notify our office through [facility] or by phone at [phone] option 4.   Recommend Biotin 2.5 mg qd for hair loss

## 2021-01-12 NOTE — Progress Notes (Signed)
Follow-Up Visit   Subjective  Kristen Simon is a 70 y.o. female who presents for the following: Annual Exam (History of BCC - TBSE today).  Accompanied by Husband who contributes to history  The patient presents for Total-Body Skin Exam (TBSE) for skin cancer screening and mole check.  The following portions of the chart were reviewed this encounter and updated as appropriate:   Tobacco  Allergies  Meds  Problems  Med Hx  Surg Hx  Fam Hx     Review of Systems:  No other skin or systemic complaints except as noted in HPI or Assessment and Plan.  Objective  Well appearing patient in no apparent distress; mood and affect are within normal limits.  A full examination was performed including scalp, head, eyes, ears, nose, lips, neck, chest, axillae, abdomen, back, buttocks, bilateral upper extremities, bilateral lower extremities, hands, feet, fingers, toes, fingernails, and toenails. All findings within normal limits unless otherwise noted below.  Objective  Scalp x 2, right upper back x 1 (3): Erythematous keratotic or waxy stuck-on papule or plaque.    Assessment & Plan    Lentigines - Scattered tan macules - Due to sun exposure - Benign-appering, observe - Recommend daily broad spectrum sunscreen SPF 30+ to sun-exposed areas, reapply every 2 hours as needed. - Call for any changes  Seborrheic Keratoses - Stuck-on, waxy, tan-brown papules and/or plaques  - Benign-appearing - Discussed benign etiology and prognosis. - Observe - Call for any changes  Melanocytic Nevi - Tan-brown and/or pink-flesh-colored symmetric macules and papules - Benign appearing on exam today - Observation - Call clinic for new or changing moles - Recommend daily use of broad spectrum spf 30+ sunscreen to sun-exposed areas.   Hemangiomas - Red papules - Discussed benign nature - Observe - Call for any changes  Actinic Damage - Chronic condition, secondary to cumulative UV/sun  exposure - diffuse scaly erythematous macules with underlying dyspigmentation - Recommend daily broad spectrum sunscreen SPF 30+ to sun-exposed areas, reapply every 2 hours as needed.  - Staying in the shade or wearing long sleeves, sun glasses (UVA+UVB protection) and wide brim hats (4-inch brim around the entire circumference of the hat) are also recommended for sun protection.  - Call for new or changing lesions.  Skin cancer screening performed today.  History of Basal Cell Carcinoma of the Skin - No evidence of recurrence today - Recommend regular full body skin exams - Recommend daily broad spectrum sunscreen SPF 30+ to sun-exposed areas, reapply every 2 hours as needed.  - Call if any new or changing lesions are noted between office visits  History of Cervical cancer treated with radiation - Radiation tattoo of left thigh.  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 It does not progress to baldness. It can be triggered by recent illness, recent surgery, thyroid disease, low iron stores, vitamin  D deficiency, fad diets or rapid weight loss, hormonal changes such as pregnancy or birth control pills, and some medication. Usually the hair loss starts 2-3 months after the illness or health change. Rarely, it can continue for longer than a  year.   Recommend Biotin qd Consider low level light treatment (red light treatment) Information given.  Skin cancer screening  Return in about 1 year (around 01/12/2022).  I, Ashok Cordia, CMA, am acting as scribe for Sarina Ser, MD .  Documentation: I have reviewed the above documentation for accuracy and completeness, and I agree with the above.  Sarina Ser, MD

## 2021-01-21 ENCOUNTER — Encounter: Payer: Self-pay | Admitting: Dermatology

## 2021-04-06 ENCOUNTER — Telehealth: Payer: Self-pay

## 2021-04-06 NOTE — Telephone Encounter (Signed)
Called and spoke with Kristen Simon. She has history of cervical cancer and was released from gyn oncology in 2019. Last seen for gyn care by Dr. Leonides Schanz in 2021. Recent visit to PCP for feeling of something in her vagina. Per report she has a uterine prolapse. She has requested to be seen since Dr. Leonides Schanz is no longer at [facility]. Appointment arranged for 8/17.

## 2021-04-13 ENCOUNTER — Inpatient Hospital Stay: Payer: Medicare HMO | Attending: Obstetrics and Gynecology | Admitting: Obstetrics and Gynecology

## 2021-04-13 VITALS — BP 138/66 | HR 84 | Temp 98.7°F | Resp 20 | Wt 165.2 lb

## 2021-04-13 DIAGNOSIS — Z79891 Long term (current) use of opiate analgesic: Secondary | ICD-10-CM | POA: Diagnosis not present

## 2021-04-13 DIAGNOSIS — R109 Unspecified abdominal pain: Secondary | ICD-10-CM | POA: Insufficient documentation

## 2021-04-13 DIAGNOSIS — M549 Dorsalgia, unspecified: Secondary | ICD-10-CM | POA: Insufficient documentation

## 2021-04-13 DIAGNOSIS — Z7982 Long term (current) use of aspirin: Secondary | ICD-10-CM | POA: Insufficient documentation

## 2021-04-13 DIAGNOSIS — E782 Mixed hyperlipidemia: Secondary | ICD-10-CM | POA: Insufficient documentation

## 2021-04-13 DIAGNOSIS — G894 Chronic pain syndrome: Secondary | ICD-10-CM | POA: Diagnosis not present

## 2021-04-13 DIAGNOSIS — R110 Nausea: Secondary | ICD-10-CM | POA: Diagnosis not present

## 2021-04-13 DIAGNOSIS — Z8541 Personal history of malignant neoplasm of cervix uteri: Secondary | ICD-10-CM | POA: Insufficient documentation

## 2021-04-13 DIAGNOSIS — Z9221 Personal history of antineoplastic chemotherapy: Secondary | ICD-10-CM | POA: Insufficient documentation

## 2021-04-13 DIAGNOSIS — I10 Essential (primary) hypertension: Secondary | ICD-10-CM | POA: Insufficient documentation

## 2021-04-13 DIAGNOSIS — Z79899 Other long term (current) drug therapy: Secondary | ICD-10-CM | POA: Diagnosis not present

## 2021-04-13 DIAGNOSIS — Z85828 Personal history of other malignant neoplasm of skin: Secondary | ICD-10-CM | POA: Diagnosis not present

## 2021-04-13 DIAGNOSIS — C774 Secondary and unspecified malignant neoplasm of inguinal and lower limb lymph nodes: Secondary | ICD-10-CM

## 2021-04-13 DIAGNOSIS — M542 Cervicalgia: Secondary | ICD-10-CM | POA: Insufficient documentation

## 2021-04-13 DIAGNOSIS — Z923 Personal history of irradiation: Secondary | ICD-10-CM | POA: Diagnosis not present

## 2021-04-13 DIAGNOSIS — N814 Uterovaginal prolapse, unspecified: Secondary | ICD-10-CM

## 2021-04-13 DIAGNOSIS — N8110 Cystocele, unspecified: Secondary | ICD-10-CM | POA: Diagnosis not present

## 2021-04-13 NOTE — Progress Notes (Signed)
Gynecologic Oncology Interval Visit   Referring Provider: Referred by Dr. Wende Bushy. Rogue Bussing   Chief Concern: History of squamous carcinoma noted in a R inguinal node, presumed cervical cancer.   Subjective:  Kristen Simon is a 70 y.o. female who is seen for continued surveillance for squamous carcinoma noted in a R inguinal node, presumed cervical cancer.    She was released from the clinic in 2019 and was seeing Dr. Leonides Schanz who recently left practice. She reports symptoms consistent with uterine prolapse and requested this appointment. She has a h/o chronic pain but has noticed increased more severe pain located in the lower pelvic areas and associated with nausea over the past week. Last Pap 10/24/2017 NILM/HPV negative.   CT Angio Chest 10/29/2020 IMPRESSION: 1. No evidence of pulmonary embolus. 2. No acute intrathoracic process. 3. Aortic Atherosclerosis (ICD10-I70.0) and Emphysema (ICD10-J43.9).   Gynecologic Oncology  History Kristen Simon is a very pleasant patient with a history of squamous carcinoma noted in a R inguinal node, presumed metastatic cervical cancer.   12/2011        squamous carcinoma noted in a R inguinal node,CT /  Pet scan without definite primary,              anal exam WNL, pelvic exam normal, but incomplete due to discomfort, ECC neg,   tissue evaluation c/w/ cervical primary,                   chemo-XRT, completed NED 05/2012  Dr. Sabra Heck in August 2015 negative exam and negative Pap smear.   Dr. Oliva Bustard March 2016 for a follow-up CT scan that was negative for evidence of recurrent disease.  10/21/2014 CT scan Abdomen and pelvis IMPRESSION: 1. No evidence of recurrent or metastatic disease. 2. Question mild hepatic steatosis. 3. Suspect a 2.5 cm uterine fibroid.  October 2016:Dr. Choksi for a follow-up and had a negative exam including breast exam. Her mammogram was negative.   12/08/2015 Pap NILM; HPV negative  12/17/2015 CT  scan IMPRESSION: No evidence of metastatic disease.  Uterine fibroid. Hepatic steatosis.  01/14/2016: Kristen Nim NP  smoking cessation and lung cancer screening reviewed. She has quit smoking.   01/14/2016 CT Chest Lung Cancer Screening IMPRESSION: 1. Lung-Rads category 3, probably benign findings. Short-term follow-up in 6 months is recommended with repeat low-dose chest CT without contrast (please use the following order, "CT CHEST LCS NODULE FOLLOW-UP W/O CM"). 2. Mild diffuse bronchial wall thickening with mild centrilobular and paraseptal emphysema; imaging findings suggestive of underlying COPD. 3. Hepatic steatosis.  01/09/2017 PET scan IMPRESSION: 1. No findings of active malignancy in the neck, chest, abdomen, or pelvis. No definite hypermetabolic cervical mass is readily apparent. 2. Emphysema. 3. Old granulomatous disease. 4. Diffuse hepatic steatosis.  She has been NED and was released from clinic in 2019.     Problem List: Patient Active Problem List   Diagnosis Date Noted   Pneumonia due to COVID-19 virus 07/22/2020   Respiratory failure, acute ([place]) 07/22/2020   Anaphylaxis 07/21/2020   Long term current use of opiate analgesic 03/19/2017   Long term prescription opiate use 03/19/2017   Opiate use 03/19/2017   Chronic pain syndrome 03/19/2017   Chronic low back pain, unspecified back pain laterality, with sciatica presence unspecified (primary) (bilateral) (R>L) 03/19/2017   Chronic neck pain 03/19/2017   Pain in both lower extremities (tertiary)(R>L) 03/19/2017   Bilateral groin pain (secondary) (R>L) 03/19/2017   Sacroiliac joint pain 03/19/2017   Primary  cervical cancer ([place]) 12/29/2016   Personal history of tobacco use, presenting hazards to health 01/13/2016   Benign essential HTN 01/29/2015   Mixed hyperlipidemia 01/29/2015   Shortness of breath 01/29/2015   Cough 01/02/2014   Secondary malignant neoplasm of lymph nodes of inguinal region or lower  extremity ([place])    History of cancer chemotherapy     Past Medical History: Past Medical History:  Diagnosis Date   Allergy    Anemia    Breast screening, unspecified    COPD (chronic obstructive pulmonary disease) ([place])    Dysphagia    Elevated cholesterol    Fatty liver    GERD (gastroesophageal reflux disease)    Groin mass    History of basal cell carcinoma (BCC) 01/24/2017   right cheek zygoma   History of cancer chemotherapy 2013   History of rectal bleeding    Hx of radiation therapy 2013   Hypertension 2010   Internal hemorrhoids with other complication 3154   Lymphoma ([place])    Personal history of chemotherapy    Personal history of radiation therapy    Personal history of tobacco use, presenting hazards to health    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    lymphoma 2013   Ulcer 1970   stomach-resolved   Unilateral or unspecified femoral hernia with obstruction     Past Surgical History: Past Surgical History:  Procedure Laterality Date   ANOSCOPY  03/27/2012   anal biopsy/hemorrhoid-benign   APPENDECTOMY  1970   COLONOSCOPY WITH PROPOFOL N/A 09/30/2018   Procedure: COLONOSCOPY WITH PROPOFOL;  Surgeon: Manya Silvas, MD;  Location: [facility] ENDOSCOPY;  Service: Endoscopy;  Laterality: N/A;   ESOPHAGOGASTRODUODENOSCOPY N/A 09/30/2018   Procedure: ESOPHAGOGASTRODUODENOSCOPY (EGD);  Surgeon: Manya Silvas, MD;  [facility] ENDOSCOPY;  Service: Endoscopy;  Laterality: N/A;   FRACTURE SURGERY  [phone]   right wrist,right leg   GROIN MASS OPEN BIOPSY Right 01/24/2012   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   HERNIA REPAIR Right 01/10/2012   right femerol hernia   PATELLA FRACTURE SURGERY Right 1982   WRIST SURGERY Right     Past Gynecologic History:   Gravida 1   Para 1   Age at Menarche 73   Age at Menopause 57   Regular Pap Smears Yes   Additional Hx  patient does not recall any gyn problems in the past    OB History:  OB History  Obstetric Comments  Age with first menstruation-9  LMP-age 87    Family History: Family History  Problem Relation Age of Onset   Cancer Mother        lung   Breast cancer Maternal Aunt     Social History: Social History   Socioeconomic History   Marital status: Married    Spouse name: Not on file   Number of children: Not on file   Years of education: Not on file   Highest education level: Not on file  Occupational History   Not on file  Tobacco Use   Smoking status: Former    Packs/day: 0.88    Years: 40.00    Pack years: 35.20    Types: Cigarettes    Quit date: 08/01/2015    Years since quitting: 5.7   Smokeless tobacco: Never  Vaping Use   Vaping Use: Never used  Substance and Sexual Activity   Alcohol use: Yes   Drug use: No  Sexual activity: Never  Other Topics Concern   Not on file  Social History Narrative   Not on file   Social Determinants of Health   Financial Resource Strain: Not on file  Food Insecurity: Not on file  Transportation Needs: Not on file  Physical Activity: Not on file  Stress: Not on file  Social Connections: Not on file  Intimate Partner Violence: Not on file    Allergies: Allergies  Allergen Reactions   Casirivimab-Imdevimab Anaphylaxis   Amlodipine Other (See Comments)    Muscle cramps   Citalopram Other (See Comments)    Felt like in a tunnel   Gabapentin Other (See Comments)    Felt like in a tunnel   Pregabalin Other (See Comments)    Felt like in a tunnel   Statins     Cramping, hives   Tiotropium Other (See Comments)    Current Medications: Current Outpatient Medications  Medication Sig Dispense Refill   Acetaminophen (TYLENOL ARTHRITIS PAIN PO) Take 650 mg 3 (three) times daily by mouth.     albuterol (PROVENTIL HFA;VENTOLIN HFA) 108 (90 BASE) MCG/ACT inhaler Inhale into the lungs every 6 (six) hours as needed for  wheezing or shortness of breath.     aspirin 81 MG tablet Take 81 mg by mouth daily.     budesonide-formoterol (SYMBICORT) 80-4.5 MCG/ACT inhaler Inhale 2 puffs into the lungs 2 (two) times daily.     Calcium Carbonate-Vit D-Min (CALTRATE 600+D PLUS MINERALS) 600-800 MG-UNIT CHEW Chew 1 tablet 3 (three) times a week by mouth.     carvedilol (COREG) 25 MG tablet Take 25 mg by mouth 2 (two) times daily.     Cholecalciferol 125 MCG (5000 UT) capsule Take 5,000 Units by mouth daily.      famotidine (PEPCID) 20 MG tablet Take 1 tablet (20 mg total) by mouth 2 (two) times daily. 30 tablet 0   fluticasone (FLONASE) 50 MCG/ACT nasal spray Place 2 sprays into the nose daily.     guaiFENesin-dextromethorphan (ROBITUSSIN DM) 100-10 MG/5ML syrup Take 10 mLs by mouth every 4 (four) hours as needed for cough. 236 mL 0   Lactobacillus ([employer] PROBIOTIC ACIDOPHILUS) 10 MG CAPS Take 1 tablet by mouth daily.     loratadine (CLARITIN REDITABS) 10 MG dissolvable tablet Take 10 mg by mouth daily.     meclizine (ANTIVERT) 25 MG tablet Take by mouth.     montelukast (SINGULAIR) 10 MG tablet Take 10 mg by mouth at bedtime.     Omega-3 Fatty Acids (RA FISH OIL) 1000 MG CAPS Take 1 capsule by mouth daily.     senna-docusate (SENOKOT-S) 8.6-50 MG tablet Take 1 tablet by mouth at bedtime as needed. 30 tablet 6   spironolactone (ALDACTONE) 25 MG tablet Take 25 mg by mouth daily.     telmisartan (MICARDIS) 80 MG tablet Take by mouth.     vitamin E 180 MG (400 UNITS) capsule Take by mouth.     No current facility-administered medications for this visit.    Review of Systems   General: changes in weight or appetite (weight gain)  HEENT: no complaints  Lungs: dyspnea o/w no complaints  Cardiac: palpitations o/w no complaints  GI: no complaints  GU: urinary frequency recently assess for UTI and prolapse symptoms o/w no complaints  Musculoskeletal: arm/neck/shoulder pain scheduled to see a provider tomorrow for evaluation  o/w no complaints  Extremities: no complaints  Skin: no complaints  Neuro: headaches o/w no complaints  Endocrine: no  complaints  Psych: no complaints       Objective:  Physical Examination:  BP 138/66   Pulse 84   Temp 98.7 F (37.1 C)   Resp 20   Wt 165 lb 3.2 oz (74.9 kg)   SpO2 100%   BMI 30.22 kg/m    Body mass index is 30.22 kg/m.  ECOG PS: 1   GENERAL: Patient is a well appearing female in no acute distress NODES:  Palpable firm right inguinal node, no left inguinal lymphadenopathy palpated.  ABDOMEN:  Soft, rotund protruding abdomen with tenderness to palpation in BLQ.  No masses or hernias or ascites  EXTREMITIES:  No peripheral edema.   NEURO:  Nonfocal. Well oriented.  Appropriate affect.  Pelvic: EGBUS: no lesions Bladder/urethra: normal urethra; bladder prolapse to the hymenal ring with valsalva. There is not significant descent of the cervix.  Cervix: no lesions, nontender, stenotic os Vagina: no lesions, no discharge or bleeding; p Uterus: not palpable or grossly enlarged Adnexa: no palpable masses, but on BME very tender to palpation more than expected. Parametria smooth.  Rectovaginal: confirmatory; no rectocele   Lab Review Labs on site today:  Ref Range & Units 7 d ago  Color Yellow, Violet, Light Violet, Dark Violet Yellow   Clarity Clear, Other Clear   Specific Gravity 1.000 - 1.030 <=1.005   pH, Urine 5.0 - 8.0 6.0   Protein, Urinalysis Negative, Trace mg/dL Negative   Glucose, Urinalysis Negative mg/dL Negative   Ketones, Urinalysis Negative mg/dL Negative   Blood, Urinalysis Negative Negative   Nitrite, Urinalysis Negative Negative   Leukocyte Esterase, Urinalysis Negative Negative   White Blood Cells, Urinalysis None Seen, 0-3 /hpf None Seen   Red Blood Cells, Urinalysis None Seen, 0-3 /hpf None Seen   Bacteria, Urinalysis None Seen /hpf None Seen   Squamous Epithelial Cells, Urinalysis Rare, Few, None Seen /hpf None Seen    Lab  Results  Component Value Date   WBC 4.9 10/29/2020   HGB 11.8 (L) 10/29/2020   HCT 35.6 (L) 10/29/2020   MCV 92.7 10/29/2020   PLT 325 10/29/2020     Chemistry      Component Value Date/Time   NA 139 10/29/2020 1234   NA 140 03/19/2017 1131   NA 137 12/09/2014 1114   K 5.1 10/29/2020 1234   K 3.7 12/09/2014 1114   CL 104 10/29/2020 1234   CL 103 12/09/2014 1114   CO2 25 10/29/2020 1234   CO2 27 12/09/2014 1114   BUN 14 10/29/2020 1234   BUN 9 03/19/2017 1131   BUN 10 12/09/2014 1114   CREATININE 1.16 (H) 10/29/2020 1234   CREATININE 0.62 12/09/2014 1114      Component Value Date/Time   CALCIUM 9.7 10/29/2020 1234   CALCIUM 9.3 12/09/2014 1114   ALKPHOS 55 10/29/2020 1234   ALKPHOS 47 12/09/2014 1114   AST 20 10/29/2020 1234   AST 31 12/09/2014 1114   ALT 15 10/29/2020 1234   ALT 35 12/09/2014 1114   BILITOT 0.7 10/29/2020 1234   BILITOT 0.3 03/19/2017 1131   BILITOT 0.6 12/09/2014 1114      Ref Range & Units 3 mo ago  WBC (White Blood Cell Count) 4.1 - 10.2 10^3/uL 5.1   RBC (Red Blood Cell Count) 4.04 - 5.48 10^6/uL 4.20   Hemoglobin 12.0 - 15.0 gm/dL 12.8   Hematocrit 35.0 - 47.0 % 38.0   MCV (Mean Corpuscular Volume) 80.0 - 100.0 fl 90.5   MCH (Mean  Corpuscular Hemoglobin) 27.0 - 31.2 pg 30.5   MCHC (Mean Corpuscular Hemoglobin Concentration) 32.0 - 36.0 gm/dL 33.7   Platelet Count 150 - 450 10^3/uL 269   RDW-CV (Red Cell Distribution Width) 11.6 - 14.8 % 12.3   MPV (Mean Platelet Volume) 9.4 - 12.4 fl 9.0 Low    Neutrophils 1.50 - 7.80 10^3/uL 2.50   Lymphocytes 1.00 - 3.60 10^3/uL 2.10   Mixed Count 0.10 - 0.90 10^3/uL 0.50   Neutrophil % 32.0 - 70.0 % 49.6   Lymphocyte % 10.0 - 50.0 % 40.2   Mixed % 3.0 - 14.4 % 10.2    Ref Range & Units 3 mo ago   Glucose 70 - 110 mg/dL 97   Sodium 136 - 145 mmol/L 140   Potassium 3.6 - 5.1 mmol/L 5.1   Chloride 97 - 109 mmol/L 107   Carbon Dioxide (CO2) 22.0 - 32.0 mmol/L 28.1   Urea Nitrogen (BUN) 7 - 25 mg/dL  16   Creatinine 0.6 - 1.1 mg/dL 0.8   Glomerular Filtration Rate (eGFR), MDRD Estimate >60 mL/min/1.73sq m 71   Calcium 8.7 - 10.3 mg/dL 9.8   AST  8 - 39 U/L 15   ALT  5 - 38 U/L 15   Alk Phos (alkaline Phosphatase) 34 - 104 U/L 40   Albumin 3.5 - 4.8 g/dL 4.3   Bilirubin, Total 0.3 - 1.2 mg/dL 0.4   Protein, Total 6.1 - 7.9 g/dL 6.4   A/G Ratio 1.0 - 5.0 gm/dL 2.0    Radiologic Imaging: As noted above    Assessment:  Kristen Simon is a 70 y.o. female diagnosed with  squamous carcinoma noted in a R inguinal node, presumed cervical cancer s/p  Chemotherapy and radiation, clinically NED, however exam and symptoms worrisome for recurrence.  History of chronic abdominal pain but newer symptoms of worsening acute abdominal pain/nausea, and significant tenderness on pelvic exam is concerning.    Pelvic organ prolapse with cystocele.   Lung-Rads category 3. Medical co-morbidities complicating care:  history of tobacco use, now stopped.  Overweight.  Plan:   Imaging and laboratory assessment is based on clinical indication. And based on today's exam and her symptoms plan for PET/CT (no allergies to contract and creatinine normal)  Prior Pap normal. We previously discussed the questionable utility of Pap given the uncertainty whether the cervix was even the primary site of disease and limited utility of Pap for cervical cancer surveillance. We previously discussed that after 5 years we can probably extend Pap intervals to 3-5 years if we continue. Typically Pap smears are followed for 20 years post diagnosis of cervical cancer.   Referral to Urology in [place]. Given her h/o or prior radiation and malignancy they may wish for her to have a Urogynecology referral at [facility].   If PET/CT negative suggested return to clinic in one year. She is now over 5 years out from therapy without evidence of recurrence and was previously released from our clinic. However, she prefers to be seen here.    The patient's diagnosis, an outline of the further diagnostic and laboratory studies which will be required, the recommendation, and alternatives were discussed.  All questions were answered to the patient's satisfaction.  A total of at least 45 minutes were spent with the patient/family today; >50% was spent in education, counseling and coordination of care for h/o squamous carcinoma noted in a R inguinal node, presumed cervical cancer s/p  Chemotherapy and radiation with symptoms concerning for  recurrence and pelvic organ prolapse.  [place]in[place]re Alexa Gaetana Michaelis, MD

## 2021-04-13 NOTE — Progress Notes (Signed)
Patient states she can barely stand, walk or sit for lon periods of time. Patient states it feels as if she is laying an egg. Patient states it feels as if her pelvic area is on fire fire majority of the time.

## 2021-04-19 ENCOUNTER — Other Ambulatory Visit: Payer: Self-pay

## 2021-04-19 ENCOUNTER — Ambulatory Visit
Admission: RE | Admit: 2021-04-19 | Discharge: 2021-04-19 | Disposition: A | Discharge status: Home / Self Care | Payer: Medicare HMO | Source: Ambulatory Visit | Attending: Obstetrics and Gynecology | Admitting: Obstetrics and Gynecology

## 2021-04-19 DIAGNOSIS — C774 Secondary and unspecified malignant neoplasm of inguinal and lower limb lymph nodes: Secondary | ICD-10-CM | POA: Insufficient documentation

## 2021-04-19 LAB — GLUCOSE, CAPILLARY: Glucose-Capillary: 112 mg/dL — ABNORMAL HIGH (ref 70–99)

## 2021-04-19 MED ORDER — FLUDEOXYGLUCOSE F - 18 (FDG) INJECTION
8.6000 | Freq: Once | INTRAVENOUS | Status: AC | PRN
  Administered 2021-04-19: 8.49 via INTRAVENOUS

## 2021-04-21 ENCOUNTER — Telehealth: Payer: Self-pay

## 2021-04-21 NOTE — Telephone Encounter (Signed)
Called Kristen Simon with her PET scan results. Per Dr. Gershon Crane note: If PET/CT negative suggested return to clinic in one year. She is now over 5 years out from therapy without evidence of recurrence and was previously released from our clinic. However, she prefers to be seen here. One year follow up made and appointment along with a copy of her PET results have been mailed tp her home address.

## 2021-05-23 ENCOUNTER — Other Ambulatory Visit: Payer: Self-pay

## 2021-05-23 ENCOUNTER — Encounter: Payer: Self-pay | Admitting: Urology

## 2021-05-23 ENCOUNTER — Ambulatory Visit (INDEPENDENT_AMBULATORY_CARE_PROVIDER_SITE_OTHER): Payer: Medicare HMO | Admitting: Urology

## 2021-05-23 VITALS — BP 154/77 | HR 89 | Ht 62.0 in | Wt 165.0 lb

## 2021-05-23 DIAGNOSIS — N3946 Mixed incontinence: Secondary | ICD-10-CM | POA: Diagnosis not present

## 2021-05-23 DIAGNOSIS — N8110 Cystocele, unspecified: Secondary | ICD-10-CM

## 2021-05-23 LAB — MICROSCOPIC EXAMINATION: Bacteria, UA: NONE SEEN

## 2021-05-23 LAB — URINALYSIS, COMPLETE
Bilirubin, UA: NEGATIVE
Glucose, UA: NEGATIVE
Ketones, UA: NEGATIVE
Leukocytes,UA: NEGATIVE
Nitrite, UA: NEGATIVE
Protein,UA: NEGATIVE
RBC, UA: NEGATIVE
Specific Gravity, UA: 1.015 (ref 1.005–1.030)
Urobilinogen, Ur: 0.2 mg/dL (ref 0.2–1.0)
pH, UA: 5.5 (ref 5.0–7.5)

## 2021-05-23 MED ORDER — ESTRADIOL 0.1 MG/GM VA CREA: TOPICAL_CREAM | VAGINAL | 12 refills | Status: AC

## 2021-05-23 NOTE — Progress Notes (Signed)
05/23/2021 3:05 PM   Kristen Simon 1951-07-15 [id number]  Referring provider: Gillis Ends, MD [facility] [place],  [place] [address]  Chief Complaint  Patient presents with   female bladder prolapse    HPI: Was consulted to assess the patient for prolapse.  She feels vaginal bulging.  If she sits or stands too long she feels burning.  She does have some vaginal pain she does not report direct vaginal dryness.  She can sometimes push it up with splinting maneuver with bowel movement  She rarely leaks with urgency.  She rarely leaks with sneezing.  She can leak with laughing.  She wears 2 small pads a day that are damp.  Flow was good.  No bedwetting.  Has not had a hysterectomy.  She had a CT scan October 2021 demonstrating normal kidneys and no hydronephrosis  Patient is not sexually active  No history of kidney stones bladder surgery or bladder infections.  No neurologic issues.   PMH: Past Medical History:  Diagnosis Date   Allergy    Anemia    Breast screening, unspecified    COPD (chronic obstructive pulmonary disease) (HCC)    Dysphagia    Elevated cholesterol    Fatty liver    GERD (gastroesophageal reflux disease)    Groin mass    History of basal cell carcinoma (BCC) 01/24/2017   right cheek zygoma   History of cancer chemotherapy 2013   History of rectal bleeding    Hx of radiation therapy 2013   Hypertension 2010   Internal hemorrhoids with other complication 8588   Lymphoma ([place])    Personal history of chemotherapy    Personal history of radiation therapy    Personal history of tobacco use, presenting hazards to health    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    lymphoma 2013   Ulcer 1970   stomach-resolved   Unilateral or unspecified femoral hernia with obstruction     Surgical History: Past Surgical History:  Procedure Laterality Date   ANOSCOPY   03/27/2012   anal biopsy/hemorrhoid-benign   APPENDECTOMY  1970   COLONOSCOPY WITH PROPOFOL N/A 09/30/2018   Procedure: COLONOSCOPY WITH PROPOFOL;  Surgeon: Manya Silvas, MD;  Location: [facility] ENDOSCOPY;  Service: Endoscopy;  Laterality: N/A;   ESOPHAGOGASTRODUODENOSCOPY N/A 09/30/2018   Procedure: ESOPHAGOGASTRODUODENOSCOPY (EGD);  Surgeon: Manya Silvas, MD;  Location: [facility] ENDOSCOPY;  Service: Endoscopy;  Laterality: N/A;   FRACTURE SURGERY  [phone]   right wrist,right leg   GROIN MASS OPEN BIOPSY Right 01/24/2012   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   HERNIA REPAIR Right 01/10/2012   right femerol hernia   PATELLA FRACTURE SURGERY Right 1982   WRIST SURGERY Right     Home Medications:  Allergies as of 05/23/2021       Reactions   Casirivimab-imdevimab Anaphylaxis   Amlodipine Other (See Comments)   Muscle cramps   Citalopram Other (See Comments)   Felt like in a tunnel   Gabapentin Other (See Comments)   Felt like in a tunnel   Pregabalin Other (See Comments)   Felt like in a tunnel   Statins    Cramping, hives   Tiotropium Other (See Comments)        Medication List        Accurate as of May 23, 2021  3:05 PM. If you have any questions, ask your  nurse or doctor.          acetaminophen-codeine 300-30 MG tablet Commonly known as: TYLENOL #3 Take 1 tablet by mouth every 6 (six) hours as needed.   albuterol 108 (90 Base) MCG/ACT inhaler Commonly known as: VENTOLIN HFA Inhale into the lungs every 6 (six) hours as needed for wheezing or shortness of breath.   aspirin 81 MG tablet Take 81 mg by mouth daily.   baclofen 10 MG tablet Commonly known as: LIORESAL Take 1 tablet by mouth 2 (two) times daily as needed.   budesonide-formoterol 80-4.5 MCG/ACT inhaler Commonly known as: SYMBICORT Inhale 2 puffs into the lungs 2 (two) times daily.   Caltrate 600+D Plus Minerals 600-800 MG-UNIT Chew Chew 1 tablet 3  (three) times a week by mouth.   carvedilol 25 MG tablet Commonly known as: COREG Take 25 mg by mouth 2 (two) times daily.   Cholecalciferol 125 MCG (5000 UT) capsule Take 5,000 Units by mouth daily.   [employer] Probiotic Acidophilus 10 MG Caps Take 1 tablet by mouth daily.   famotidine 20 MG tablet Commonly known as: PEPCID Take 1 tablet (20 mg total) by mouth 2 (two) times daily.   fluticasone 50 MCG/ACT nasal spray Commonly known as: FLONASE Place 2 sprays into the nose daily.   guaiFENesin-dextromethorphan 100-10 MG/5ML syrup Commonly known as: ROBITUSSIN DM Take 10 mLs by mouth every 4 (four) hours as needed for cough.   loratadine 10 MG dissolvable tablet Commonly known as: CLARITIN REDITABS Take 10 mg by mouth daily.   meclizine 25 MG tablet Commonly known as: ANTIVERT Take by mouth.   montelukast 10 MG tablet Commonly known as: SINGULAIR Take 10 mg by mouth at bedtime.   RA Fish Oil 1000 MG Caps Take 1 capsule by mouth daily.   senna-docusate 8.6-50 MG tablet Commonly known as: Senokot-S Take 1 tablet by mouth at bedtime as needed.   spironolactone 25 MG tablet Commonly known as: ALDACTONE Take 25 mg by mouth daily.   telmisartan 80 MG tablet Commonly known as: MICARDIS Take by mouth.   traMADol 50 MG tablet Commonly known as: ULTRAM Take 50 mg by mouth 2 (two) times daily as needed.   TYLENOL ARTHRITIS PAIN PO Take 650 mg 3 (three) times daily by mouth.   vitamin E 180 MG (400 UNITS) capsule Take by mouth.   zinc gluconate 50 MG tablet Take 50 mg by mouth daily.        Allergies:  Allergies  Allergen Reactions   Casirivimab-Imdevimab Anaphylaxis   Amlodipine Other (See Comments)    Muscle cramps   Citalopram Other (See Comments)    Felt like in a tunnel   Gabapentin Other (See Comments)    Felt like in a tunnel   Pregabalin Other (See Comments)    Felt like in a tunnel   Statins     Cramping, hives   Tiotropium Other (See Comments)     Family History: Family History  Problem Relation Age of Onset   Cancer Mother        lung   Breast cancer Maternal Aunt     Social History:  reports that she quit smoking about 5 years ago. Her smoking use included cigarettes. She has a 35.20 pack-year smoking history. She has never used smokeless tobacco. She reports that she does not currently use alcohol. She reports that she does not use drugs.  ROS:  Physical Exam: There were no vitals taken for this visit.  Constitutional:  Alert and oriented, No acute distress. HEENT: [place] AT, moist mucus membranes.  Trachea midline, no masses. Cardiovascular: No clubbing, cyanosis, or edema. Respiratory: Normal respiratory effort, no increased work of breathing. GI: Abdomen is soft, nontender, nondistended, no abdominal masses GU: Moderate grade 3 cystocele with central defect.  Modest vaginal atrophy.  Mild hypermobility the bladder neck and no stress incontinence.  Uterus descended from 8 or 9 cm to approximately 5 cm.  Very small distal rectocele grade 1 Skin: No rashes, bruises or suspicious lesions. Lymph: No cervical or inguinal adenopathy. Neurologic: Grossly intact, no focal deficits, moving all 4 extremities. Psychiatric: Normal mood and affect.  Laboratory Data: Lab Results  Component Value Date   WBC 4.9 10/29/2020   HGB 11.8 (L) 10/29/2020   HCT 35.6 (L) 10/29/2020   MCV 92.7 10/29/2020   PLT 325 10/29/2020    Lab Results  Component Value Date   CREATININE 1.16 (H) 10/29/2020    No results found for: PSA  No results found for: TESTOSTERONE  No results found for: HGBA1C  Urinalysis    Component Value Date/Time   COLORURINE YELLOW (A) 07/22/2020 0605   APPEARANCEUR HAZY (A) 07/22/2020 0605   APPEARANCEUR Hazy 02/05/2012 1035   LABSPEC 1.023 07/22/2020 0605   LABSPEC 1.018 02/05/2012 1035   PHURINE 5.0 07/22/2020 0605   GLUCOSEU >=500 (A)  07/22/2020 0605   GLUCOSEU Negative 02/05/2012 1035   HGBUR NEGATIVE 07/22/2020 0605   BILIRUBINUR NEGATIVE 07/22/2020 0605   BILIRUBINUR Negative 02/05/2012 [place] 07/22/2020 0605   PROTEINUR NEGATIVE 07/22/2020 0605   NITRITE NEGATIVE 07/22/2020 0605   LEUKOCYTESUR NEGATIVE 07/22/2020 0605   LEUKOCYTESUR Negative 02/05/2012 1035    Pertinent Imaging: Urine reviewed.  Urine sent for culture.  Chart reviewed  Assessment & Plan: Patient has prolapse symptoms.  She has mild rare incontinence with frequency and nocturia.  Picture was drawn.  If she ever had surgery she could be treated with a transvaginal hysterectomy with vault suspension and cystocele repair and graft.  Because she still has a uterus robotic hysterectomy and vault prolapse repair would be an excellent choice.  Role of urodynamics discussed.  Vaginal estrogen cream prior to surgery would potentially help any soreness or dryness issues.  We spent several minutes discussing goals and her discomfort.  We gave her estrogen 3 nights a week for 1 month and then once weekly and order urodynamics.  I will ask about her pain when she comes back.  She understands that eventually she would likely have a robotic procedure in [place]  1. Female bladder prolapse  - Urinalysis, Complete - CULTURE, URINE COMPREHENSIVE   No follow-ups on file.  Reece Packer, MD  [place] [address] [place], [place] [address] [phone]

## 2021-05-27 LAB — CULTURE, URINE COMPREHENSIVE

## 2021-06-13 ENCOUNTER — Other Ambulatory Visit: Payer: Self-pay

## 2021-06-13 ENCOUNTER — Other Ambulatory Visit: Payer: Self-pay | Admitting: Urology

## 2021-06-13 ENCOUNTER — Ambulatory Visit (INDEPENDENT_AMBULATORY_CARE_PROVIDER_SITE_OTHER): Payer: Medicare HMO | Admitting: Urology

## 2021-06-13 ENCOUNTER — Encounter: Payer: Self-pay | Admitting: Urology

## 2021-06-13 VITALS — BP 119/75 | HR 81

## 2021-06-13 DIAGNOSIS — N8110 Cystocele, unspecified: Secondary | ICD-10-CM

## 2021-06-13 NOTE — Progress Notes (Signed)
06/13/2021 10:52 AM   Kristen Simon 1951/07/04 [id number]  Referring provider: Maryland Pink, MD [address] [facility] [place],  [place] [address]  Chief Complaint  Patient presents with   Follow-up    HPI: I was consulted to assess the patient for prolapse.  She feels vaginal bulging.  If she sits or stands too long she feels burning.  She does have some vaginal pain she does not report direct vaginal dryness.  She can sometimes push it up with splinting maneuver with bowel movement  She rarely leaks with urgency.  She rarely leaks with sneezing.  She can leak with laughing.  She wears 2 small pads a day that are damp.  Flow was good.  No bedwetting.  Has not had a hysterectomy.  She had a CT scan October 2021 demonstrating normal kidneys and no hydronephrosis   Patient is not sexually active    Moderate grade 3 cystocele with central defect.  Modest vaginal atrophy.  Mild hypermobility the bladder neck and no stress incontinence.  Uterus descended from 8 or 9 cm to approximately 5 cm.  Very small distal rectocele grade 1  Patient has prolapse symptoms.  She has mild rare incontinence with frequency and nocturia.  Picture was drawn.  If she ever had surgery she could be treated with a transvaginal hysterectomy with vault suspension and cystocele repair and graft.  Because she still has a uterus robotic hysterectomy and vault prolapse repair would be an excellent choice.  Role of urodynamics discussed.  Vaginal estrogen cream prior to surgery would potentially help any soreness or dryness issues.   We spent several minutes discussing goals and her discomfort.  We gave her estrogen 3 nights a week for 1 month and then once weekly and order urodynamics.  I will ask about her pain when she comes back.  She understands that eventually she would likely have a robotic procedure in [facility]  Today Frequency stable.  Prolapse stable.  Incontinence stable During  urodynamics patient voided 211 mL with a maximum flow of 19 mils per second and was catheterized for a few milliliters.  She had a mildly prolonged and bimodal pattern.  Her maximum bladder capacity was 300 mL.  She had increased bladder sensation.  Bladder was stable.  She had no stress incontinence with Valsalva pressure of 67 cm of water.  During voluntary voiding she voided 250 mL with a maximum flow of 13 mils per second.  Maximum voiding pressure is 15 cm water.  Residual was 49 mL.  EMG activity decreased during voiding.  Moderate cystocele fluoroscopically and she may have a small bladder ear on the left side and right side.  The details of the urodynamics are signed and dictated  On further questioning patient was having some low-grade suprapubic burning like a band that improved on estrogen now using twice a week.    PMH: Past Medical History:  Diagnosis Date   Allergy    Anemia    Breast screening, unspecified    COPD (chronic obstructive pulmonary disease) ([place])    Dysphagia    Elevated cholesterol    Fatty liver    GERD (gastroesophageal reflux disease)    Groin mass    History of basal cell carcinoma (BCC) 01/24/2017   right cheek zygoma   History of cancer chemotherapy 2013   History of rectal bleeding    Hx of radiation therapy 2013   Hypertension 2010   Internal hemorrhoids with other  complication 1607   Lymphoma [facility])    Personal history of chemotherapy    Personal history of radiation therapy    Personal history of tobacco use, presenting hazards to health    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    lymphoma 2013   Ulcer 1970   stomach-resolved   Unilateral or unspecified femoral hernia with obstruction     Surgical History: Past Surgical History:  Procedure Laterality Date   ANOSCOPY  03/27/2012   anal biopsy/hemorrhoid-benign   APPENDECTOMY  1970   COLONOSCOPY WITH PROPOFOL N/A 09/30/2018    Procedure: COLONOSCOPY WITH PROPOFOL;  Surgeon: Manya Silvas, MD;  Location[facility] ENDOSCOPY;  Service: Endoscopy;  Laterality: N/A;   ESOPHAGOGASTRODUODENOSCOPY N/A 09/30/2018   Procedure: ESOPHAGOGASTRODUODENOSCOPY (EGD);  Surgeon: Manya Silvas, MD;  Location: [facility] ENDOSCOPY;  Service: Endoscopy;  Laterality: N/A;   FRACTURE SURGERY  [phone]   right wrist,right leg   GROIN MASS OPEN BIOPSY Right 01/24/2012   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   HERNIA REPAIR Right 01/10/2012   right femerol hernia   PATELLA FRACTURE SURGERY Right 1982   WRIST SURGERY Right     Home Medications:  Allergies as of 06/13/2021       Reactions   Casirivimab-imdevimab Anaphylaxis   Amlodipine Other (See Comments)   Muscle cramps   Citalopram Other (See Comments)   Felt like in a tunnel   Gabapentin Other (See Comments)   Felt like in a tunnel   Pregabalin Other (See Comments)   Felt like in a tunnel   Statins    Cramping, hives   Tiotropium Other (See Comments)        Medication List        Accurate as of June 13, 2021 10:52 AM. If you have any questions, ask your nurse or doctor.          acetaminophen-codeine 300-30 MG tablet Commonly known as: TYLENOL #3 Take 1 tablet by mouth every 6 (six) hours as needed.   albuterol 108 (90 Base) MCG/ACT inhaler Commonly known as: VENTOLIN HFA Inhale into the lungs every 6 (six) hours as needed for wheezing or shortness of breath.   aspirin 81 MG tablet Take 81 mg by mouth daily.   baclofen 10 MG tablet Commonly known as: LIORESAL Take 1 tablet by mouth 2 (two) times daily as needed.   budesonide-formoterol 80-4.5 MCG/ACT inhaler Commonly known as: SYMBICORT Inhale 2 puffs into the lungs 2 (two) times daily.   Caltrate 600+D Plus Minerals 600-800 MG-UNIT Chew Chew 1 tablet 3 (three) times a week by mouth.   carvedilol 25 MG tablet Commonly known as: COREG Take 25 mg by mouth 2  (two) times daily.   Cholecalciferol 125 MCG (5000 UT) capsule Take 5,000 Units by mouth daily.   [employer] Probiotic Acidophilus 10 MG Caps Take 1 tablet by mouth daily.   estradiol 0.1 MG/GM vaginal cream Commonly known as: ESTRACE VAGINAL Use 3 nights a week-apply a pea size amount   famotidine 20 MG tablet Commonly known as: PEPCID Take 1 tablet (20 mg total) by mouth 2 (two) times daily.   fluticasone 50 MCG/ACT nasal spray Commonly known as: FLONASE Place 2 sprays into the nose daily.   guaiFENesin-dextromethorphan 100-10 MG/5ML syrup Commonly known as: ROBITUSSIN DM Take 10 mLs by mouth every 4 (four) hours as needed for cough.   loratadine 10 MG dissolvable tablet Commonly known as: CLARITIN  REDITABS Take 10 mg by mouth daily.   meclizine 25 MG tablet Commonly known as: ANTIVERT Take by mouth.   montelukast 10 MG tablet Commonly known as: SINGULAIR Take 10 mg by mouth at bedtime.   RA Fish Oil 1000 MG Caps Take 1 capsule by mouth daily.   senna-docusate 8.6-50 MG tablet Commonly known as: Senokot-S Take 1 tablet by mouth at bedtime as needed.   spironolactone 25 MG tablet Commonly known as: ALDACTONE Take 25 mg by mouth daily.   telmisartan 80 MG tablet Commonly known as: MICARDIS Take by mouth.   traMADol 50 MG tablet Commonly known as: ULTRAM Take 50 mg by mouth 2 (two) times daily as needed.   TYLENOL ARTHRITIS PAIN PO Take 650 mg 3 (three) times daily by mouth.   vitamin E 180 MG (400 UNITS) capsule Take by mouth.   zinc gluconate 50 MG tablet Take 50 mg by mouth daily.        Allergies:  Allergies  Allergen Reactions   Casirivimab-Imdevimab Anaphylaxis   Amlodipine Other (See Comments)    Muscle cramps   Citalopram Other (See Comments)    Felt like in a tunnel   Gabapentin Other (See Comments)    Felt like in a tunnel   Pregabalin Other (See Comments)    Felt like in a tunnel   Statins     Cramping, hives   Tiotropium Other (See  Comments)    Family History: Family History  Problem Relation Age of Onset   Cancer Mother        lung   Breast cancer Maternal Aunt     Social History:  reports that she quit smoking about 5 years ago. Her smoking use included cigarettes. She has a 35.20 pack-year smoking history. She has never used smokeless tobacco. She reports that she does not currently use alcohol. She reports that she does not use drugs.  ROS:                                        Physical Exam: There were no vitals taken for this visit.  Constitutional:  Alert and oriented, No acute distress.   Laboratory Data: Lab Results  Component Value Date   WBC 4.9 10/29/2020   HGB 11.8 (L) 10/29/2020   HCT 35.6 (L) 10/29/2020   MCV 92.7 10/29/2020   PLT 325 10/29/2020    Lab Results  Component Value Date   CREATININE 1.16 (H) 10/29/2020    No results found for: PSA  No results found for: TESTOSTERONE  No results found for: HGBA1C  Urinalysis    Component Value Date/Time   COLORURINE YELLOW (A) 07/22/2020 0605   APPEARANCEUR Hazy (A) 05/23/2021 1507   LABSPEC 1.023 07/22/2020 0605   LABSPEC 1.018 02/05/2012 1035   PHURINE 5.0 07/22/2020 0605   GLUCOSEU Negative 05/23/2021 1507   GLUCOSEU Negative 02/05/2012 [place] 07/22/2020 0605   BILIRUBINUR Negative 05/23/2021 1507   BILIRUBINUR Negative 02/05/2012 [place] 07/22/2020 0605   PROTEINUR Negative 05/23/2021 1507   PROTEINUR NEGATIVE 07/22/2020 0605   NITRITE Negative 05/23/2021 1507   NITRITE NEGATIVE 07/22/2020 0605   LEUKOCYTESUR Negative 05/23/2021 1507   LEUKOCYTESUR NEGATIVE 07/22/2020 0605   LEUKOCYTESUR Negative 02/05/2012 1035    Pertinent Imaging:   Assessment & Plan: Patient has mild mixed incontinence.  She has uterine prolapse with a  cystocele.  Picture was drawn.  A robotic hysterectomy with vault prolapse and sacrocolpopexy discussed.  I recommended watchful waiting  for mild incontinence.  The risk of ongoing and worsening incontinence and sequelae needing secondary procedures discussed.  Patient would not need a rectocele repair.  Watchful waiting discussed.  She would like to proceed with surgery to improve her quality life.  She will continue estrogen cream twice a week.  We will set up this referral  There are no diagnoses linked to this encounter.  No follow-ups on file.  Reece Packer, MD  [place] [address], [place], [place] [address] [phone]

## 2021-06-22 ENCOUNTER — Other Ambulatory Visit: Payer: Self-pay | Admitting: Urology

## 2021-06-23 ENCOUNTER — Encounter (HOSPITAL_COMMUNITY): Payer: Self-pay

## 2021-06-23 NOTE — Patient Instructions (Addendum)
DUE TO COVID-19 ONLY ONE VISITOR IS ALLOWED TO COME WITH YOU AND STAY IN THE WAITING ROOM ONLY DURING PRE OP AND PROCEDURE.   **NO VISITORS ARE ALLOWED IN THE SHORT STAY AREA OR RECOVERY ROOM!!**  IF YOU WILL BE ADMITTED INTO THE HOSPITAL YOU ARE ALLOWED ONLY TWO SUPPORT PEOPLE DURING VISITATION HOURS ONLY (7 AM -8PM)    Up to two visitors ages 20+ are allowed at one time in a patient's room.  The visitors may rotate out with other people throughout the day.  Additionally, up to two children between the ages of 63 and 28 are allowed and do not count toward the number of allowed visitors.  Children within this age range must be accompanied by an adult visitor.  One adult visitor may remain with the patient overnight and must be in the room by 8 PM.  COVID SWAB TESTING MUST BE COMPLETED ON:  Monday 07-04-21, Between the hours of 8 and 3  **MUST PRESENT COMPLETED FORM AT TESTING SITE**    [facility] (backside of the building)  You are not required to quarantine, however you are required to wear a well-fitted mask when you are out and around people not in your household.  Hand Hygiene often Do NOT share personal items Notify your provider if you are in close contact with someone who has COVID or you develop fever 100.4 or greater, new onset of sneezing, cough, sore throat, shortness of breath or body aches.        Your procedure is scheduled on: Wednesday, 07-06-21   Report to [facility] Main  Entrance     Report to admitting at 6:15 AM   Call this number if you have problems the morning of surgery [phone]   Follow bowel prep instructions from surgeon's office (Miralax and Colace)   Do not eat food :After Midnight.   May have liquids until 5:30 AM day of surgery  CLEAR LIQUID DIET  Foods Allowed                                                                     Foods Excluded  Water, Black Coffee (no milk/no creamer) and tea, regular and decaf                               liquids that you cannot  Plain Jell-O in any flavor  (No red)                         see through such as: Fruit ices (not with fruit pulp)                                 milk, soups, orange juice  Iced Popsicles (No red)                                    All solid food  Apple juices Sports drinks like Gatorade (No red) Lightly seasoned clear broth or consume(fat free) Sugar     Oral Hygiene is also important to reduce your risk of infection.                                    Remember - BRUSH YOUR TEETH THE MORNING OF SURGERY WITH YOUR REGULAR TOOTHPASTE   Do NOT smoke after Midnight  Take these medicines the morning of surgery with A SIP OF WATER:  Acetaminophen, Carvedilol, Pepcid, Inhalers, Tramadol if needed  Stop Aspirin one week prior to surgery   Stop all vitamins and herbal supplements a week before surgery             You may not have any metal on your body including hair pins, jewelry, and body piercing             Do not wear make-up, lotions, powders, perfumes or deodorant  Do not wear nail polish including gel and S&S, artificial/acrylic nails, or any other type of covering on natural nails including finger and toenails. If you have artificial nails, gel coating, etc. that needs to be removed by a nail salon please have this removed prior to surgery or surgery may need to be canceled/ delayed if the surgeon/ anesthesia feels like they are unable to be safely monitored.   Do not shave  48 hours prior to surgery.         Do not bring valuables to the hospital. [facility].   Contacts, dentures or bridgework may not be worn into surgery.   Bring small overnight bag day of surgery.  Please read over the following fact sheets you were given: IF YOU HAVE QUESTIONS ABOUT YOUR PRE OP INSTRUCTIONS PLEASE CALL [facility] - Preparing for Surgery Before surgery, you  can play an important role.  Because skin is not sterile, your skin needs to be as free of germs as possible.  You can reduce the number of germs on your skin by washing with CHG (chlorahexidine gluconate) soap before surgery.  CHG is an antiseptic cleaner which kills germs and bonds with the skin to continue killing germs even after washing. Please DO NOT use if you have an allergy to CHG or antibacterial soaps.  If your skin becomes reddened/irritated stop using the CHG and inform your nurse when you arrive at Short Stay. Do not shave (including legs and underarms) for at least 48 hours prior to the first CHG shower.  You may shave your face/neck.  Please follow these instructions carefully:  1.  Shower with CHG Soap the night before surgery and the  morning of surgery.  2.  If you choose to wash your hair, wash your hair first as usual with your normal  shampoo.  3.  After you shampoo, rinse your hair and body thoroughly to remove the shampoo.                             4.  Use CHG as you would any other liquid soap.  You can apply chg directly to the skin and wash.  Gently with a scrungie or clean washcloth.  5.  Apply the CHG Soap to your body ONLY FROM THE NECK DOWN.   Do   not use on face/  open                           Wound or open sores. Avoid contact with eyes, ears mouth and   genitals (private parts).                       Wash face,  Genitals (private parts) with your normal soap.             6.  Wash thoroughly, paying special attention to the area where your    surgery  will be performed.  7.  Thoroughly rinse your body with warm water from the neck down.  8.  DO NOT shower/wash with your normal soap after using and rinsing off the CHG Soap.                9.  Pat yourself dry with a clean towel.            10.  Wear clean pajamas.            11.  Place clean sheets on your bed the night of your first shower and do not  sleep with pets. Day of Surgery : Do not apply any  lotions/deodorants the morning of surgery.  Please wear clean clothes to the hospital/surgery center.  FAILURE TO FOLLOW THESE INSTRUCTIONS MAY RESULT IN THE CANCELLATION OF YOUR SURGERY  PATIENT SIGNATURE_________________________________  NURSE SIGNATURE__________________________________  ________________________________________________________________________  WHAT IS A BLOOD TRANSFUSION? Blood Transfusion Information  A transfusion is the replacement of blood or some of its parts. Blood is made up of multiple cells which provide different functions. Red blood cells carry oxygen and are used for blood loss replacement. White blood cells fight against infection. Platelets control bleeding. Plasma helps clot blood. Other blood products are available for specialized needs, such as hemophilia or other clotting disorders. BEFORE THE TRANSFUSION  Who gives blood for transfusions?  Healthy volunteers who are fully evaluated to make sure their blood is safe. This is blood bank blood. Transfusion therapy is the safest it has ever been in the practice of medicine. Before blood is taken from a donor, a complete history is taken to make sure that person has no history of diseases nor engages in risky social behavior (examples are intravenous drug use or sexual activity with multiple partners). The donor's travel history is screened to minimize risk of transmitting infections, such as malaria. The donated blood is tested for signs of infectious diseases, such as HIV and hepatitis. The blood is then tested to be sure it is compatible with you in order to minimize the chance of a transfusion reaction. If you or a relative donates blood, this is often done in anticipation of surgery and is not appropriate for emergency situations. It takes many days to process the donated blood. RISKS AND COMPLICATIONS Although transfusion therapy is very safe and saves many lives, the main dangers of transfusion include:   Getting an infectious disease. Developing a transfusion reaction. This is an allergic reaction to something in the blood you were given. Every precaution is taken to prevent this. The decision to have a blood transfusion has been considered carefully by your caregiver before blood is given. Blood is not given unless the benefits outweigh the risks. AFTER THE TRANSFUSION Right after receiving a blood transfusion, you will usually feel much better and more energetic. This is especially true if your red blood cells have gotten  low (anemic). The transfusion raises the level of the red blood cells which carry oxygen, and this usually causes an energy increase. The nurse administering the transfusion will monitor you carefully for complications. HOME CARE INSTRUCTIONS  No special instructions are needed after a transfusion. You may find your energy is better. Speak with your caregiver about any limitations on activity for underlying diseases you may have. SEEK MEDICAL CARE IF:  Your condition is not improving after your transfusion. You develop redness or irritation at the intravenous (IV) site. SEEK IMMEDIATE MEDICAL CARE IF:  Any of the following symptoms occur over the next 12 hours: Shaking chills. You have a temperature by mouth above 102 F (38.9 C), not controlled by medicine. Chest, back, or muscle pain. People around you feel you are not acting correctly or are confused. Shortness of breath or difficulty breathing. Dizziness and fainting. You get a rash or develop hives. You have a decrease in urine output. Your urine turns a dark color or changes to pink, red, or brown. Any of the following symptoms occur over the next 10 days: You have a temperature by mouth above 102 F (38.9 C), not controlled by medicine. Shortness of breath. Weakness after normal activity. The white part of the eye turns yellow (jaundice). You have a decrease in the amount of urine or are urinating less  often. Your urine turns a dark color or changes to pink, red, or brown. Document Released: 08/11/2000 Document Revised: 11/06/2011 Document Reviewed: 03/30/2008 [facility] Patient Information 2014 [place], [place].  _______________________________________________________________________

## 2021-06-23 NOTE — Progress Notes (Addendum)
COVID swab appointment:  07-04-21  COVID Vaccine Completed:  No Date COVID Vaccine completed: Has received booster: COVID vaccine manufacturer: [employer]   Date of COVID positive in last 90 days:  No  PCP - Maryland Pink, MD (notes [facility])  Cardiologist - Serafina Royals, MDNathan Zenia Resides, MD (notes [facility])  Pulmonologist - Ottie Glazier, MD (notes [facility])  Chest x-ray - 10-29-20 Epic EKG - 10-29-20 Epic Stress Test - 11-04-20 [facility] ECHO - 10-07-20 [facility] Cardiac Cath - N/A Pacemaker/ICD device last checked: Spinal Cord Stimulator:  Sleep Study - N/A CPAP -   Fasting Blood Sugar - N/A Checks Blood Sugar _____ times a day  Blood Thinner Instructions: Aspirin Instructions:  ASA 81 mg.  To stop one week before surgery per patient Last Dose:  Activity level:  Can go up a flight of stairs without chest pain.  Patient states that she is shortness of breath at times with exertion.  Able to perform activities of daily living  without symptoms of chest pain.  Patient states that she does have shortness of breath with cleaning the house, making the bed at times, not daily shortness of breath.      Anesthesia review:  CAD, SOB with exertion, atherosclerotic PVD, COD, HTN  Patient denies shortness of breath, fever, cough and chest pain at PAT appointment   Patient verbalized understanding of instructions that were given to them at the PAT appointment. Patient was also instructed that they will need to review over the PAT instructions again at home before surgery.

## 2021-06-28 ENCOUNTER — Encounter (HOSPITAL_COMMUNITY): Payer: Self-pay

## 2021-06-28 ENCOUNTER — Encounter (HOSPITAL_COMMUNITY)
Admission: RE | Admit: 2021-06-28 | Discharge: 2021-06-28 | Disposition: A | Discharge status: Home / Self Care | Payer: Medicare HMO | Source: Ambulatory Visit | Attending: Urology | Admitting: Urology

## 2021-06-28 ENCOUNTER — Other Ambulatory Visit: Payer: Self-pay

## 2021-06-28 DIAGNOSIS — Z7951 Long term (current) use of inhaled steroids: Secondary | ICD-10-CM | POA: Diagnosis not present

## 2021-06-28 DIAGNOSIS — I2510 Atherosclerotic heart disease of native coronary artery without angina pectoris: Secondary | ICD-10-CM | POA: Insufficient documentation

## 2021-06-28 DIAGNOSIS — N393 Stress incontinence (female) (male): Secondary | ICD-10-CM | POA: Diagnosis not present

## 2021-06-28 DIAGNOSIS — Z7982 Long term (current) use of aspirin: Secondary | ICD-10-CM | POA: Insufficient documentation

## 2021-06-28 DIAGNOSIS — Z01812 Encounter for preprocedural laboratory examination: Secondary | ICD-10-CM | POA: Diagnosis not present

## 2021-06-28 DIAGNOSIS — Z8616 Personal history of COVID-19: Secondary | ICD-10-CM | POA: Diagnosis not present

## 2021-06-28 DIAGNOSIS — N8189 Other female genital prolapse: Secondary | ICD-10-CM | POA: Insufficient documentation

## 2021-06-28 DIAGNOSIS — J449 Chronic obstructive pulmonary disease, unspecified: Secondary | ICD-10-CM | POA: Insufficient documentation

## 2021-06-28 DIAGNOSIS — Z8541 Personal history of malignant neoplasm of cervix uteri: Secondary | ICD-10-CM | POA: Diagnosis not present

## 2021-06-28 DIAGNOSIS — N819 Female genital prolapse, unspecified: Secondary | ICD-10-CM | POA: Diagnosis present

## 2021-06-28 DIAGNOSIS — I10 Essential (primary) hypertension: Secondary | ICD-10-CM | POA: Insufficient documentation

## 2021-06-28 DIAGNOSIS — Z7901 Long term (current) use of anticoagulants: Secondary | ICD-10-CM | POA: Diagnosis not present

## 2021-06-28 DIAGNOSIS — N3946 Mixed incontinence: Secondary | ICD-10-CM | POA: Diagnosis not present

## 2021-06-28 DIAGNOSIS — K219 Gastro-esophageal reflux disease without esophagitis: Secondary | ICD-10-CM | POA: Diagnosis not present

## 2021-06-28 DIAGNOSIS — N8111 Cystocele, midline: Secondary | ICD-10-CM | POA: Diagnosis not present

## 2021-06-28 DIAGNOSIS — Z79899 Other long term (current) drug therapy: Secondary | ICD-10-CM | POA: Insufficient documentation

## 2021-06-28 DIAGNOSIS — Z87891 Personal history of nicotine dependence: Secondary | ICD-10-CM | POA: Diagnosis not present

## 2021-06-28 HISTORY — DX: Stress incontinence (female) (male): N39.3

## 2021-06-28 HISTORY — DX: Atherosclerotic heart disease of native coronary artery without angina pectoris: I25.10

## 2021-06-28 HISTORY — DX: COVID-19: U07.1

## 2021-06-28 HISTORY — DX: Dyspnea, unspecified: R06.00

## 2021-06-28 HISTORY — DX: Pneumonia, unspecified organism: J18.9

## 2021-06-28 HISTORY — DX: Unspecified osteoarthritis, unspecified site: M19.90

## 2021-06-28 HISTORY — DX: Unspecified atherosclerosis of native arteries of extremities, unspecified extremity: I70.209

## 2021-06-28 LAB — CBC
HCT: 41.6 % (ref 36.0–46.0)
Hemoglobin: 13.9 g/dL (ref 12.0–15.0)
MCH: 30.5 pg (ref 26.0–34.0)
MCHC: 33.4 g/dL (ref 30.0–36.0)
MCV: 91.2 fL (ref 80.0–100.0)
Platelets: 313 10*3/uL (ref 150–400)
RBC: 4.56 MIL/uL (ref 3.87–5.11)
RDW: 12.4 % (ref 11.5–15.5)
WBC: 5.7 10*3/uL (ref 4.0–10.5)
nRBC: 0 % (ref 0.0–0.2)

## 2021-06-28 LAB — COMPREHENSIVE METABOLIC PANEL
ALT: 22 U/L (ref 0–44)
AST: 21 U/L (ref 15–41)
Albumin: 4.5 g/dL (ref 3.5–5.0)
Alkaline Phosphatase: 50 U/L (ref 38–126)
Anion gap: 7 (ref 5–15)
BUN: 17 mg/dL (ref 8–23)
CO2: 26 mmol/L (ref 22–32)
Calcium: 9.8 mg/dL (ref 8.9–10.3)
Chloride: 103 mmol/L (ref 98–111)
Creatinine, Ser: 0.64 mg/dL (ref 0.44–1.00)
GFR, Estimated: >60 mL/min (ref >60)
Glucose, Bld: 115 mg/dL — ABNORMAL HIGH (ref 70–99)
Potassium: 4.9 mmol/L (ref 3.5–5.1)
Sodium: 136 mmol/L (ref 135–145)
Total Bilirubin: 0.6 mg/dL (ref 0.3–1.2)
Total Protein: 7.6 g/dL (ref 6.5–8.1)

## 2021-06-29 LAB — URINE CULTURE

## 2021-06-30 NOTE — Progress Notes (Signed)
Anesthesia Chart Review   Case: [id number] Date/Time: 07/06/21 0815   Procedures:      XI ROBOTIC SUPRACERVICAL  HYSTERECTOMY AND BILATERAL SALPINGO OOPHERECTOMY (Bilateral)     MID URETHRAL  SLING   Anesthesia type: General   Pre-op diagnosis: PELVIC ORGAN PROLAPSE STRESS URINARY INCONTINENCE   Location: [facility] ROOM 05 / [facility]   Surgeons: Ardis Hughs, MD       DISCUSSION:70 y.o. former smoker with h/o HTN, COPD, GERD, CAD, pelvic organ prolapse, urinary stress incontinence scheduled for above procedure 07/06/2021 with Dr. Louis Meckel.   Pt last seen by cardiology 11/10/2020. Per OV note pt stable from cardiac standpoint. Normal Stress test 11/04/2020.   Anticipate pt can proceed with planned procedure barring acute status change and after evaluation DOS. VS: BP (!) 154/69   Pulse 80   Temp 36.7 C (Oral)   Resp 20   Ht 5\' 2"  (1.575 m)   Wt 72.8 kg   SpO2 100%   BMI 29.36 kg/m   PROVIDERS: Maryland Pink, MD is PCP   Flossie Dibble, MD is Cardiologist  LABS: Labs reviewed: Acceptable for surgery. (all labs ordered are listed, but only abnormal results are displayed)  Labs Reviewed  URINE CULTURE - Abnormal; Notable for the following components:      Result Value   Culture MULTIPLE SPECIES PRESENT, SUGGEST RECOLLECTION (*)    All other components within normal limits  COMPREHENSIVE METABOLIC PANEL - Abnormal; Notable for the following components:   Glucose, Bld 115 (*)    All other components within normal limits  CBC  TYPE AND SCREEN     IMAGES:   EKG: 10/29/2020 Rate 95 bpm  NSR   CV: Stress Test 11/04/2020 FINDINGS:  Regional wall motion:  reveals normal myocardial thickening and wall  motion.  The overall quality of the study is good.    Artifacts noted: no  Left ventricular cavity: normal.   Echo 10/07/2020 INTERPRETATION  NORMAL LEFT VENTRICULAR SYSTOLIC FUNCTION  NORMAL RIGHT VENTRICULAR SYSTOLIC FUNCTION  TRIVIAL REGURGITATION NOTED  (See above)  NO VALVULAR STENOSIS  TECHNICALLY DIFFICULT STUDY  POOR SOUND TRANSMISSION  LIMITED VIEWS  Past Medical History:  Diagnosis Date   Allergy    Anemia    Arthritis    Atherosclerotic peripheral vascular disease (HCC)    Breast screening, unspecified    COPD (chronic obstructive pulmonary disease) ([place])    Coronary artery disease    COVID-19    Dysphagia    Dyspnea    With exertion   Elevated cholesterol    Fatty liver    GERD (gastroesophageal reflux disease)    Groin mass    History of basal cell carcinoma (BCC) 01/24/2017   right cheek zygoma   History of cancer chemotherapy 2013   History of rectal bleeding    Hx of radiation therapy 2013   Hypertension 2010   Internal hemorrhoids with other complication 5852   Lymphoma ([place])    Personal history of chemotherapy    Personal history of radiation therapy    Personal history of tobacco use, presenting hazards to health    Pneumonia    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    lymphoma 2013   Stress incontinence    Stress incontinence due to pelvic organ prolapse    Ulcer 1970   stomach-resolved   Unilateral or unspecified femoral hernia with obstruction     Past Surgical History:  Procedure Laterality Date   ANOSCOPY  03/27/2012   anal biopsy/hemorrhoid-benign   APPENDECTOMY  1970   COLONOSCOPY WITH PROPOFOL N/A 09/30/2018   Procedure: COLONOSCOPY WITH PROPOFOL;  Surgeon: Manya Silvas, MD;  Location: [facility] ENDOSCOPY;  Service: Endoscopy;  Laterality: N/A;   ESOPHAGOGASTRODUODENOSCOPY N/A 09/30/2018   Procedure: ESOPHAGOGASTRODUODENOSCOPY (EGD);  Surgeon: Manya Silvas, MD;  Location: [facility] ENDOSCOPY;  Service: Endoscopy;  Laterality: N/A;   FRACTURE SURGERY  [phone]   right wrist,right leg   GROIN MASS OPEN BIOPSY Right 01/24/2012   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   HERNIA REPAIR Right  01/10/2012   right femerol hernia   PATELLA FRACTURE SURGERY Right 1982   SKIN CANCER EXCISION     WRIST SURGERY Right     MEDICATIONS:  Biotin (BIOTIN 5000) 5 MG CAPS   cyanocobalamin 1000 MCG tablet   olopatadine (PATADAY) 0.1 % ophthalmic solution   acetaminophen (TYLENOL) 650 MG CR tablet   albuterol (PROVENTIL HFA;VENTOLIN HFA) 108 (90 BASE) MCG/ACT inhaler   albuterol (PROVENTIL) (2.5 MG/3ML) 0.083% nebulizer solution   Ascorbic Acid (VITAMIN C) 1000 MG tablet   aspirin EC 81 MG tablet   Baclofen 5 MG TABS   benzonatate (TESSALON) 200 MG capsule   budesonide-formoterol (SYMBICORT) 80-4.5 MCG/ACT inhaler   carvedilol (COREG) 6.25 MG tablet   Cholecalciferol (VITAMIN D3) 125 MCG (5000 UT) TABS   estradiol (ESTRACE VAGINAL) 0.1 MG/GM vaginal cream   famotidine (PEPCID) 20 MG tablet   fluticasone (FLONASE) 50 MCG/ACT nasal spray   Lactobacillus ([employer]S PROBIOTIC ACIDOPHILUS) 10 MG CAPS   meclizine (ANTIVERT) 25 MG tablet   montelukast (SINGULAIR) 10 MG tablet   Multiple Minerals-Vitamins (CAL MAG ZINC +D3 PO)   Omega-3 Fatty Acids (RA FISH OIL) 1000 MG CAPS   polyethylene glycol (MIRALAX / GLYCOLAX) 17 g packet   senna-docusate (SENOKOT-S) 8.6-50 MG tablet   spironolactone (ALDACTONE) 25 MG tablet   telmisartan (MICARDIS) 40 MG tablet   traMADol (ULTRAM) 50 MG tablet   vitamin E 180 MG (400 UNITS) capsule   zinc gluconate 50 MG tablet   No current facility-administered medications for this encounter.     Konrad Felix Ward, PA-C [facility]-Surgical Testing [phone]

## 2021-07-01 ENCOUNTER — Inpatient Hospital Stay: Payer: Medicare HMO | Attending: Internal Medicine

## 2021-07-01 ENCOUNTER — Inpatient Hospital Stay: Payer: Medicare HMO | Admitting: Internal Medicine

## 2021-07-04 ENCOUNTER — Other Ambulatory Visit: Payer: Self-pay | Admitting: Urology

## 2021-07-04 LAB — SARS CORONAVIRUS 2 (TAT 6-24 HRS): SARS Coronavirus 2: NEGATIVE

## 2021-07-05 NOTE — Anesthesia Preprocedure Evaluation (Addendum)
Anesthesia Evaluation  Patient identified by MRN, date of birth, ID band Patient awake    Reviewed: Allergy & Precautions, NPO status , Patient's Chart, lab work & pertinent test results  History of Anesthesia Complications Negative for: history of anesthetic complications  Airway Mallampati: II  TM Distance: >3 FB Neck ROM: Full    Dental  (+) Dental Advisory Given, Edentulous Upper, Edentulous Lower   Pulmonary COPD, former smoker,    Pulmonary exam normal        Cardiovascular hypertension, Pt. on medications Normal cardiovascular exam  Pt last seen by cardiology 11/10/2020. Per OV note pt stable from cardiac standpoint. Normal Stress test 11/04/2020.  Stress Test 11/04/2020 FINDINGS:  Regional wall motion: reveals normal myocardial thickening and wall  motion.  The overall quality of the study is good.   Artifacts noted: no  Left ventricular cavity: normal.   Echo 10/07/2020 INTERPRETATION  NORMAL LEFT VENTRICULAR SYSTOLIC FUNCTION  NORMAL RIGHT VENTRICULAR SYSTOLIC FUNCTION  TRIVIAL REGURGITATION NOTED (See above)  NO VALVULAR STENOSIS  TECHNICALLY DIFFICULT STUDY  POOR SOUND TRANSMISSION  LIMITED VIEWS    Neuro/Psych negative neurological ROS     GI/Hepatic Neg liver ROS, GERD  ,  Endo/Other  negative endocrine ROS  Renal/GU negative Renal ROS     Musculoskeletal  (+) Arthritis ,   Abdominal   Peds  Hematology negative hematology ROS (+)   Anesthesia Other Findings   Reproductive/Obstetrics                           Anesthesia Physical Anesthesia Plan  ASA: 3  Anesthesia Plan: General   Post-op Pain Management:    Induction: Intravenous  PONV Risk Score and Plan: 4 or greater and Ondansetron, Dexamethasone, Diphenhydramine and Treatment may vary due to age or medical condition  Airway Management Planned: Oral ETT  Additional Equipment:   Intra-op Plan:    Post-operative Plan: Extubation in OR  Informed Consent: I have reviewed the patients History and Physical, chart, labs and discussed the procedure including the risks, benefits and alternatives for the proposed anesthesia with the patient or authorized representative who has indicated his/her understanding and acceptance.     Dental advisory given  Plan Discussed with: Anesthesiologist and CRNA  Anesthesia Plan Comments:        Anesthesia Quick Evaluation

## 2021-07-06 ENCOUNTER — Ambulatory Visit (HOSPITAL_COMMUNITY): Payer: Medicare HMO | Admitting: Physician Assistant

## 2021-07-06 ENCOUNTER — Encounter (HOSPITAL_COMMUNITY)
Admission: RE | Disposition: A | Discharge status: Home / Self Care | Payer: Self-pay | Source: Home / Self Care | Attending: Urology

## 2021-07-06 ENCOUNTER — Ambulatory Visit (HOSPITAL_COMMUNITY): Payer: Medicare HMO | Admitting: Anesthesiology

## 2021-07-06 ENCOUNTER — Observation Stay (HOSPITAL_COMMUNITY)
Admission: RE | Admit: 2021-07-06 | Discharge: 2021-07-08 | Disposition: A | Discharge status: Home / Self Care | Payer: Medicare HMO | Attending: Urology | Admitting: Urology

## 2021-07-06 ENCOUNTER — Encounter (HOSPITAL_COMMUNITY): Payer: Self-pay | Admitting: Urology

## 2021-07-06 ENCOUNTER — Other Ambulatory Visit: Payer: Self-pay

## 2021-07-06 DIAGNOSIS — Z79899 Other long term (current) drug therapy: Secondary | ICD-10-CM | POA: Diagnosis not present

## 2021-07-06 DIAGNOSIS — J449 Chronic obstructive pulmonary disease, unspecified: Secondary | ICD-10-CM | POA: Insufficient documentation

## 2021-07-06 DIAGNOSIS — Z8616 Personal history of COVID-19: Secondary | ICD-10-CM | POA: Diagnosis not present

## 2021-07-06 DIAGNOSIS — N819 Female genital prolapse, unspecified: Secondary | ICD-10-CM | POA: Diagnosis present

## 2021-07-06 DIAGNOSIS — I10 Essential (primary) hypertension: Secondary | ICD-10-CM | POA: Insufficient documentation

## 2021-07-06 DIAGNOSIS — N8111 Cystocele, midline: Principal | ICD-10-CM | POA: Insufficient documentation

## 2021-07-06 DIAGNOSIS — N3946 Mixed incontinence: Secondary | ICD-10-CM | POA: Insufficient documentation

## 2021-07-06 DIAGNOSIS — Z7982 Long term (current) use of aspirin: Secondary | ICD-10-CM | POA: Insufficient documentation

## 2021-07-06 DIAGNOSIS — Z87891 Personal history of nicotine dependence: Secondary | ICD-10-CM | POA: Insufficient documentation

## 2021-07-06 DIAGNOSIS — Z8541 Personal history of malignant neoplasm of cervix uteri: Secondary | ICD-10-CM | POA: Diagnosis not present

## 2021-07-06 HISTORY — PX: PUBOVAGINAL SLING: SHX1035

## 2021-07-06 HISTORY — PX: CYSTOSCOPY WITH STENT PLACEMENT: SHX5790

## 2021-07-06 HISTORY — PX: ROBOTIC ASSISTED LAPAROSCOPIC SACROCOLPOPEXY: SHX5388

## 2021-07-06 LAB — HEMOGLOBIN AND HEMATOCRIT, BLOOD
HCT: 32.8 % — ABNORMAL LOW (ref 36.0–46.0)
Hemoglobin: 11 g/dL — ABNORMAL LOW (ref 12.0–15.0)

## 2021-07-06 LAB — CBC
HCT: 32.2 % — ABNORMAL LOW (ref 36.0–46.0)
Hemoglobin: 11 g/dL — ABNORMAL LOW (ref 12.0–15.0)
MCH: 30.9 pg (ref 26.0–34.0)
MCHC: 34.2 g/dL (ref 30.0–36.0)
MCV: 90.4 fL (ref 80.0–100.0)
Platelets: 254 10*3/uL (ref 150–400)
RBC: 3.56 MIL/uL — ABNORMAL LOW (ref 3.87–5.11)
RDW: 12.4 % (ref 11.5–15.5)
WBC: 10.8 10*3/uL — ABNORMAL HIGH (ref 4.0–10.5)
nRBC: 0 % (ref 0.0–0.2)

## 2021-07-06 LAB — TYPE AND SCREEN
ABO/RH(D): A POS
Antibody Screen: NEGATIVE

## 2021-07-06 LAB — BASIC METABOLIC PANEL
Anion gap: 6 (ref 5–15)
BUN: 15 mg/dL (ref 8–23)
CO2: 21 mmol/L — ABNORMAL LOW (ref 22–32)
Calcium: 8.5 mg/dL — ABNORMAL LOW (ref 8.9–10.3)
Chloride: 105 mmol/L (ref 98–111)
Creatinine, Ser: 1.17 mg/dL — ABNORMAL HIGH (ref 0.44–1.00)
GFR, Estimated: 50 mL/min — ABNORMAL LOW (ref >60)
Glucose, Bld: 209 mg/dL — ABNORMAL HIGH (ref 70–99)
Potassium: 5.1 mmol/L (ref 3.5–5.1)
Sodium: 132 mmol/L — ABNORMAL LOW (ref 135–145)

## 2021-07-06 LAB — ABO/RH: ABO/RH(D): A POS

## 2021-07-06 SURGERY — SACROCOLPOPEXY, ROBOT-ASSISTED, LAPAROSCOPIC
Anesthesia: General | Site: Abdomen

## 2021-07-06 MED ORDER — MIDAZOLAM HCL 5 MG/5ML IJ SOLN
INTRAMUSCULAR | Status: DC | PRN
  Administered 2021-07-06: 2 mg via INTRAVENOUS

## 2021-07-06 MED ORDER — LACTATED RINGERS IV BOLUS
500.0000 mL | Freq: Once | INTRAVENOUS | Status: AC
  Administered 2021-07-06: 500 mL via INTRAVENOUS

## 2021-07-06 MED ORDER — PROPOFOL 10 MG/ML IV BOLUS
INTRAVENOUS | Status: AC
  Filled 2021-07-06: qty 20

## 2021-07-06 MED ORDER — LACTATED RINGERS IV SOLN: INTRAVENOUS | Status: DC | PRN

## 2021-07-06 MED ORDER — PHENYLEPHRINE HCL-NACL 20-0.9 MG/250ML-% IV SOLN
INTRAVENOUS | Status: DC | PRN
  Administered 2021-07-06: 25 ug/min via INTRAVENOUS

## 2021-07-06 MED ORDER — ACETAMINOPHEN 10 MG/ML IV SOLN
1000.0000 mg | Freq: Four times a day (QID) | INTRAVENOUS | Status: AC
  Administered 2021-07-06 – 2021-07-07 (×4): 1000 mg via INTRAVENOUS
  Filled 2021-07-06 (×4): qty 100

## 2021-07-06 MED ORDER — STERILE WATER FOR IRRIGATION IR SOLN
Status: DC | PRN
  Administered 2021-07-06: 1000 mL

## 2021-07-06 MED ORDER — BUPIVACAINE LIPOSOME 1.3 % IJ SUSP
INTRAMUSCULAR | Status: AC
  Filled 2021-07-06: qty 20

## 2021-07-06 MED ORDER — SPIRONOLACTONE 25 MG PO TABS: 25.0000 mg | ORAL_TABLET | Freq: Every morning | ORAL | Status: DC

## 2021-07-06 MED ORDER — DOCUSATE SODIUM 50 MG PO CAPS: 50.0000 mg | ORAL_CAPSULE | Freq: Two times a day (BID) | ORAL | Status: DC

## 2021-07-06 MED ORDER — ROCURONIUM BROMIDE 10 MG/ML (PF) SYRINGE
PREFILLED_SYRINGE | INTRAVENOUS | Status: DC | PRN
Start: 2021-07-06 — End: 2021-07-06
  Administered 2021-07-06: 20 mg via INTRAVENOUS
  Administered 2021-07-06: 10 mg via INTRAVENOUS
  Administered 2021-07-06: 20 mg via INTRAVENOUS
  Administered 2021-07-06: 80 mg via INTRAVENOUS
  Administered 2021-07-06: 20 mg via INTRAVENOUS

## 2021-07-06 MED ORDER — POLYETHYLENE GLYCOL 3350 17 GM/SCOOP PO POWD: 1.0000 | Freq: Once | ORAL | Status: DC

## 2021-07-06 MED ORDER — SODIUM CHLORIDE 0.9 % IV SOLN
INTRAVENOUS | Status: AC | PRN
  Administered 2021-07-06: 10 mL via INTRAMUSCULAR

## 2021-07-06 MED ORDER — KETOROLAC TROMETHAMINE 15 MG/ML IJ SOLN: 15.0000 mg | Freq: Four times a day (QID) | INTRAMUSCULAR | Status: DC

## 2021-07-06 MED ORDER — DEXAMETHASONE SODIUM PHOSPHATE 10 MG/ML IJ SOLN
INTRAMUSCULAR | Status: AC
  Filled 2021-07-06: qty 1

## 2021-07-06 MED ORDER — SODIUM CHLORIDE (PF) 0.9 % IJ SOLN
INTRAMUSCULAR | Status: AC
  Filled 2021-07-06: qty 10

## 2021-07-06 MED ORDER — SODIUM CHLORIDE 0.9 % IV SOLN: INTRAVENOUS | Status: DC

## 2021-07-06 MED ORDER — BUPIVACAINE-EPINEPHRINE 0.5% -1:200000 IJ SOLN
INTRAMUSCULAR | Status: DC | PRN
  Administered 2021-07-06: 10 mL

## 2021-07-06 MED ORDER — CHLORHEXIDINE GLUCONATE 0.12 % MT SOLN
15.0000 mL | Freq: Once | OROMUCOSAL | Status: AC
  Administered 2021-07-06: 15 mL via OROMUCOSAL

## 2021-07-06 MED ORDER — ESTRADIOL 0.1 MG/GM VA CREA
TOPICAL_CREAM | VAGINAL | Status: AC
  Filled 2021-07-06: qty 42.5

## 2021-07-06 MED ORDER — LACTATED RINGERS IV SOLN: INTRAVENOUS | Status: DC

## 2021-07-06 MED ORDER — CEFAZOLIN SODIUM-DEXTROSE 1-4 GM/50ML-% IV SOLN
1.0000 g | Freq: Three times a day (TID) | INTRAVENOUS | Status: AC
  Administered 2021-07-06 – 2021-07-07 (×3): 1 g via INTRAVENOUS
  Filled 2021-07-06 (×3): qty 50

## 2021-07-06 MED ORDER — PHENYLEPHRINE HCL-NACL 20-0.9 MG/250ML-% IV SOLN
INTRAVENOUS | Status: AC
  Filled 2021-07-06: qty 250

## 2021-07-06 MED ORDER — PROMETHAZINE HCL 25 MG/ML IJ SOLN: 6.2500 mg | INTRAMUSCULAR | Status: DC | PRN

## 2021-07-06 MED ORDER — FENTANYL CITRATE (PF) 100 MCG/2ML IJ SOLN
INTRAMUSCULAR | Status: AC
  Filled 2021-07-06: qty 2

## 2021-07-06 MED ORDER — CHLORHEXIDINE GLUCONATE CLOTH 2 % EX PADS
6.0000 | MEDICATED_PAD | Freq: Every day | CUTANEOUS | Status: DC
  Administered 2021-07-06 – 2021-07-07 (×2): 6 via TOPICAL

## 2021-07-06 MED ORDER — EPHEDRINE SULFATE-NACL 50-0.9 MG/10ML-% IV SOSY
PREFILLED_SYRINGE | INTRAVENOUS | Status: DC | PRN
  Administered 2021-07-06: 10 mg via INTRAVENOUS
  Administered 2021-07-06 (×2): 5 mg via INTRAVENOUS

## 2021-07-06 MED ORDER — FENTANYL CITRATE (PF) 250 MCG/5ML IJ SOLN
INTRAMUSCULAR | Status: AC
  Filled 2021-07-06: qty 5

## 2021-07-06 MED ORDER — METOPROLOL TARTRATE 25 MG PO TABS
25.0000 mg | ORAL_TABLET | Freq: Two times a day (BID) | ORAL | Status: AC
  Administered 2021-07-06: 25 mg via ORAL
  Filled 2021-07-06: qty 1

## 2021-07-06 MED ORDER — DIPHENHYDRAMINE HCL 50 MG/ML IJ SOLN
INTRAMUSCULAR | Status: AC
  Filled 2021-07-06: qty 1

## 2021-07-06 MED ORDER — PHENYLEPHRINE 40 MCG/ML (10ML) SYRINGE FOR IV PUSH (FOR BLOOD PRESSURE SUPPORT)
PREFILLED_SYRINGE | INTRAVENOUS | Status: DC | PRN
  Administered 2021-07-06: 160 ug via INTRAVENOUS
  Administered 2021-07-06: 80 ug via INTRAVENOUS

## 2021-07-06 MED ORDER — BUPIVACAINE-EPINEPHRINE 0.5% -1:200000 IJ SOLN
INTRAMUSCULAR | Status: AC
  Filled 2021-07-06: qty 1

## 2021-07-06 MED ORDER — ALBUMIN HUMAN 5 % IV SOLN: INTRAVENOUS | Status: DC | PRN

## 2021-07-06 MED ORDER — SODIUM CHLORIDE 0.45 % IV SOLN: INTRAVENOUS | Status: DC

## 2021-07-06 MED ORDER — FAMOTIDINE 20 MG PO TABS
20.0000 mg | ORAL_TABLET | Freq: Every day | ORAL | Status: DC
  Administered 2021-07-06 – 2021-07-08 (×3): 20 mg via ORAL
  Filled 2021-07-06 (×3): qty 1

## 2021-07-06 MED ORDER — BUPIVACAINE LIPOSOME 1.3 % IJ SUSP
INTRAMUSCULAR | Status: DC | PRN
  Administered 2021-07-06: 20 mL

## 2021-07-06 MED ORDER — HYDROMORPHONE HCL 1 MG/ML IJ SOLN: 0.5000 mg | INTRAMUSCULAR | Status: DC | PRN

## 2021-07-06 MED ORDER — CEFAZOLIN SODIUM-DEXTROSE 2-4 GM/100ML-% IV SOLN
2.0000 g | INTRAVENOUS | Status: AC
  Administered 2021-07-06: 2 g via INTRAVENOUS
  Filled 2021-07-06: qty 100

## 2021-07-06 MED ORDER — SENNOSIDES-DOCUSATE SODIUM 8.6-50 MG PO TABS
2.0000 | ORAL_TABLET | Freq: Every day | ORAL | Status: DC
  Administered 2021-07-06 – 2021-07-07 (×2): 2 via ORAL
  Filled 2021-07-06 (×2): qty 2

## 2021-07-06 MED ORDER — TRAMADOL HCL 50 MG PO TABS
ORAL_TABLET | ORAL | Status: AC
  Administered 2021-07-06: 50 mg via ORAL
  Filled 2021-07-06: qty 1

## 2021-07-06 MED ORDER — ACETAMINOPHEN 500 MG PO TABS
1000.0000 mg | ORAL_TABLET | Freq: Once | ORAL | Status: AC
  Administered 2021-07-06: 1000 mg via ORAL
  Filled 2021-07-06: qty 2

## 2021-07-06 MED ORDER — FENTANYL CITRATE (PF) 250 MCG/5ML IJ SOLN
INTRAMUSCULAR | Status: DC | PRN
  Administered 2021-07-06 (×3): 25 ug via INTRAVENOUS
  Administered 2021-07-06 (×3): 50 ug via INTRAVENOUS
  Administered 2021-07-06: 25 ug via INTRAVENOUS
  Administered 2021-07-06: 100 ug via INTRAVENOUS

## 2021-07-06 MED ORDER — ONDANSETRON HCL 4 MG/2ML IJ SOLN
INTRAMUSCULAR | Status: AC
  Filled 2021-07-06: qty 2

## 2021-07-06 MED ORDER — ALBUMIN HUMAN 5 % IV SOLN
INTRAVENOUS | Status: AC
  Filled 2021-07-06: qty 250

## 2021-07-06 MED ORDER — AMISULPRIDE (ANTIEMETIC) 5 MG/2ML IV SOLN: 10.0000 mg | Freq: Once | INTRAVENOUS | Status: DC | PRN

## 2021-07-06 MED ORDER — SUGAMMADEX SODIUM 200 MG/2ML IV SOLN
INTRAVENOUS | Status: DC | PRN
  Administered 2021-07-06: 150 mg via INTRAVENOUS

## 2021-07-06 MED ORDER — LIDOCAINE HCL (PF) 2 % IJ SOLN
INTRAMUSCULAR | Status: AC
  Filled 2021-07-06: qty 15

## 2021-07-06 MED ORDER — FENTANYL CITRATE PF 50 MCG/ML IJ SOSY
PREFILLED_SYRINGE | INTRAMUSCULAR | Status: AC
  Administered 2021-07-06: 25 ug via INTRAVENOUS
  Filled 2021-07-06: qty 1

## 2021-07-06 MED ORDER — ONDANSETRON HCL 4 MG/2ML IJ SOLN
4.0000 mg | Freq: Three times a day (TID) | INTRAMUSCULAR | Status: DC
  Administered 2021-07-06 – 2021-07-07 (×4): 4 mg via INTRAVENOUS
  Filled 2021-07-06 (×4): qty 2

## 2021-07-06 MED ORDER — ROCURONIUM BROMIDE 10 MG/ML (PF) SYRINGE
PREFILLED_SYRINGE | INTRAVENOUS | Status: AC
  Filled 2021-07-06: qty 10

## 2021-07-06 MED ORDER — KETOROLAC TROMETHAMINE 15 MG/ML IJ SOLN
INTRAMUSCULAR | Status: AC
  Administered 2021-07-06: 15 mg via INTRAVENOUS
  Filled 2021-07-06: qty 1

## 2021-07-06 MED ORDER — BACITRACIN ZINC 500 UNIT/GM EX OINT
TOPICAL_OINTMENT | CUTANEOUS | Status: AC
  Filled 2021-07-06: qty 28.35

## 2021-07-06 MED ORDER — ACETAMINOPHEN 10 MG/ML IV SOLN
1000.0000 mg | Freq: Four times a day (QID) | INTRAVENOUS | Status: DC
Start: 2021-07-06 — End: 2021-07-06

## 2021-07-06 MED ORDER — KETOROLAC TROMETHAMINE 15 MG/ML IJ SOLN
15.0000 mg | Freq: Four times a day (QID) | INTRAMUSCULAR | Status: AC
  Administered 2021-07-06 – 2021-07-07 (×5): 15 mg via INTRAVENOUS
  Filled 2021-07-06 (×6): qty 1

## 2021-07-06 MED ORDER — CARVEDILOL 6.25 MG PO TABS: 6.2500 mg | ORAL_TABLET | Freq: Two times a day (BID) | ORAL | Status: DC

## 2021-07-06 MED ORDER — MONTELUKAST SODIUM 10 MG PO TABS: 10.0000 mg | ORAL_TABLET | Freq: Every day | ORAL | Status: DC

## 2021-07-06 MED ORDER — TRAMADOL HCL 50 MG PO TABS
50.0000 mg | ORAL_TABLET | Freq: Four times a day (QID) | ORAL | Status: DC | PRN
  Administered 2021-07-07 (×2): 50 mg via ORAL
  Administered 2021-07-08: 100 mg via ORAL
  Filled 2021-07-06: qty 1
  Filled 2021-07-06: qty 2
  Filled 2021-07-06 (×2): qty 1

## 2021-07-06 MED ORDER — PROPOFOL 10 MG/ML IV BOLUS
INTRAVENOUS | Status: DC | PRN
  Administered 2021-07-06: 130 mg via INTRAVENOUS

## 2021-07-06 MED ORDER — ONDANSETRON HCL 4 MG/2ML IJ SOLN
INTRAMUSCULAR | Status: DC | PRN
  Administered 2021-07-06: 4 mg via INTRAVENOUS

## 2021-07-06 MED ORDER — MIDAZOLAM HCL 2 MG/2ML IJ SOLN
INTRAMUSCULAR | Status: AC
  Filled 2021-07-06: qty 2

## 2021-07-06 MED ORDER — ESTRADIOL 0.1 MG/GM VA CREA
TOPICAL_CREAM | VAGINAL | Status: DC | PRN
  Administered 2021-07-06: 1 via VAGINAL

## 2021-07-06 MED ORDER — ORAL CARE MOUTH RINSE: 15.0000 mL | Freq: Once | OROMUCOSAL | Status: AC

## 2021-07-06 MED ORDER — CARVEDILOL 6.25 MG PO TABS
6.2500 mg | ORAL_TABLET | Freq: Two times a day (BID) | ORAL | Status: DC
  Administered 2021-07-07 – 2021-07-08 (×3): 6.25 mg via ORAL
  Filled 2021-07-06 (×3): qty 1

## 2021-07-06 MED ORDER — LIDOCAINE 2% (20 MG/ML) 5 ML SYRINGE
INTRAMUSCULAR | Status: DC | PRN
  Administered 2021-07-06: 1.5 mg/kg/h via INTRAVENOUS
  Administered 2021-07-06: 100 mg via INTRAVENOUS

## 2021-07-06 MED ORDER — OXYCODONE HCL 5 MG PO TABS: 5.0000 mg | ORAL_TABLET | ORAL | Status: DC | PRN

## 2021-07-06 MED ORDER — DIPHENHYDRAMINE HCL 50 MG/ML IJ SOLN
INTRAMUSCULAR | Status: DC | PRN
  Administered 2021-07-06: 6.25 mg via INTRAVENOUS

## 2021-07-06 MED ORDER — CLINDAMYCIN PHOSPHATE 2 % VA CREA
TOPICAL_CREAM | VAGINAL | Status: AC
  Filled 2021-07-06: qty 40

## 2021-07-06 MED ORDER — FENTANYL CITRATE PF 50 MCG/ML IJ SOSY
25.0000 ug | PREFILLED_SYRINGE | INTRAMUSCULAR | Status: DC | PRN
  Administered 2021-07-06 (×2): 25 ug via INTRAVENOUS

## 2021-07-06 MED ORDER — CELECOXIB 200 MG PO CAPS
200.0000 mg | ORAL_CAPSULE | Freq: Once | ORAL | Status: AC
  Administered 2021-07-06: 200 mg via ORAL
  Filled 2021-07-06: qty 1

## 2021-07-06 MED ORDER — DEXAMETHASONE SODIUM PHOSPHATE 10 MG/ML IJ SOLN
INTRAMUSCULAR | Status: DC | PRN
  Administered 2021-07-06: 8 mg via INTRAVENOUS

## 2021-07-06 MED ORDER — MORPHINE SULFATE (PF) 2 MG/ML IV SOLN: 2.0000 mg | INTRAVENOUS | Status: DC | PRN

## 2021-07-06 MED ORDER — FENTANYL CITRATE (PF) 100 MCG/2ML IJ SOLN: INTRAMUSCULAR | Status: DC | PRN

## 2021-07-06 MED ORDER — LACTATED RINGERS IR SOLN
Status: DC | PRN
  Administered 2021-07-06: 1000 mL

## 2021-07-06 MED ORDER — ESMOLOL HCL 100 MG/10ML IV SOLN
INTRAVENOUS | Status: AC
  Filled 2021-07-06: qty 10

## 2021-07-06 MED ORDER — STERILE WATER FOR IRRIGATION IR SOLN
Status: DC | PRN
  Administered 2021-07-06: 3000 mL

## 2021-07-06 SURGICAL SUPPLY — 100 items
BAG COUNTER SPONGE SURGICOUNT (BAG) ×3 IMPLANT
BAG URINE DRAIN 2000ML AR STRL (UROLOGICAL SUPPLIES) ×3 IMPLANT
BAG URO CATCHER STRL LF (MISCELLANEOUS) IMPLANT
BLADE HEX COATED 2.75 (ELECTRODE) IMPLANT
BLADE SURG 15 STRL LF DISP TIS (BLADE) ×4 IMPLANT
BLADE SURG 15 STRL SS (BLADE) ×2
CATH FOLEY 2WAY SLVR  5CC 16FR (CATHETERS) ×1
CATH FOLEY 2WAY SLVR 5CC 16FR (CATHETERS) ×2 IMPLANT
CATH URETL OPEN 5X70 (CATHETERS) IMPLANT
CHLORAPREP W/TINT 26 (MISCELLANEOUS) ×3 IMPLANT
CLIP LIGATING HEMO LOK XL GOLD (MISCELLANEOUS) IMPLANT
CLIP LIGATING HEMO O LOK GREEN (MISCELLANEOUS) ×3 IMPLANT
CLOTH BEACON ORANGE TIMEOUT ST (SAFETY) IMPLANT
COVER BACK TABLE 60X90IN (DRAPES) ×3 IMPLANT
COVER MAYO STAND STRL (DRAPES) IMPLANT
COVER SURGICAL LIGHT HANDLE (MISCELLANEOUS) ×3 IMPLANT
COVER TIP SHEARS 8 DVNC (MISCELLANEOUS) ×2 IMPLANT
COVER TIP SHEARS 8MM DA VINCI (MISCELLANEOUS) ×1
DECANTER SPIKE VIAL GLASS SM (MISCELLANEOUS) ×6 IMPLANT
DERMABOND ADVANCED (GAUZE/BANDAGES/DRESSINGS) ×2
DERMABOND ADVANCED .7 DNX12 (GAUZE/BANDAGES/DRESSINGS) ×4 IMPLANT
DILATOR CANAL MILEX (MISCELLANEOUS) ×3 IMPLANT
DRAIN CHANNEL RND F F (WOUND CARE) IMPLANT
DRAPE ARM DVNC X/XI (DISPOSABLE) ×8 IMPLANT
DRAPE COLUMN DVNC XI (DISPOSABLE) ×2 IMPLANT
DRAPE DA VINCI XI ARM (DISPOSABLE) ×4
DRAPE DA VINCI XI COLUMN (DISPOSABLE) ×1
DRAPE INCISE IOBAN 66X45 STRL (DRAPES) ×3 IMPLANT
DRAPE SHEET LG 3/4 BI-LAMINATE (DRAPES) ×6 IMPLANT
DRAPE SURG IRRIG POUCH 19X23 (DRAPES) ×3 IMPLANT
DRSG TEGADERM 4X4.75 (GAUZE/BANDAGES/DRESSINGS) ×3 IMPLANT
ELECT PENCIL ROCKER SW 15FT (MISCELLANEOUS) ×3 IMPLANT
ELECT REM PT RETURN 15FT ADLT (MISCELLANEOUS) ×3 IMPLANT
GAUZE 4X4 16PLY ~~LOC~~+RFID DBL (SPONGE) ×6 IMPLANT
GAUZE PACKING 2X5 YD STRL (GAUZE/BANDAGES/DRESSINGS) ×3 IMPLANT
GLOVE SRG 8 PF TXTR STRL LF DI (GLOVE) ×2 IMPLANT
GLOVE SURG ENC MOIS LTX SZ6.5 (GLOVE) ×3 IMPLANT
GLOVE SURG ENC TEXT LTX SZ7.5 (GLOVE) ×9 IMPLANT
GLOVE SURG UNDER POLY LF SZ8 (GLOVE) ×1
GOWN STRL REUS W/TWL LRG LVL3 (GOWN DISPOSABLE) ×3 IMPLANT
GOWN STRL REUS W/TWL XL LVL3 (GOWN DISPOSABLE) ×15 IMPLANT
GUIDEWIRE STR DUAL SENSOR (WIRE) IMPLANT
HOLDER FOLEY CATH W/STRAP (MISCELLANEOUS) ×3 IMPLANT
IRRIG SUCT STRYKERFLOW 2 WTIP (MISCELLANEOUS) ×3
IRRIGATION SUCT STRKRFLW 2 WTP (MISCELLANEOUS) ×2 IMPLANT
KIT BASIN OR (CUSTOM PROCEDURE TRAY) ×3 IMPLANT
KIT TURNOVER KIT A (KITS) IMPLANT
MANIFOLD NEPTUNE II (INSTRUMENTS) IMPLANT
MANIPULATOR UTERINE 4.5 ZUMI (MISCELLANEOUS) ×3 IMPLANT
MARKER SKIN DUAL TIP RULER LAB (MISCELLANEOUS) ×3 IMPLANT
MESH UPSYLON ONLY (Mesh General) ×3 IMPLANT
NEEDLE HYPO 22GX1.5 SAFETY (NEEDLE) IMPLANT
NS IRRIG 1000ML POUR BTL (IV SOLUTION) ×3 IMPLANT
OCCLUDER COLPOPNEUMO (BALLOONS) ×3 IMPLANT
PACK CYSTO (CUSTOM PROCEDURE TRAY) IMPLANT
PACKING VAGINAL (PACKING) ×3 IMPLANT
PAD OB MATERNITY 4.3X12.25 (PERSONAL CARE ITEMS) IMPLANT
PAD POSITIONING PINK XL (MISCELLANEOUS) ×3 IMPLANT
PANTS MESH DISP LRG (UNDERPADS AND DIAPERS) IMPLANT
PANTS MESH DISPOSABLE L (UNDERPADS AND DIAPERS)
PENCIL SMOKE EVACUATOR (MISCELLANEOUS) IMPLANT
PLUG CATH AND CAP STER (CATHETERS) IMPLANT
POUCH SPECIMEN RETRIEVAL 10MM (ENDOMECHANICALS) ×3 IMPLANT
RETRACTOR LONRSTAR 16.6X16.6CM (MISCELLANEOUS) ×2 IMPLANT
RETRACTOR STAY HOOK 5MM (MISCELLANEOUS) ×3 IMPLANT
RETRACTOR STER APS 16.6X16.6CM (MISCELLANEOUS) ×3
SEAL CANN UNIV 5-8 DVNC XI (MISCELLANEOUS) ×8 IMPLANT
SEAL XI 5MM-8MM UNIVERSAL (MISCELLANEOUS) ×4
SET IRRIG Y TYPE TUR BLADDER L (SET/KITS/TRAYS/PACK) ×3 IMPLANT
SET TUBE SMOKE EVAC HIGH FLOW (TUBING) ×3 IMPLANT
SHEET LAVH (DRAPES) ×3 IMPLANT
SLING LYNX SUPRAPUBIC (Sling) ×3 IMPLANT
SOL PREP POV-IOD 4OZ 10% (MISCELLANEOUS) ×3 IMPLANT
SOLUTION ELECTROLUBE (MISCELLANEOUS) ×3 IMPLANT
SURGILUBE 2OZ TUBE FLIPTOP (MISCELLANEOUS) ×3 IMPLANT
SUT MNCRL AB 4-0 PS2 18 (SUTURE) ×6 IMPLANT
SUT PROLENE 2 0 CT 1 (SUTURE) ×3 IMPLANT
SUT VIC AB 0 CT1 27 (SUTURE) ×1
SUT VIC AB 0 CT1 27XBRD ANTBC (SUTURE) ×2 IMPLANT
SUT VIC AB 2-0 SH 27 (SUTURE) ×6
SUT VIC AB 2-0 SH 27XBRD (SUTURE) ×12 IMPLANT
SUT VIC AB 2-0 UR6 27 (SUTURE) ×3 IMPLANT
SUT VIC AB 3-0 SH 27 (SUTURE) ×3
SUT VIC AB 3-0 SH 27X BRD (SUTURE) ×6 IMPLANT
SUT VICRYL 0 UR6 27IN ABS (SUTURE) ×3 IMPLANT
SYR 10ML LL (SYRINGE) ×3 IMPLANT
SYR 20ML LL LF (SYRINGE) ×6 IMPLANT
SYR 50ML LL SCALE MARK (SYRINGE) IMPLANT
SYR BULB IRRIG 60ML STRL (SYRINGE) IMPLANT
SYR TOOMEY IRRIG 70ML (MISCELLANEOUS) ×3
SYRINGE TOOMEY IRRIG 70ML (MISCELLANEOUS) ×2 IMPLANT
TOWEL OR 17X26 10 PK STRL BLUE (TOWEL DISPOSABLE) ×6 IMPLANT
TRAY LAPAROSCOPIC (CUSTOM PROCEDURE TRAY) ×3 IMPLANT
TROCAR ENDOPATH XCEL 12X100 BL (ENDOMECHANICALS) ×3 IMPLANT
TROCAR XCEL 12X100 BLDLESS (ENDOMECHANICALS) ×3 IMPLANT
TROCAR XCEL NON-BLD 5MMX100MML (ENDOMECHANICALS) ×3 IMPLANT
TUBING CONNECTING 10 (TUBING) ×3 IMPLANT
WATER STERILE IRR 1000ML POUR (IV SOLUTION) ×3 IMPLANT
WATER STERILE IRR 500ML POUR (IV SOLUTION) IMPLANT
YANKAUER SUCT BULB TIP NO VENT (SUCTIONS) ×3 IMPLANT

## 2021-07-06 NOTE — Interval H&P Note (Signed)
History and Physical Interval Note:  07/06/2021 8:42 AM  Kristen Simon  has presented today for surgery, with the diagnosis of PELVIC ORGAN PROLAPSE STRESS URINARY INCONTINENCE.  The various methods of treatment have been discussed with the patient and family. After consideration of risks, benefits and other options for treatment, the patient has consented to  Procedure(s): XI ROBOTIC [place] (Bilateral) CYSTOSCOPY (N/A) MID URETHERAL SLING (N/A) as a surgical intervention.  The patient's history has been reviewed, patient examined, no change in status, stable for surgery.  I have reviewed the patient's chart and labs.  Questions were answered to the patient's satisfaction.     Ardis Hughs

## 2021-07-06 NOTE — Op Note (Signed)
Preoperative diagnosis:   Pelvic organ prolapse Stress urinary incontinence  Postoperative diagnosis:   Same  Procedure: Robotic-assisted laparoscopic supracervical hysterectomy and  bilateral salpingo-oopherectomy Robotic-assisted laparoscopic sacrocolpopexy Cystoscopy with mid urethral sling  Surgeon: Ardis Hughs, MD First assistant: Aldine Contes, MD  Anesthesia: General  Complications: None  Intraoperative findings:   #1: The patient's sacral promontory was difficult to localize because of the posterior peritoneal fat.  Ultimately, was able to find the appropriate position for the anchor stitch. 2.:  The patient's uterus was normal in appearance and size appropriate 3.:  [employer] U[employer] Y mesh used for the sacrocolpopexy. 4.:  A [employer] mid urethral sling was used for the incontinence procedure. 5.:  Cystoscopy demonstrated no cystotomy or bladder injury with passage of the trochars for the sling.  The bladder anatomy is otherwise normal.  EBL: 150 mL  Specimens: Supracervical hysterectomy and salpingo-ectomy  Indication: Kristen Simon is a 70 y.o. female patient with symptomatic pelvic organ prolapse.    After reviewing the management options for treatment, she elected to proceed with the above surgical procedure(s). We have discussed the potential benefits and risks of the procedure, side effects of the proposed treatment, the likelihood of the patient achieving the goals of the procedure, and any potential problems that might occur during the procedure or recuperation. Informed consent has been obtained.  Description of procedure:  The patient was taken to the operating room and general anesthesia was induced.  The patient was placed in the dorsal lithotomy position, prepped and draped in the usual sterile fashion, and preoperative antibiotics were administered. A preoperative time-out was performed.    Then I made a periumbilical  incision carrying the dissection down to the patient's fascia with electrocautery.  Once to the fascia, the fascia was incised and a small puncture hole made in the peritoneum to allow passage of a 33mm port.   The abdomen was insufflated and the remaining ports placed under digital guidance.  2 ports were placed lateral to the umbilicus on the right proximally 10 cm apart.  The most lateral port was approximately 3 cm above the anterior iliac spine.  2 additional ports were placed in the patient's right side in comparable positions to the most lateral port on the right was a 12 mm port.the robot was then docked at an angle from the leg obliquely along the side of the left leg.  I then turned my attention to the patient's vagina, placed a vaginal speculum, and with the tenaculum grasped the cervix.  I then dilated up the cervical os to approximately 16 [place].  I then passed the uterine manipulator into the patient's uterus and inflated the balloon with 10 cc of air and advanced the KOH ring onto the cervix.  I then passed a 16 French Foley catheter.  We then began our surgery by cleaning up some of the pelvic adhesions to the small bowel and colon.  Once this was completed I started dissecting at the sacral promontory located 3 cm medial to the location where the ureter crosses over the iliac vessels at the pelvic brim. The posterior peritoneum was incised and the sacral prominence cleared off an area taking care to avoid the middle sacral vessels and the iliac branches.  This was in a seemingly deep location given the posterior peritoneal fat.  I then created a posterior peritoneal tunnel starting at the sacral promontory and tunneling down the right pelvic sidewall down into the pelvis breaking  back through the posterior peritoneum around the vesico-vaginal junction posteriorly.  I then continued the posterior dissection retracting down on the rectum and finding the avascular plane between the posterior  vaginal wall and the rectum.  I carried this dissection down as far as I could to along the area of the perineal body.  Then focused my attention to the uterus and hysterectomy.  I first started by taking the right round ligament with a series of by polar cautery.  I then dissected the anterior leaf of the broad ligament slightly more proximal and then distally down across the anterior mucosa salpinx and the internal cervical os.  I then took of the uterine ovarian ligament on the right and dissected free the right salpinx. Once the anterior leaf of the broad ligament had been completely dissected on the patient's right and a small bladder flap had been created anteriorly attention was turned to the left side where a similar dissection was carried out. I then turned my attention to the anterior plane between the anterior vaginal wall and the bladder.  I was able to obtain access to the avascular plane and with a combination of both monopolar cautery and blunt dissection was able to clean and nice down to the bladder neck.  I then turned my attention back to the patient's uterus and skeletonized the right uterine artery and vein and then took this with a series of bipolar moves.  I then performed a similar uterus pedicle ligation on the left.  This point I was able to identify the patient's cervix and came through supracervical with monopolar cautery once the uterus was freed from all its attachments it was pushed into the left paracolic gutter and our attention was turned to placing the wire mesh.  Mesh was measured at approximately 7.5 cm anteriorly and 7.5 cm posteriorly and I cut this on the back table.  The mesh was then placed into the patient's abdomen through the assistant port and the anterior leaf was secured down onto the anterior vaginal wall with the apex at the bladder neck.  The posterior leaf was then secured down on the posterior vaginal wall.  These were sewn down with 2-0 Vicryl.  Between 6 and  8 were done on each side.  At this point I then went back to the previously dissected sacral promontory and posterior peritoneal tunnel and inserted a instrument through the tunnel and grasped the end of the mesh at the vaginal cuff and pull it up to the sacrum.  I then checked to ensure that the sacral mesh was not too tight by performing a vaginal exam.  I then secured the sacral leg of the mesh using a 0 Prolene.  I then reapproximated the posterior peritoneum with a 2-0 Vicryl in a running fashion around the sacral promontory.  The pelvic peritoneum was closed using a pursestring.  A small Endo Catch bag was then gently passed through the assistant port and the uterus was placed in the bag.  The bag was then brought out through the camera port once the trochars were removed.  We then made a slightly larger extraction incision to remove the uterus.  The fascia was then closed with 0 Vicryl in a figure-of-eight fashion.  The skin was closed with 4-0 Monocryl's.  Dermabond was applied to the incision and exparel injected into the incisions.  The Foley catheter was then capped. The horseshoe [place] retractor was then applied to the drape and the Foley catheter  was attached to it. Using the skin hooks for skin nodes were placed in the corners of the labia minora to open up the vaginal vestibule. 5 cc of quarter percent Marcaine with 1% epinephrine was then injected into the periurethral tissue. The suprapubic incisions were then marked out 1 cm lateral to midline and 1 cm above the pubic bone. Using a 15 blade a stab incision was made in both sides of midline. Gauze is placed over these areas to control the skin bleeding. A 1.5 cm incision was then made in the mid urethra through the vaginal mucosa. Using this Lee scissors dissection was then carried out. Her urethra we laterally on both sides to the endopelvic fascia. The dissection was completed once there was enough space to place a finger through the  incision on both sides of the urethra. Using the [facility] needle set both needles were passed through the stab incisions suprapubically down posterior to the pubic bone and then through the periurethral incisions using the index finger to guide the needle out of the incision. Once both needles had been placed the Foley catheter was removed and cystoscopy was performed. A 70 lens was passed gently through the patient's urethra and into the bladder under visual guidance. A 360 cystoscopic evaluation was performed. There was no mucosal abnormalities or evidence of perforation from the needles. The cystoscope was then removed and the Foley catheter replaced. The bladder was then drained again and the Foley catheter. The ends of the sling were then attached to the needles and the needles pulled up through the retropubic space and out the suprapubic incision. Once the sling was noted to be centered, the blue plastic cap at the apex of the sling was cut and the plastic sheath was then pulled out. The mesh was noted to be well seated around the urethra. A right angle was used to ensure that the proper amount of tension for the sling around the urethra applied. The sling was noted to be tension free and well positioned. At this point copious amounts of double antibiotic irrigation was then used to irrigate the incision the periurethral space and the vagina. The incision was then closed with 2-0 Vicryl in a running fashion. The stab incisions in the suprapubic area were closed with Dermabond. The vagina was then packed with clindamycin impregnated vaginal packing. The patient was subsequently extubated and returned to the PACU in stable condition.  Estrace impregnated packing was then placed into her vagina which will be left in overnight.  The patient was subsequently extubated and returned to the PACU in excellent condition.

## 2021-07-06 NOTE — H&P (Signed)
70 year old female referred by Dr. Matilde Sprang for further management and evaluation of pelvic organ prolapse.   The patient states that she feels vaginal bulging, especially if she sits or stands too long. She feels burning and discomfort. She sometimes has to splint for bowel movements. She denies any significant urinary incontinence. She denies any real urgency. She does have some mild stress incontinence. She wears 2 pads a day, describes her stream as strong.   The patient maintains her uterus. She had a CT scan in 10/20 one of the abdomen pelvis which demonstrated no hydroureteronephrosis.   The patient has been started on Estrace cream. She also has completed urodynamics.   UDS, 06/08/2021: The patient's bladder capacity is approximately 300 mL, there was no detrusor overactivity or real evidence of stress urinary incontinence with the bladder reduced.   The patient has a history of COPD, stopped smoking 5 years ago. She also has a history of lymphoma for which she received chemotherapy and radiation-she had a 3 cm groin mass excisional biopsy in 2013. She had an appendectomy in 1970.     ALLERGIES:  Amlodipine Citalopram Gabapentin pregabalin Statin Drugs Tiotropium Bromide    MEDICATIONS: Acetaminophen-Codeine 300 mg-30 mg tablet  Adult Low Dose Aspirin Ec 81 mg tablet, delayed release  Albuterol Sulfate  Baclofen 10 mg tablet  Budesonide-Formoterol Fumarate 80 mcg-4.5 mcg/actuation hfa aerosol with adapter  Caltrate 600 + D  Carvedilol 25 mg tablet  Estradiol  Famotidine 20 mg tablet  Fish Oil  Flonase Allergy Relief 50 mcg/actuation spray, suspension  Loratadine 10 mg tablet  Meclizine Hcl 25 mg tablet  Montelukast Sodium 10 mg tablet  Probiotic  Senokot-S  Spironolactone 25 mg tablet  Telmisartan 80 mg tablet  Tramadol Hcl 50 mg tablet  Vitamin E     GU PSH: Complex cystometrogram, w/ void pressure and urethral pressure profile studies, any technique -  06/08/2021 Complex Uroflow - 06/08/2021 Emg surf Electrd - 06/08/2021 Inject For cystogram - 06/08/2021 Intrabd voidng Press - 06/08/2021       PSH Notes: colonoscopy with propofol (09/2018), Esophagogastroduodenoscopy (09/2018), wrist fracture (right- 1958), Leg fracture (right- 1982), 3 cm lymph node-groin mass, Patella fracture- right (1982)   NON-GU PSH: Anoscopy And Biopsy, 02/2012 Appendectomy, 1970     GU PMH: Mixed incontinence - 06/08/2021      PMH Notes: H/O stomach ulcer, Lymphoma ([place])- 2013, H/O Basal cell carcinoma- right cheek ([place]- 2018)   NON-GU PMH: Anemia, unspecified COPD Dysphagia, unspecified Fatty (change of) liver, not elsewhere classified GERD Hypertension    FAMILY HISTORY: Breast Cancer - Aunt Lung Cancer - Mother   SOCIAL HISTORY: No Social History    VITAL SIGNS: None   GU PHYSICAL EXAMINATION:    External Genitalia: No hirsutism, no rash, no scarring, no cyst, no erythematous lesion, no papular lesion, no blanched lesion, no warty lesion. No edema.  Urethral Meatus: Normal size. Normal position. No discharge.  Urethra: No tenderness, no mass, no scarring. No hypermobility. No leakage.  Bladder: Normal to palpation, no tenderness, no mass, normal size.  Vagina: No atrophy, no stenosis. No rectocele. No cystocele. No enterocele.  Cervix: No inflammation, no discharge, no lesion, no tenderness, no wart.  Uterus: Normal size. Normal consistency. Normal position. No mobility. No descent.  Anus and Perineum: No hemorrhoids. No anal stenosis. No rectal fissure, no anal fissure. No edema, no dimple, no perineal tenderness, no anal tenderness.   MULTI-SYSTEM PHYSICAL EXAMINATION:    Constitutional: Well-nourished. No physical deformities. Normally  developed. Good grooming.  Neck: Neck symmetrical, not swollen. Normal tracheal position.  Respiratory: No labored breathing, no use of accessory muscles.   Cardiovascular: Normal temperature, normal  extremity pulses, no swelling, no varicosities.  Lymphatic: No enlargement of neck, axillae, groin.  Skin: No paleness, no jaundice, no cyanosis. No lesion, no ulcer, no rash.  Neurologic / Psychiatric: Oriented to time, oriented to place, oriented to person. No depression, no anxiety, no agitation.  Gastrointestinal: No mass, no tenderness, no rigidity, non obese abdomen.  Eyes: Normal conjunctivae. Normal eyelids.  Ears, Nose, Mouth, and Throat: Left ear no scars, no lesions, no masses. Right ear no scars, no lesions, no masses. Nose no scars, no lesions, no masses. Normal hearing. Normal lips.  Musculoskeletal: Normal gait and station of head and neck.     Complexity of Data:  Source Of History:  Patient  Records Review:   Previous Doctor Records, Previous Hospital Records, Previous Patient Records, POC Tool  Urine Test Review:   Urinalysis   PROCEDURES:          Urinalysis Dipstick Dipstick Cont'd  Color: Yellow Bilirubin: Neg mg/dL  Appearance: Clear Ketones: Neg mg/dL  Specific Gravity: 1.020 Blood: Neg ery/uL  pH: 6.0 Protein: Neg mg/dL  Glucose: Neg mg/dL Urobilinogen: 0.2 mg/dL    Nitrites: Neg    Leukocyte Esterase: Neg leu/uL    ASSESSMENT:      ICD-10 Details  1 GU:   Mixed incontinence - N39.46   2   Cystocele, midline - N81.11    PLAN:           Document Letter(s):  Created for Patient: Clinical Summary         Notes:   I discussed with the patient and a supracervical hysterectomy with bilateral salpingo-oophorectomy. I discussed the rationale for the surgery as well as the associated risk and benefits. I explained her that I did use mesh, but there was no mesh controversy or FDA warning. I went through the associated complications including failure, mesh erosion or extrusion, and damage to the surrounding structures. Having gone through all the risks of the operation, including drawing pictures for her to explain how the surgery worked she is opted to proceed. I  also explained to her that she has been to be in the hospital at least 1 night and then that recovery was 6 weeks of no heavy lifting, bending, or twisting.   In addition, we discussed mid urethral sling, the patient has opted to proceed given that she does have some stress associated incontinence. She would like to go ahead and get this over with, and do it at the same time as the surgery for her prolapse repair. Again we went through the risk and the benefits, including the aspect of mesh erosion.

## 2021-07-06 NOTE — Progress Notes (Signed)
Pt arrived to unit on stretcher, slid to bed independently. VSS on 2LNC. Vaginal packing in place with minimal bloody vaginal drainage. Foley w/ c/y urine. Abd lap sites x6 with dermabond intact. Pt oriented to callbell and environment. POC discussed w/ pt and husband at bedside.

## 2021-07-06 NOTE — Transfer of Care (Signed)
Immediate Anesthesia Transfer of Care Note  Patient: Kristen Simon  Procedure(s) Performed: XI ROBOTIC ASSISTED LAPAROSCOPIC SACROCOLPOPEXY/SUPRACERVICAL  HYSTERECTOMY AND BILATERAL SALPINGO OOPHERECTOMY (Bilateral: Abdomen) CYSTOSCOPY MID URETHERAL SLING  Patient Location: PACU  Anesthesia Type:General  Level of Consciousness: awake, alert  and oriented  Airway & Oxygen Therapy: Patient Spontanous Breathing and Patient connected to face mask oxygen  Post-op Assessment: Report given to RN, Post -op Vital signs reviewed and stable and Patient moving all extremities X 4  Post vital signs: Reviewed and stable  Last Vitals:  Vitals Value Taken Time  BP 166/75 07/06/21 1433  Temp    Pulse 121 07/06/21 1435  Resp 8 07/06/21 1435  SpO2 98 % 07/06/21 1435  Vitals shown include unvalidated device data.  Last Pain:  Vitals:   07/06/21 0650  TempSrc:   PainSc: 0-No pain         Complications: No notable events documented.

## 2021-07-06 NOTE — Anesthesia Procedure Notes (Signed)
Procedure Name: Intubation Date/Time: 07/06/2021 8:51 AM Performed by: Victoriano Lain, CRNA Pre-anesthesia Checklist: Patient identified, Emergency Drugs available, Suction available, Patient being monitored and Timeout performed Patient Re-evaluated:Patient Re-evaluated prior to induction Oxygen Delivery Method: Circle system utilized Preoxygenation: Pre-oxygenation with 100% oxygen Induction Type: IV induction Ventilation: Mask ventilation without difficulty Laryngoscope Size: Mac and 4 Grade View: Grade I Tube type: Oral Tube size: 7.0 mm Number of attempts: 1 Airway Equipment and Method: Stylet Placement Confirmation: ETT inserted through vocal cords under direct vision, positive ETCO2 and breath sounds checked- equal and bilateral Secured at: 21 cm Tube secured with: Tape Dental Injury: Teeth and Oropharynx as per pre-operative assessment

## 2021-07-07 DIAGNOSIS — N8111 Cystocele, midline: Secondary | ICD-10-CM | POA: Diagnosis not present

## 2021-07-07 LAB — CBC
HCT: 29 % — ABNORMAL LOW (ref 36.0–46.0)
Hemoglobin: 9.8 g/dL — ABNORMAL LOW (ref 12.0–15.0)
MCH: 30.7 pg (ref 26.0–34.0)
MCHC: 33.8 g/dL (ref 30.0–36.0)
MCV: 90.9 fL (ref 80.0–100.0)
Platelets: 212 10*3/uL (ref 150–400)
RBC: 3.19 MIL/uL — ABNORMAL LOW (ref 3.87–5.11)
RDW: 12.2 % (ref 11.5–15.5)
WBC: 9.1 10*3/uL (ref 4.0–10.5)
nRBC: 0 % (ref 0.0–0.2)

## 2021-07-07 LAB — BASIC METABOLIC PANEL
Anion gap: 6 (ref 5–15)
BUN: 17 mg/dL (ref 8–23)
CO2: 19 mmol/L — ABNORMAL LOW (ref 22–32)
Calcium: 8.3 mg/dL — ABNORMAL LOW (ref 8.9–10.3)
Chloride: 104 mmol/L (ref 98–111)
Creatinine, Ser: 1.29 mg/dL — ABNORMAL HIGH (ref 0.44–1.00)
GFR, Estimated: 44 mL/min — ABNORMAL LOW (ref >60)
Glucose, Bld: 117 mg/dL — ABNORMAL HIGH (ref 70–99)
Potassium: 5.5 mmol/L — ABNORMAL HIGH (ref 3.5–5.1)
Sodium: 129 mmol/L — ABNORMAL LOW (ref 135–145)

## 2021-07-07 MED ORDER — BIOTIN 5 MG PO CAPS: 1.0000 | ORAL_CAPSULE | Freq: Every day | ORAL | 0 refills | Status: AC

## 2021-07-07 MED ORDER — GUAIFENESIN ER 600 MG PO TB12
600.0000 mg | ORAL_TABLET | Freq: Two times a day (BID) | ORAL | Status: DC | PRN
  Administered 2021-07-07 (×2): 600 mg via ORAL
  Filled 2021-07-07 (×2): qty 1

## 2021-07-07 MED ORDER — SULFAMETHOXAZOLE-TRIMETHOPRIM 800-160 MG PO TABS: 1.0000 | ORAL_TABLET | Freq: Two times a day (BID) | ORAL | 0 refills | Status: AC

## 2021-07-07 MED ORDER — TRAMADOL HCL 50 MG PO TABS: 50.0000 mg | ORAL_TABLET | Freq: Four times a day (QID) | ORAL | 0 refills | Status: DC | PRN

## 2021-07-07 NOTE — Discharge Summary (Addendum)
Date of admission: 07/06/2021  Date of discharge: 07/08/2021  Admission diagnosis: Pelvic organ prolapse   Discharge diagnosis: Pelvic organ prolapse   Secondary diagnoses:  Patient Active Problem List   Diagnosis Date Noted   Pelvic prolapse 07/06/2021   Prolapse of female pelvic organs 07/06/2021   Pneumonia due to COVID-19 virus 07/22/2020   Respiratory failure, acute ([place]) 07/22/2020   Anaphylaxis 07/21/2020   Long term current use of opiate analgesic 03/19/2017   Long term prescription opiate use 03/19/2017   Opiate use 03/19/2017   Chronic pain syndrome 03/19/2017   Chronic low back pain, unspecified back pain laterality, with sciatica presence unspecified (primary) (bilateral) (R>L) 03/19/2017   Chronic neck pain 03/19/2017   Pain in both lower extremities (tertiary)(R>L) 03/19/2017   Bilateral groin pain (secondary) (R>L) 03/19/2017   Sacroiliac joint pain 03/19/2017   Primary cervical cancer ([place]) 12/29/2016   Personal history of tobacco use, presenting hazards to health 01/13/2016   Benign essential HTN 01/29/2015   Mixed hyperlipidemia 01/29/2015   Shortness of breath 01/29/2015   Cough 01/02/2014   Secondary malignant neoplasm of lymph 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 Briefly, Kristen Simon is a 70 y.o. year old patient with history of incontinence and pelvic organ prolapse who presents for robotic sacrocolpopexy and mesh sling placement.   Hospital Course: Patient tolerated the procedure well.  She was then transferred to the floor after an uneventful PACU stay.  Her hospital course was uncomplicated.  On POD1 her catheter was removed and she  was voiding spontaneously. Her vaginal packing was also removed. She was tolerating a regular diet without nausea or vomiting. She was ambulating independently but felt unsteady on her feet so stayed admitted until POD2. On POD#2 she had met discharge criteria: was eating a regular diet, was up and ambulating independently,  pain was well controlled, was voiding without a catheter, and was ready to for discharge.  Exam on date of discharge NAD Vitals:   07/07/21 1652 07/07/21 2039 07/07/21 2124 07/08/21 0537  BP: (!) 130/56 (!) 158/69 134/66 137/71  Pulse: 95 99 94 97  Resp: 18 20 16 18   Temp: 98 F (36.7 C)  98.5 F (36.9 C) 98.3 F (36.8 C)  TempSrc: Oral Oral Oral Oral  SpO2: 95% 95% 97% 96%  Weight:      Height:        Intake/Output Summary (Last 24 hours) at 07/08/2021 0724 Last data filed at 07/08/2021 0542 Gross per 24 hour  Intake 756.33 ml  Output 1700 ml  Net -943.67 ml   Non-labored breathing Abdomen is soft, incisions c/d/I, appropriately tender, mild bruising Ext symmetric    Laboratory values:  Recent Labs    07/06/21 1630 07/07/21 0435  WBC 10.8* 9.1  HGB 11.0*  11.0* 9.8*  HCT 32.2*  32.8* 29.0*   Recent Labs    07/06/21 1630 07/07/21 0435  NA 132* 129*  K 5.1 5.5*  CL 105 104  CO2 21* 19*  GLUCOSE 209* 117*  BUN 15 17  CREATININE 1.17* 1.29*  CALCIUM 8.5* 8.3*   No results for input(s): LABPT, INR in the last 72 hours. No results for input(s): LABURIN in the last 72 hours. Results for orders placed  or performed in visit on 07/04/21  SARS Coronavirus 2 (TAT 6-24 hrs)     Status: None   Collection Time: 07/04/21 12:00 AM  Result Value Ref Range Status   SARS Coronavirus 2 RESULT: NEGATIVE  Final    Comment: RESULT: NEGATIVESARS-CoV-2 INTERPRETATION:A NEGATIVE  test result means that SARS-CoV-2 RNA was not present in the specimen above the limit of detection of this test. This does not preclude a possible SARS-CoV-2 infection and should not  be used as the  sole basis for patient management decisions. Negative results must be combined with clinical observations, patient history, and epidemiological information. Optimum specimen types and timing for peak viral levels during infections caused by SARS-CoV-2  have not been determined. Collection of multiple specimens or types of specimens may be necessary to detect virus. Improper specimen collection and handling, sequence variability under primers/probes, or organism present below the limit of detection may  lead to false negative results. Positive and negative predictive values of testing are highly dependent on prevalence. False negative test results are more likely when prevalence of disease is high.The expected result is NEGATIVE.Fact S heet for  Healthcare Providers: LocalChronicle.no Sheet for Patients: SalonLookup.es Reference Range - Negative     Disposition: Home  Discharge instruction: The patient was instructed to be ambulatory but told to refrain from heavy lifting, strenuous activity, or driving.   Discharge medications:  Allergies as of 07/08/2021       Reactions   Casirivimab-imdevimab Anaphylaxis   (REGN-COV)   Amlodipine Other (See Comments)   Muscle cramps   Citalopram Other (See Comments)   Felt like in a tunnel   Gabapentin Other (See Comments)   Felt like in a tunnel   Pregabalin Other (See Comments)   Felt like in a tunnel   Statins    Cramping, hives   Tiotropium Other (See Comments)   Dizziness (SPIRIVA)        Medication List     TAKE these medications    acetaminophen 650 MG CR tablet Commonly known as: TYLENOL Take 650-1,300 mg by mouth 2 (two) times daily as needed for pain.   albuterol 108 (90 Base) MCG/ACT inhaler Commonly known as: VENTOLIN HFA Inhale into the lungs every 6 (six) hours as needed for wheezing or shortness of breath.   albuterol (2.5 MG/3ML) 0.083%  nebulizer solution Commonly known as: PROVENTIL Take 2.5 mg by nebulization every 6 (six) hours as needed for wheezing or shortness of breath (COPD).   aspirin EC 81 MG tablet Take 81 mg by mouth 2 (two) times a week. Swallow whole.   Baclofen 5 MG Tabs Take 5 mg by mouth 2 (two) times daily as needed (spasms).   benzonatate 200 MG capsule Commonly known as: TESSALON Take 200 mg by mouth 3 (three) times daily as needed for cough.   Biotin 5 MG Caps Take 1 capsule (5 mg total) by mouth daily. What changed: how much to take   budesonide-formoterol 80-4.5 MCG/ACT inhaler Commonly known as: SYMBICORT Inhale 1 puff into the lungs in the morning and at bedtime.   CAL MAG ZINC +D3 PO Take 1 tablet by mouth 3 (three) times a week. At night   carvedilol 6.25 MG tablet Commonly known as: COREG Take 6.25 mg by mouth 2 (two) times daily.   [employer] Probiotic Acidophilus 10 MG Caps Take 1 capsule by mouth 3 (three) times a week.   cyanocobalamin 1000 MCG tablet Take 1,000 mcg by mouth daily.   estradiol 0.1  MG/GM vaginal cream Commonly known as: ESTRACE VAGINAL Use 3 nights a week-apply a pea size amount What changed:  how much to take how to take this when to take this additional instructions   famotidine 20 MG tablet Commonly known as: PEPCID Take 1 tablet (20 mg total) by mouth 2 (two) times daily. What changed:  when to take this reasons to take this   fluticasone 50 MCG/ACT nasal spray Commonly known as: FLONASE Place 2 sprays into the nose daily as needed (allergies/congestion.).   meclizine 25 MG tablet Commonly known as: ANTIVERT Take 25 mg by mouth 3 (three) times daily as needed (lightheaded/nausea/dizzy).   montelukast 10 MG tablet Commonly known as: SINGULAIR Take 10 mg by mouth at bedtime.   olopatadine 0.1 % ophthalmic solution Commonly known as: PATANOL Place 1 drop into both eyes 2 (two) times daily.   polyethylene glycol 17 g packet Commonly known  as: MIRALAX / GLYCOLAX Take 17 g by mouth daily as needed (constipation.).   RA Fish Oil 1000 MG Caps Take 1,000 mg by mouth 3 (three) times a week.   senna-docusate 8.6-50 MG tablet Commonly known as: Senokot-S Take 1 tablet by mouth at bedtime as needed.   spironolactone 25 MG tablet Commonly known as: ALDACTONE Take 25 mg by mouth in the morning.   sulfamethoxazole-trimethoprim 800-160 MG tablet Commonly known as: BACTRIM DS Take 1 tablet by mouth 2 (two) times daily.   telmisartan 40 MG tablet Commonly known as: MICARDIS Take 40 mg by mouth in the morning.   traMADol 50 MG tablet Commonly known as: Ultram Take 1-2 tablets (50-100 mg total) by mouth every 6 (six) hours as needed for moderate pain. What changed:  how much to take when to take this reasons to take this   vitamin C 1000 MG tablet Take 1,000 mg by mouth 3 (three) times a week.   Vitamin D3 125 MCG (5000 UT) Tabs Take 5,000 Units by mouth 3 (three) times a week.   vitamin E 180 MG (400 UNITS) capsule Take 400 Units by mouth 3 (three) times a week.   zinc gluconate 50 MG tablet Take 25 mg by mouth 3 (three) times a week.        Followup:   Follow-up Information     Hollace Hayward, NP Follow up on 07/20/2021.   Why: 10:45 Contact information: [address] [place] [address] [phone]

## 2021-07-07 NOTE — Progress Notes (Signed)
1 Day Post-Op Subjective: Feeling well today, voiding spontaneously with catheter removed, Some RLQ pain at assistant site but otherwise pain is well controlled. Feels unsteady on her feet so does not want to discharge until tomorrow.   Objective: Vital signs in last 24 hours: Temp:  [97.9 F (36.6 C)-99.1 F (37.3 C)] 98.2 F (36.8 C) (11/10 1309) Pulse Rate:  [82-131] 91 (11/10 1309) Resp:  [9-20] 18 (11/10 1309) BP: (119-171)/(53-106) 128/53 (11/10 1309) SpO2:  [95 %-100 %] 95 % (11/10 1309) Weight:  [77.2 kg] 77.2 kg (11/09 1717)  Intake/Output from previous day: 11/09 0701 - 11/10 0700 In: 5285.4 [P.O.:120; I.V.:3415.4; IV Piggyback:1750] Out: 450 [Urine:300; Blood:150] Intake/Output this shift: Total I/O In: 100 [IV Piggyback:100] Out: 1200 [Urine:1200]  Physical Exam:  General: Alert and oriented CV: RRR Lungs: Clear Abdomen: Soft, ND, mild tenderness in RLQ Incisions: C/D/I, mild bruising around port sites  Ext: NT, No erythema  Lab Results: Recent Labs    07/06/21 1630 07/07/21 0435  HGB 11.0*  11.0* 9.8*  HCT 32.2*  32.8* 29.0*   BMET Recent Labs    07/06/21 1630 07/07/21 0435  NA 132* 129*  K 5.1 5.5*  CL 105 104  CO2 21* 19*  GLUCOSE 209* 117*  BUN 15 17  CREATININE 1.17* 1.29*  CALCIUM 8.5* 8.3*     Studies/Results: No results found.  Assessment/Plan: 70 y/o female POD1 from robotic hysterectomy with sacrocolpopexy and vaginal sling   -Remove foley today, TOV -Remove vaginal packing  -Stop fluids -Regular diet -Ambulate  -Discharge tomorrow am  -Plan for 3 days peri-op antibiotics    LOS: 0 days   Madden Garron 07/07/2021, 1:51 PM

## 2021-07-07 NOTE — Discharge Instructions (Signed)

## 2021-07-07 NOTE — Progress Notes (Signed)
Spoke to Dr. Louis Meckel regarding patient feeling dizzy and lightheaded. Pt stated she did not feel safe to go home today. DC held for today.

## 2021-07-07 NOTE — Progress Notes (Signed)
Foley removed per protocol, patient tolerated well. Vaginal packing removed, tolerated well. Ambulated in hall and up in chair currently.

## 2021-07-07 NOTE — Anesthesia Postprocedure Evaluation (Signed)
Anesthesia Post Note  Patient: Kristen Simon  Procedure(s) Performed: XI ROBOTIC ASSISTED LAPAROSCOPIC SACROCOLPOPEXY/SUPRACERVICAL  HYSTERECTOMY AND BILATERAL SALPINGO OOPHERECTOMY (Bilateral: Abdomen) CYSTOSCOPY MID URETHERAL SLING     Patient location during evaluation: PACU Anesthesia Type: General Level of consciousness: awake and alert Pain management: pain level controlled Vital Signs Assessment: post-procedure vital signs reviewed and stable Respiratory status: spontaneous breathing, nonlabored ventilation, respiratory function stable and patient connected to nasal cannula oxygen Cardiovascular status: blood pressure returned to baseline and stable Postop Assessment: no apparent nausea or vomiting Anesthetic complications: no   No notable events documented.  Last Vitals:  Vitals:   07/07/21 0020 07/07/21 0446  BP: (!) 136/58 136/72  Pulse: 91 91  Resp: 18   Temp: 37.2 C 36.9 C  SpO2: 99% 98%    Last Pain:  Vitals:   07/07/21 0525  TempSrc:   PainSc: 5                  [name]

## 2021-07-08 DIAGNOSIS — N8111 Cystocele, midline: Secondary | ICD-10-CM | POA: Diagnosis not present

## 2021-07-08 LAB — SURGICAL PATHOLOGY

## 2021-07-08 NOTE — TOC Initial Note (Signed)
Transition of Care [facility]) - Initial/Assessment Note    Patient Details  Name: Kristen Simon MRN: [id number] Date of Birth: 1950-10-04  [facility] CM/SW Contact:    Leeroy Cha, RN Phone Number: 07/08/2021, 8:50 AM  Clinical Narrative:                 Date of admission: 07/06/2021   Date of discharge: 07/08/2021   Admission diagnosis: Pelvic organ prolapse    Discharge diagnosis: Pelvic organ prolapse    Secondary diagnoses:      Patient Active Problem List    Diagnosis Date Noted   Pelvic prolapse 07/06/2021   Prolapse of female pelvic organs 07/06/2021   Pneumonia due to COVID-19 virus 07/22/2020   Respiratory failure, acute ([place]) 07/22/2020   Anaphylaxis 07/21/2020   Long term current use of opiate analgesic 03/19/2017   Long term prescription opiate use 03/19/2017   Opiate use 03/19/2017   Chronic pain syndrome 03/19/2017   Chronic low back pain, unspecified back pain laterality, with sciatica presence unspecified (primary) (bilateral) (R>L) 03/19/2017   Chronic neck pain 03/19/2017   Pain in both lower extremities (tertiary)(R>L) 03/19/2017   Bilateral groin pain (secondary) (R>L) 03/19/2017   Sacroiliac joint pain 03/19/2017   Primary cervical cancer ([place]) 12/29/2016   Personal history of tobacco use, presenting hazards to health 01/13/2016   Benign essential HTN 01/29/2015   Mixed hyperlipidemia 01/29/2015   Shortness of breath 01/29/2015   Cough 01/02/2014   Secondary malignant neoplasm of lymph nodes of inguinal region or lower extremity ([place])     History of cancer chemotherapy        Procedures performed: Procedure(s): XI ROBOTIC ASSISTED LAPAROSCOPIC SACROCOLPOPEXY/SUPRACERVICAL  HYSTERECTOMY AND BILATERAL SALPINGO OOPHERECTOMY CYSTOSCOPY MID URETHERAL SLING   History and Physical: For full details, please see admission history and physical. Briefly, Kristen Simon is a 70 y.o. year old patient with history of incontinence  and pelvic organ prolapse who presents for robotic sacrocolpopexy and mesh sling placement.    Hospital Course: Patient tolerated the procedure well.  She was then transferred to the floor after an uneventful PACU stay.  Her hospital course was uncomplicated.  On POD1 her catheter was removed and she was voiding spontaneously. Her vaginal packing was also removed. She was tolerating a regular diet without nausea or vomiting. She was ambulating independently but felt unsteady on her feet so stayed admitted until POD2. On POD#2 she had met discharge criteria: was eating a regular diet, was up and ambulating independently,  pain was well controlled, was voiding without a catheter, and was ready to for discharge.  TOC PLAN OF CARE: Discharged to home with no toc needs.  Expected Discharge Plan: Home/Self Care Barriers to Discharge: No Barriers Identified   Patient Goals and CMS Choice Patient states their goals for this hospitalization and ongoing recovery are:: to go back home and be well CMS Medicare.gov Compare Post Acute Care list provided to:: Patient    Expected Discharge Plan and Services Expected Discharge Plan: Home/Self Care   Discharge Planning Services: CM Consult   Living arrangements for the past 2 months: Single Family Home Expected Discharge Date: 07/08/21                                    Prior Living Arrangements/Services Living arrangements for the past 2 months: Single Family Home Lives with:: Spouse Patient language and need  for interpreter reviewed:: Yes Do you feel safe going back to the place where you live?: Yes            Criminal Activity/Legal Involvement Pertinent to Current Situation/Hospitalization: No - Comment as needed  Activities of Daily Living Home Assistive Devices/Equipment: Cane (specify quad or straight) ADL Screening (condition at time of admission) Patient's cognitive ability adequate to safely complete daily activities?: Yes Is  the patient deaf or have difficulty hearing?: No Does the patient have difficulty seeing, even when wearing glasses/contacts?: No Does the patient have difficulty concentrating, remembering, or making decisions?: No Patient able to express need for assistance with ADLs?: Yes Does the patient have difficulty dressing or bathing?: No Independently performs ADLs?: Yes (appropriate for developmental age) Does the patient have difficulty walking or climbing stairs?: Yes Weakness of Legs: None Weakness of Arms/Hands: None  Permission Sought/Granted                  Emotional Assessment Appearance:: Appears stated age Attitude/Demeanor/Rapport: Engaged Affect (typically observed): Calm Orientation: : Oriented to Place, Oriented to Self, Oriented to  Time, Oriented to Situation Alcohol / Substance Use: Not Applicable Psych Involvement: No (comment)  Admission diagnosis:  Pelvic prolapse [N81.9] Prolapse of female pelvic organs [N81.9] Patient Active Problem List   Diagnosis Date Noted   Pelvic prolapse 07/06/2021   Prolapse of female pelvic organs 07/06/2021   Pneumonia due to COVID-19 virus 07/22/2020   Respiratory failure, acute ([place]) 07/22/2020   Anaphylaxis 07/21/2020   Long term current use of opiate analgesic 03/19/2017   Long term prescription opiate use 03/19/2017   Opiate use 03/19/2017   Chronic pain syndrome 03/19/2017   Chronic low back pain, unspecified back pain laterality, with sciatica presence unspecified (primary) (bilateral) (R>L) 03/19/2017   Chronic neck pain 03/19/2017   Pain in both lower extremities (tertiary)(R>L) 03/19/2017   Bilateral groin pain (secondary) (R>L) 03/19/2017   Sacroiliac joint pain 03/19/2017   Primary cervical cancer ([place]) 12/29/2016   Personal history of tobacco use, presenting hazards to health 01/13/2016   Benign essential HTN 01/29/2015   Mixed hyperlipidemia 01/29/2015   Shortness of breath 01/29/2015   Cough 01/02/2014    Secondary malignant neoplasm of lymph nodes of inguinal region or lower extremity ([place])    History of cancer chemotherapy    PCP:  [place] Pink, MD Pharmacy:   [facility] [address]), [place] [address] [place]) [place] [address] Phone: [phone] Fax: [phone]     Social Determinants of Health (SDOH) Interventions    Readmission Risk Interventions Readmission Risk Prevention Plan 07/23/2020  Transportation Screening Complete  Social Work Consult for [place] Planning/Counseling Complete  Palliative Care Screening Not Applicable  Medication Review Press photographer) Complete  Some recent data might be hidden

## 2021-07-08 NOTE — Progress Notes (Signed)
Patient has been taught and explained discharge instructions. Patient has no further questions. IV removed and site clean, dry and intact upon removal.

## 2021-07-08 NOTE — Plan of Care (Signed)
Pt alert no c/o dizziness thru the night, ambulates to the bathroom with SBA,  no c/o n/v, pt refused scheduled Zofran, given PRN pain med, VSS and will continue plan of care.  Problem: Health Behavior/Discharge Planning: Goal: Ability to manage health-related needs will improve Outcome: Progressing   Problem: Activity: Goal: Risk for activity intolerance will decrease Outcome: Progressing   Problem: Elimination: Goal: Will not experience complications related to bowel motility Outcome: Progressing Goal: Will not experience complications related to urinary retention Outcome: Progressing   Problem: Pain Managment: Goal: General experience of comfort will improve Outcome: Progressing

## 2021-07-08 NOTE — Care Management Obs Status (Signed)
[place] NOTIFICATION   Patient Details  Name: ALICYN KLANN MRN: [id number] Date of Birth: July 19, 1951   Medicare Observation Status Notification Given:  Yes    Leeroy Cha, RN 07/08/2021, 8:52 AM

## 2021-07-15 ENCOUNTER — Encounter (HOSPITAL_COMMUNITY): Payer: Self-pay | Admitting: Urology

## 2022-01-18 ENCOUNTER — Ambulatory Visit: Payer: Medicare HMO | Admitting: Dermatology

## 2022-01-18 DIAGNOSIS — L719 Rosacea, unspecified: Secondary | ICD-10-CM

## 2022-01-18 DIAGNOSIS — L578 Other skin changes due to chronic exposure to nonionizing radiation: Secondary | ICD-10-CM | POA: Diagnosis not present

## 2022-01-18 DIAGNOSIS — L821 Other seborrheic keratosis: Secondary | ICD-10-CM

## 2022-01-18 DIAGNOSIS — L814 Other melanin hyperpigmentation: Secondary | ICD-10-CM

## 2022-01-18 DIAGNOSIS — L918 Other hypertrophic disorders of the skin: Secondary | ICD-10-CM

## 2022-01-18 DIAGNOSIS — D1800 Hemangioma unspecified site: Secondary | ICD-10-CM

## 2022-01-18 DIAGNOSIS — Z1283 Encounter for screening for malignant neoplasm of skin: Secondary | ICD-10-CM | POA: Diagnosis not present

## 2022-01-18 DIAGNOSIS — D229 Melanocytic nevi, unspecified: Secondary | ICD-10-CM | POA: Diagnosis not present

## 2022-01-18 DIAGNOSIS — L84 Corns and callosities: Secondary | ICD-10-CM

## 2022-01-18 NOTE — Progress Notes (Signed)
Follow-Up Visit   Subjective  Kristen Simon is a 71 y.o. female who presents for the following: Annual Exam (Hx BCC). The patient presents for Total-Body Skin Exam (TBSE) for skin cancer screening and mole check.  The patient has spots, moles and lesions to be evaluated, some may be new or changing and the patient has concerns that these could be cancer.  The following portions of the chart were reviewed this encounter and updated as appropriate:   Tobacco  Allergies  Meds  Problems  Med Hx  Surg Hx  Fam Hx     Review of Systems:  No other skin or systemic complaints except as noted in HPI or Assessment and Plan.  Objective  Well appearing patient in no apparent distress; mood and affect are within normal limits.  A full examination was performed including scalp, head, eyes, ears, nose, lips, neck, chest, axillae, abdomen, back, buttocks, bilateral upper extremities, bilateral lower extremities, hands, feet, fingers, toes, fingernails, and toenails. All findings within normal limits unless otherwise noted below.  Face Pinkness on the cheeks.  Feet Thickened skin.    Assessment & Plan  Rosacea Face Mild, no tx needed -  Rosacea is a chronic progressive skin condition usually affecting the face of adults, causing redness and/or acne bumps. It is treatable but not curable. It sometimes affects the eyes (ocular rosacea) as well. It may respond to topical and/or systemic medication and can flare with stress, sun exposure, alcohol, exercise and some foods.  Daily application of broad spectrum spf 30+ sunscreen to face is recommended to reduce flares.  Callus Feet Recommend following up with podiatrist if bothersome.  Skin cancer screening  Lentigines - Scattered tan macules - Due to sun exposure - Benign-appearing, observe - Recommend daily broad spectrum sunscreen SPF 30+ to sun-exposed areas, reapply every 2 hours as needed. - Call for any changes  Seborrheic  Keratoses - Stuck-on, waxy, tan-brown papules and/or plaques  - Benign-appearing - Discussed benign etiology and prognosis. - Observe - Call for any changes  Melanocytic Nevi - Tan-brown and/or pink-flesh-colored symmetric macules and papules - Benign appearing on exam today - Observation - Call clinic for new or changing moles - Recommend daily use of broad spectrum spf 30+ sunscreen to sun-exposed areas.   Hemangiomas - Red papules - Discussed benign nature - Observe - Call for any changes  Actinic Damage - Chronic condition, secondary to cumulative UV/sun exposure - diffuse scaly erythematous macules with underlying dyspigmentation - Recommend daily broad spectrum sunscreen SPF 30+ to sun-exposed areas, reapply every 2 hours as needed.  - Staying in the shade or wearing long sleeves, sun glasses (UVA+UVB protection) and wide brim hats (4-inch brim around the entire circumference of the hat) are also recommended for sun protection.  - Call for new or changing lesions.  Acrochordons (Skin Tags) -  Removal desired by patient - irritated and traumatized by clothing and jewelry  - Fleshy, skin-colored pedunculated papules - Benign appearing.  - Prior to procedure, discussed risks of blister formation, small wound, skin dyspigmentation, or rare scar following cryotherapy.  PROCEDURE - Cryotherapy was performed to affected areas of the neck. The procedure was tolerated well. Wound care was reviewed with the patient. They were advised to call with any concerns.  Total number of treated acrochordons 4 (R neck).   Skin cancer screening performed today.  Return in about 1 year (around 01/19/2023) for TBSE.  Kristen Simon, CMA, am acting as scribe for [employer]  Kristen Massed, MD . Documentation: I have reviewed the above documentation for accuracy and completeness, and I agree with the above.  Kristen Ser, MD

## 2022-01-18 NOTE — Patient Instructions (Signed)

## 2022-01-23 ENCOUNTER — Encounter: Payer: Self-pay | Admitting: Dermatology

## 2022-04-19 ENCOUNTER — Ambulatory Visit: Payer: Medicare HMO

## 2022-05-09 ENCOUNTER — Other Ambulatory Visit: Payer: Self-pay | Admitting: Family Medicine

## 2022-05-09 DIAGNOSIS — N281 Cyst of kidney, acquired: Secondary | ICD-10-CM

## 2022-05-09 DIAGNOSIS — E785 Hyperlipidemia, unspecified: Secondary | ICD-10-CM

## 2022-05-10 ENCOUNTER — Ambulatory Visit
Admission: RE | Admit: 2022-05-10 | Discharge: 2022-05-10 | Disposition: A | Discharge status: Home / Self Care | Payer: BC Managed Care – PPO | Source: Ambulatory Visit | Attending: Family Medicine | Admitting: Family Medicine

## 2022-05-10 DIAGNOSIS — E785 Hyperlipidemia, unspecified: Secondary | ICD-10-CM | POA: Diagnosis present

## 2022-05-10 DIAGNOSIS — N281 Cyst of kidney, acquired: Secondary | ICD-10-CM | POA: Insufficient documentation

## 2022-05-11 ENCOUNTER — Ambulatory Visit (INDEPENDENT_AMBULATORY_CARE_PROVIDER_SITE_OTHER): Payer: Medicare HMO | Admitting: Podiatry

## 2022-05-11 ENCOUNTER — Encounter: Payer: Self-pay | Admitting: Podiatry

## 2022-05-11 ENCOUNTER — Ambulatory Visit: Payer: Medicare HMO

## 2022-05-11 DIAGNOSIS — Q828 Other specified congenital malformations of skin: Secondary | ICD-10-CM

## 2022-05-11 DIAGNOSIS — M7751 Other enthesopathy of right foot: Secondary | ICD-10-CM

## 2022-05-11 NOTE — Progress Notes (Signed)
Subjective:  Patient ID: Kristen Simon, female    DOB: 15-Oct-1950,  MRN: [id number]  Chief Complaint  Patient presents with   Callouses    Right foot below 5th toe     71 y.o. female presents with the above complaint.  Patient presents with right submetatarsal 4 porokeratotic lesion with underlying capsulitis painful to touch is progressive gotten worse hurts with ambulation hurts with pressure.  She has not seen anyone else prior to seeing me.  She states she went to [facility] and the podiatrist did not give him good explanation for what happened.  She wants to give further information she wanted discuss all treatment options.   Review of Systems: Negative except as noted in the HPI. Denies N/V/F/Ch.  Past Medical History:  Diagnosis Date   Allergy    Anemia    Arthritis    Atherosclerotic peripheral vascular disease ([place])    Breast screening, unspecified    COPD (chronic obstructive pulmonary disease) ([place])    Coronary artery disease    COVID-19    Dysphagia    Dyspnea    With exertion   Elevated cholesterol    Fatty liver    GERD (gastroesophageal reflux disease)    Groin mass    History of basal cell carcinoma (BCC) 01/24/2017   right cheek zygoma   History of cancer chemotherapy 2013   History of rectal bleeding    Hx of radiation therapy 2013   Hypertension 2010   Internal hemorrhoids with other complication 1779   Lymphoma ([place])    Personal history of chemotherapy    Personal history of radiation therapy    Personal history of tobacco use, presenting hazards to health    Pneumonia    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    lymphoma 2013   Stress incontinence    Stress incontinence due to pelvic organ prolapse    Ulcer 1970   stomach-resolved   Unilateral or unspecified femoral hernia with obstruction     Current Outpatient Medications:    montelukast (SINGULAIR) 10 MG tablet, Take 1  tablet by mouth at bedtime., Disp: , Rfl:    ondansetron (ZOFRAN-ODT) 4 MG disintegrating tablet, ondansetron 4 mg disintegrating tablet  DISSOLVE 1 TABLET IN MOUTH EVERY 6 HOURS AS NEEDED FOR NAUSEA, Disp: , Rfl:    promethazine-dextromethorphan (PROMETHAZINE-DM) 6.25-15 MG/5ML syrup, promethazine-DM 6.25 mg-15 mg/5 mL oral syrup  TAKE 5 ML BY MOUTH EVERY 6 HOURS AS NEEDED, Disp: , Rfl:    traMADol (ULTRAM) 50 MG tablet, Take 1 tablet by mouth 2 (two) times daily., Disp: , Rfl:    triamcinolone cream (KENALOG) 0.1 %, , Disp: , Rfl:    acetaminophen (TYLENOL) 650 MG CR tablet, Take 650-1,300 mg by mouth 2 (two) times daily as needed for pain., Disp: , Rfl:    albuterol (PROVENTIL HFA;VENTOLIN HFA) 108 (90 BASE) MCG/ACT inhaler, Inhale into the lungs every 6 (six) hours as needed for wheezing or shortness of breath., Disp: , Rfl:    albuterol (PROVENTIL) (2.5 MG/3ML) 0.083% nebulizer solution, Take 2.5 mg by nebulization every 6 (six) hours as needed for wheezing or shortness of breath (COPD)., Disp: , Rfl:    amoxicillin-clavulanate (AUGMENTIN) 875-125 MG tablet, amoxicillin 875 mg-potassium clavulanate 125 mg tablet  TAKE 1 TABLET BY MOUTH EVERY 12 HOURS FOR 7 DAYS, Disp: , Rfl:    Ascorbic Acid (VITAMIN C) 1000 MG tablet, Take 1,000 mg by mouth 3 (  three) times a week., Disp: , Rfl:    aspirin EC 81 MG tablet, Take 81 mg by mouth 2 (two) times a week. Swallow whole., Disp: , Rfl:    baclofen (LIORESAL) 10 MG tablet, TAKE 1 TABLET BY MOUTH EVERY 12 HOURS AS DIRECTED, Disp: , Rfl:    Baclofen 5 MG TABS, Take 5 mg by mouth 2 (two) times daily as needed (spasms)., Disp: , Rfl:    benzonatate (TESSALON) 200 MG capsule, Take 200 mg by mouth 3 (three) times daily as needed for cough., Disp: , Rfl:    Biotin 5 MG CAPS, Take 1 capsule (5 mg total) by mouth daily., Disp: , Rfl: 0   budesonide (PULMICORT) 1 MG/2ML nebulizer solution, USE 1 VIAL IN NEBULIZER ONCE DAILY, Disp: , Rfl:    budesonide-formoterol  (SYMBICORT) 80-4.5 MCG/ACT inhaler, Inhale 1 puff into the lungs in the morning and at bedtime., Disp: , Rfl:    carvedilol (COREG) 6.25 MG tablet, Take 6.25 mg by mouth 2 (two) times daily., Disp: , Rfl:    Cholecalciferol (VITAMIN D3) 125 MCG (5000 UT) TABS, Take 5,000 Units by mouth 3 (three) times a week., Disp: , Rfl:    cyanocobalamin 1000 MCG tablet, Take 1,000 mcg by mouth daily., Disp: , Rfl:    diazepam (VALIUM) 5 MG tablet, Take 5 mg by mouth as directed., Disp: , Rfl:    estradiol (ESTRACE VAGINAL) 0.1 MG/GM vaginal cream, Use 3 nights a week-apply a pea size amount (Patient taking differently: Place 1 Applicatorful vaginally 2 (two) times a week. Use 2 nights a week-apply a pea size amount), Disp: 42.5 g, Rfl: 12   famotidine (PEPCID) 20 MG tablet, Take 1 tablet (20 mg total) by mouth 2 (two) times daily. (Patient taking differently: Take 20 mg by mouth 2 (two) times daily as needed for indigestion or heartburn.), Disp: 30 tablet, Rfl: 0   famotidine (PEPCID) 20 MG tablet, Take 1 tablet by mouth 2 (two) times daily., Disp: , Rfl:    fluticasone (FLONASE) 50 MCG/ACT nasal spray, Place 2 sprays into the nose daily as needed (allergies/congestion.)., Disp: , Rfl:    hydrochlorothiazide (MICROZIDE) 12.5 MG capsule, Take 1 capsule by mouth daily., Disp: , Rfl:    Lactobacillus (ACIDOPHILUS) CAPS capsule, Take by mouth., Disp: , Rfl:    Lactobacillus ([employer] PROBIOTIC ACIDOPHILUS) 10 MG CAPS, Take 1 capsule by mouth 3 (three) times a week., Disp: , Rfl:    levofloxacin (LEVAQUIN) 250 MG tablet, Take 250 mg by mouth daily., Disp: , Rfl:    meclizine (ANTIVERT) 25 MG tablet, Take 25 mg by mouth 3 (three) times daily as needed (lightheaded/nausea/dizzy)., Disp: , Rfl:    montelukast (SINGULAIR) 10 MG tablet, Take 10 mg by mouth at bedtime., Disp: , Rfl:    Multiple Minerals-Vitamins (CAL MAG ZINC +D3 PO), Take 1 tablet by mouth 3 (three) times a week. At night, Disp: , Rfl:    olopatadine (PATANOL)  0.1 % ophthalmic solution, Place 1 drop into both eyes 2 (two) times daily., Disp: , Rfl:    Omega-3 Fatty Acids (RA FISH OIL) 1000 MG CAPS, Take 1,000 mg by mouth 3 (three) times a week., Disp: , Rfl:    polyethylene glycol (MIRALAX / GLYCOLAX) 17 g packet, Take 17 g by mouth daily as needed (constipation.)., Disp: , Rfl:    predniSONE (DELTASONE) 10 MG tablet, prednisone 10 mg tablet  TAKE 6 TABLETS ON DAY 1 THEN TAKE 5 TABLETS ON DAY 2 THEN TAKE  4 TABLETS ON DAY 3 THEN TAKE 3 TABLETS ON DAY 4 THEN TAKE 2 TABLETS ON DAY 5 THEN TAKE 1 TABLET ON DAY 6. TAKE ALL DOSES WITH FOOD., Disp: , Rfl:    pregabalin (LYRICA) 25 MG capsule, Take by oral route for 30 days., Disp: , Rfl:    senna-docusate (SENOKOT-S) 8.6-50 MG tablet, Take 1 tablet by mouth at bedtime as needed., Disp: 30 tablet, Rfl: 6   spironolactone (ALDACTONE) 25 MG tablet, Take 25 mg by mouth in the morning., Disp: , Rfl:    sulfamethoxazole-trimethoprim (BACTRIM DS) 800-160 MG tablet, Take 1 tablet by mouth 2 (two) times daily., Disp: 6 tablet, Rfl: 0   telmisartan (MICARDIS) 40 MG tablet, Take 40 mg by mouth in the morning., Disp: , Rfl:    traMADol (ULTRAM) 50 MG tablet, Take 1-2 tablets (50-100 mg total) by mouth every 6 (six) hours as needed for moderate pain., Disp: 20 tablet, Rfl: 0   vitamin E 180 MG (400 UNITS) capsule, Take 400 Units by mouth 3 (three) times a week., Disp: , Rfl:    zinc gluconate 50 MG tablet, Take 25 mg by mouth 3 (three) times a week., Disp: , Rfl:   Social History   Tobacco Use  Smoking Status Former   Packs/day: 0.88   Years: 40.00   Total pack years: 35.20   Types: Cigarettes   Quit date: 08/01/2015   Years since quitting: 6.7  Smokeless Tobacco Never    Allergies  Allergen Reactions   Casirivimab-Imdevimab Anaphylaxis    (REGN-COV)   Amlodipine Other (See Comments)    Muscle cramps   Citalopram Other (See Comments)    Felt like in a tunnel   Gabapentin Other (See Comments)    Felt like in a  tunnel   Pregabalin Other (See Comments)    Felt like in a tunnel   Statins     Cramping, hives   Tiotropium Other (See Comments)    Dizziness (SPIRIVA)   Objective:  There were no vitals filed for this visit. There is no height or weight on file to calculate BMI. Constitutional Well developed. Well nourished.  Vascular Dorsalis pedis pulses palpable bilaterally. Posterior tibial pulses palpable bilaterally. Capillary refill normal to all digits.  No cyanosis or clubbing noted. Pedal hair growth normal.  Neurologic Normal speech. Oriented to person, place, and time. Epicritic sensation to light touch grossly present bilaterally.  Dermatologic -Hyperkeratotic with central nucleated core noted to cysts submetatarsal 4.  Pain on palpation to the lesion pain with range of motion of the fourth MTP joint.  No intra-articular pain noted  Orthopedic: Normal joint ROM without pain or crepitus bilaterally. No visible deformities. No bony tenderness.   Radiographs: None Assessment:   1. Capsulitis of metatarsophalangeal (MTP) joint of right foot   2. Porokeratosis    Plan:  Patient was evaluated and treated and all questions answered.  Right submetatarsal 4 porokeratosis with underlying capsulitis -All questions and concerns were discussed with the patient in extensive detail -Given the amount of pain that she is having she will benefit from a steroid injection to help decrease acute inflammatory component associate with pain.  Patient agrees with the plan like to proceed with a steroid injection -A steroid injection was performed at right fourth MTP using 1% plain Lidocaine and 10 mg of Kenalog. This was well tolerated. -Also discussed shoe gear modification extensive discuss  No follow-ups on file.

## 2022-06-07 ENCOUNTER — Inpatient Hospital Stay: Payer: BC Managed Care – PPO | Attending: Obstetrics and Gynecology | Admitting: Obstetrics and Gynecology

## 2022-06-07 ENCOUNTER — Other Ambulatory Visit: Payer: Self-pay

## 2022-06-07 VITALS — BP 157/76 | HR 93 | Temp 98.7°F | Resp 19 | Wt 156.6 lb

## 2022-06-07 DIAGNOSIS — R102 Pelvic and perineal pain: Secondary | ICD-10-CM | POA: Insufficient documentation

## 2022-06-07 DIAGNOSIS — N8110 Cystocele, unspecified: Secondary | ICD-10-CM | POA: Insufficient documentation

## 2022-06-07 DIAGNOSIS — Z923 Personal history of irradiation: Secondary | ICD-10-CM | POA: Diagnosis not present

## 2022-06-07 DIAGNOSIS — Z8541 Personal history of malignant neoplasm of cervix uteri: Secondary | ICD-10-CM | POA: Diagnosis present

## 2022-06-07 DIAGNOSIS — Z87891 Personal history of nicotine dependence: Secondary | ICD-10-CM | POA: Insufficient documentation

## 2022-06-07 DIAGNOSIS — R300 Dysuria: Secondary | ICD-10-CM

## 2022-06-07 DIAGNOSIS — I10 Essential (primary) hypertension: Secondary | ICD-10-CM | POA: Insufficient documentation

## 2022-06-07 DIAGNOSIS — C774 Secondary and unspecified malignant neoplasm of inguinal and lower limb lymph nodes: Secondary | ICD-10-CM | POA: Diagnosis not present

## 2022-06-07 DIAGNOSIS — Z803 Family history of malignant neoplasm of breast: Secondary | ICD-10-CM | POA: Diagnosis not present

## 2022-06-07 DIAGNOSIS — Z801 Family history of malignant neoplasm of trachea, bronchus and lung: Secondary | ICD-10-CM | POA: Diagnosis not present

## 2022-06-07 LAB — URINALYSIS, COMPLETE (UACMP) WITH MICROSCOPIC
Bilirubin Urine: NEGATIVE
Glucose, UA: NEGATIVE mg/dL
Hgb urine dipstick: NEGATIVE
Ketones, ur: NEGATIVE mg/dL
Leukocytes,Ua: NEGATIVE
Nitrite: NEGATIVE
Protein, ur: NEGATIVE mg/dL
Specific Gravity, Urine: 1.014 (ref 1.005–1.030)
Squamous Epithelial / HPF: NONE SEEN (ref 0–5)
pH: 5 (ref 5.0–8.0)

## 2022-06-07 NOTE — Progress Notes (Signed)
Gynecologic Oncology Interval Visit   Referring Provider: Referred by Dr. Wende Bushy. Rogue Bussing   Chief Concern: History of squamous carcinoma noted in a R inguinal node, presumed cervical cancer.   Subjective:  Kristen Simon is a 71 y.o. female who is seen for continued surveillance for squamous carcinoma noted in a R inguinal node, presumed cervical cancer.    Last seen by Dr. Theora Gianotti in August 2022. Last Pap 10/24/2017 NILM/HPV  In interim she underwent robotic sacrocolpopexy and mesh sling placement with Dr. Louis Meckel for pelvic organ prolapse on 07/06/21. She had uncomplicated post op course and was discharged home on POD 2. She has been using vaginal estrogen twice a week.  A. UTERUS, BILATERAL FALLOPIAN TUBES AND OVARIES:      - Atrophic endometrium with cystic changes.      - Leiomyomata, the largest 1 measures 2.1 cm.      - Ovaries with simple cysts and corpus albicans.      - Unremarkable fallopian tubes.      - No evidence of hyperplasia or malignancy.   She is concerned she may have a UTI.   04/20/2021 PET  IMPRESSION: 1. No evidence local cervical cancer recurrence in  pelvis. 2. No evidence of metastatic adenopathy, visceral metastasis or skeletal metastasis   Gynecologic Oncology  History Kristen Simon is a very pleasant patient with a history of squamous carcinoma noted in a R inguinal node, presumed metastatic cervical cancer.   12/2011        squamous carcinoma noted in a R inguinal node,CT /  Pet scan without definite primary,              anal exam WNL, pelvic exam normal, but incomplete due to discomfort, ECC neg,   tissue evaluation c/w/ cervical primary,                   chemo-XRT, completed NED 05/2012  Dr. Sabra Heck in August 2015 negative exam and negative Pap smear.   Dr. Oliva Bustard March 2016 for a follow-up CT scan that was negative for evidence of recurrent disease.  10/21/2014 CT scan Abdomen and pelvis IMPRESSION: 1. No evidence of recurrent  or metastatic disease. 2. Question mild hepatic steatosis. 3. Suspect a 2.5 cm uterine fibroid.  October 2016:Dr. Choksi for a follow-up and had a negative exam including breast exam. Her mammogram was negative.   12/08/2015 Pap NILM; HPV negative  12/17/2015 CT scan IMPRESSION: No evidence of metastatic disease.  Uterine fibroid. Hepatic steatosis.  01/14/2016: Kristen Nim NP  smoking cessation and lung cancer screening reviewed. She has quit smoking.   01/14/2016 CT Chest Lung Cancer Screening IMPRESSION: 1. Lung-Rads category 3, probably benign findings. Short-term follow-up in 6 months is recommended with repeat low-dose chest CT without contrast (please use the following order, "CT CHEST LCS NODULE FOLLOW-UP W/O CM"). 2. Mild diffuse bronchial wall thickening with mild centrilobular and paraseptal emphysema; imaging findings suggestive of underlying COPD. 3. Hepatic steatosis.  01/09/2017 PET scan IMPRESSION: 1. No findings of active malignancy in the neck, chest, abdomen, or pelvis. No definite hypermetabolic cervical mass is readily apparent. 2. Emphysema. 3. Old granulomatous disease. 4. Diffuse hepatic steatosis.  She was released from the clinic in 2019 and was seeing Dr. Leonides Schanz who recently left practice. She reports symptoms consistent with uterine prolapse and requested appointment. She has a h/o chronic pain but has noticed increased more severe pain located in the lower pelvic areas and associated with nausea over the  past week. Last Pap 10/24/2017 NILM/HPV negative.   CT Angio Chest 10/29/2020 IMPRESSION: 1. No evidence of pulmonary embolus. 2. No acute intrathoracic process. 3. Aortic Atherosclerosis (ICD10-I70.0) and Emphysema (ICD10-J43.9).   Problem List: Patient Active Problem List   Diagnosis Date Noted   Pelvic prolapse 07/06/2021   Prolapse of female pelvic organs 07/06/2021   Pneumonia due to COVID-19 virus 07/22/2020   Respiratory failure, acute ([place])  07/22/2020   Anaphylaxis 07/21/2020   Long term current use of opiate analgesic 03/19/2017   Long term prescription opiate use 03/19/2017   Opiate use 03/19/2017   Chronic pain syndrome 03/19/2017   Chronic low back pain, unspecified back pain laterality, with sciatica presence unspecified (primary) (bilateral) (R>L) 03/19/2017   Chronic neck pain 03/19/2017   Pain in both lower extremities (tertiary)(R>L) 03/19/2017   Bilateral groin pain (secondary) (R>L) 03/19/2017   Sacroiliac joint pain 03/19/2017   Primary cervical cancer ([place]) 12/29/2016   Personal history of tobacco use, presenting hazards to health 01/13/2016   Benign essential HTN 01/29/2015   Mixed hyperlipidemia 01/29/2015   Shortness of breath 01/29/2015   Cough 01/02/2014   Secondary malignant neoplasm of lymph nodes of inguinal region or lower extremity ([place])    History of cancer chemotherapy     Past Medical History: Past Medical History:  Diagnosis Date   Allergy    Anemia    Arthritis    Atherosclerotic peripheral vascular disease ([place])    Breast screening, unspecified    COPD (chronic obstructive pulmonary disease) ([place])    Coronary artery disease    COVID-19    Dysphagia    Dyspnea    With exertion   Elevated cholesterol    Fatty liver    GERD (gastroesophageal reflux disease)    Groin mass    History of basal cell carcinoma (BCC) 01/24/2017   right cheek zygoma   History of cancer chemotherapy 2013   History of rectal bleeding    Hx of radiation therapy 2013   Hypertension 2010   Internal hemorrhoids with other complication 1696   Lymphoma ([place])    Personal history of chemotherapy    Personal history of radiation therapy    Personal history of tobacco use, presenting hazards to health    Pneumonia    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    lymphoma 2013   Stress incontinence    Stress incontinence due to pelvic organ prolapse    Ulcer  1970   stomach-resolved   Unilateral or unspecified femoral hernia with obstruction     Past Surgical History: Past Surgical History:  Procedure Laterality Date   ANOSCOPY  03/27/2012   anal biopsy/hemorrhoid-benign   APPENDECTOMY  1970   COLONOSCOPY WITH PROPOFOL N/A 09/30/2018   Procedure: COLONOSCOPY WITH PROPOFOL;  Surgeon: Manya Silvas, MD;  Location: [facility] ENDOSCOPY;  Service: Endoscopy;  Laterality: N/A;   CYSTOSCOPY WITH STENT PLACEMENT N/A 07/06/2021   Procedure: CYSTOSCOPY;  Surgeon: Ardis Hughs, MD;  Location: [facility] ORS;  Service: Urology;  Laterality: N/A;   ESOPHAGOGASTRODUODENOSCOPY N/A 09/30/2018   Procedure: ESOPHAGOGASTRODUODENOSCOPY (EGD);  Surgeon: Manya Silvas, MD;  Location: [facility] ENDOSCOPY;  Service: Endoscopy;  Laterality: N/A;   FRACTURE SURGERY  [phone]   right wrist,right leg   GROIN MASS OPEN BIOPSY Right 01/24/2012   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   HERNIA REPAIR Right 01/10/2012   right femerol hernia   PATELLA FRACTURE  SURGERY Right 1982   PUBOVAGINAL SLING N/A 07/06/2021   Procedure: MID URETHERAL SLING;  Surgeon: Ardis Hughs, MD;  Location: [facility] ORS;  Service: Urology;  Laterality: N/A;   ROBOTIC ASSISTED LAPAROSCOPIC SACROCOLPOPEXY Bilateral 07/06/2021   Procedure: XI ROBOTIC ASSISTED LAPAROSCOPIC SACROCOLPOPEXY/SUPRACERVICAL  HYSTERECTOMY AND BILATERAL SALPINGO OOPHERECTOMY;  Surgeon: Ardis Hughs, MD;  Location: [facility] ORS;  Service: Urology;  Laterality: Bilateral;   SKIN CANCER EXCISION     WRIST SURGERY Right     Past Gynecologic History:   Gravida 1   Para 1   Age at Menarche 61   Age at Menopause 62   Regular Pap Smears Yes   Additional Hx patient does not recall any gyn problems in the past   OB History:  OB History  Obstetric Comments  Age with first menstruation-9  LMP-age 44    Family History: Family History  Problem Relation Age of Onset   Cancer  Mother        lung   Breast cancer Maternal Aunt     Social History: Social History   Socioeconomic History   Marital status: Married    Spouse name: Not on file   Number of children: Not on file   Years of education: Not on file   Highest education level: Not on file  Occupational History   Not on file  Tobacco Use   Smoking status: Former    Packs/day: 0.88    Years: 40.00    Total pack years: 35.20    Types: Cigarettes    Quit date: 08/01/2015    Years since quitting: 6.8   Smokeless tobacco: Never  Vaping Use   Vaping Use: Never used  Substance and Sexual Activity   Alcohol use: Not Currently   Drug use: No   Sexual activity: Never    Birth control/protection: None, Post-menopausal  Other Topics Concern   Not on file  Social History Narrative   Not on file   Social Determinants of Health   Financial Resource Strain: Not on file  Food Insecurity: Not on file  Transportation Needs: Not on file  Physical Activity: Not on file  Stress: Not on file  Social Connections: Not on file  Intimate Partner Violence: Not on file    Allergies: Allergies  Allergen Reactions   Casirivimab-Imdevimab Anaphylaxis    (REGN-COV)   Amlodipine Other (See Comments)    Muscle cramps   Citalopram Other (See Comments)    Felt like in a tunnel   Gabapentin Other (See Comments)    Felt like in a tunnel   Pregabalin Other (See Comments)    Felt like in a tunnel   Statins     Cramping, hives   Tiotropium Other (See Comments)    Dizziness (SPIRIVA)    Current Medications: Current Outpatient Medications  Medication Sig Dispense Refill   acetaminophen (TYLENOL) 650 MG CR tablet Take 650-1,300 mg by mouth 2 (two) times daily as needed for pain.     albuterol (PROVENTIL HFA;VENTOLIN HFA) 108 (90 BASE) MCG/ACT inhaler Inhale into the lungs every 6 (six) hours as needed for wheezing or shortness of breath.     albuterol (PROVENTIL) (2.5 MG/3ML) 0.083% nebulizer solution Take 2.5  mg by nebulization every 6 (six) hours as needed for wheezing or shortness of breath (COPD).     Ascorbic Acid (VITAMIN C) 1000 MG tablet Take 1,000 mg by mouth 3 (three) times a week.     aspirin EC 81 MG tablet  Take 81 mg by mouth 2 (two) times a week. Swallow whole.     baclofen (LIORESAL) 10 MG tablet TAKE 1 TABLET BY MOUTH EVERY 12 HOURS AS DIRECTED     Baclofen 5 MG TABS Take 5 mg by mouth 2 (two) times daily as needed (spasms).     benzonatate (TESSALON) 200 MG capsule Take 200 mg by mouth 3 (three) times daily as needed for cough.     Biotin 5 MG CAPS Take 1 capsule (5 mg total) by mouth daily.  0   budesonide (PULMICORT) 1 MG/2ML nebulizer solution USE 1 VIAL IN NEBULIZER ONCE DAILY     budesonide-formoterol (SYMBICORT) 80-4.5 MCG/ACT inhaler Inhale 1 puff into the lungs in the morning and at bedtime.     Cholecalciferol (VITAMIN D3) 125 MCG (5000 UT) TABS Take 5,000 Units by mouth 3 (three) times a week.     cyanocobalamin 1000 MCG tablet Take 1,000 mcg by mouth daily.     estradiol (ESTRACE VAGINAL) 0.1 MG/GM vaginal cream Use 3 nights a week-apply a pea size amount (Patient taking differently: Place 1 Applicatorful vaginally 2 (two) times a week. Use 2 nights a week-apply a pea size amount) 42.5 g 12   famotidine (PEPCID) 20 MG tablet Take 1 tablet (20 mg total) by mouth 2 (two) times daily. (Patient taking differently: Take 20 mg by mouth 2 (two) times daily as needed for indigestion or heartburn.) 30 tablet 0   famotidine (PEPCID) 20 MG tablet Take 1 tablet by mouth 2 (two) times daily.     fluticasone (FLONASE) 50 MCG/ACT nasal spray Place 2 sprays into the nose daily as needed (allergies/congestion.).     Lactobacillus (ACIDOPHILUS) CAPS capsule Take by mouth.     Lactobacillus ([employer] PROBIOTIC ACIDOPHILUS) 10 MG CAPS Take 1 capsule by mouth 3 (three) times a week.     meclizine (ANTIVERT) 25 MG tablet Take 25 mg by mouth 3 (three) times daily as needed (lightheaded/nausea/dizzy).      montelukast (SINGULAIR) 10 MG tablet Take 10 mg by mouth at bedtime.     montelukast (SINGULAIR) 10 MG tablet Take 1 tablet by mouth at bedtime.     Multiple Minerals-Vitamins (CAL MAG ZINC +D3 PO) Take 1 tablet by mouth 3 (three) times a week. At night     olopatadine (PATANOL) 0.1 % ophthalmic solution Place 1 drop into both eyes 2 (two) times daily.     Omega-3 Fatty Acids (RA FISH OIL) 1000 MG CAPS Take 1,000 mg by mouth 3 (three) times a week.     ondansetron (ZOFRAN-ODT) 4 MG disintegrating tablet ondansetron 4 mg disintegrating tablet  DISSOLVE 1 TABLET IN MOUTH EVERY 6 HOURS AS NEEDED FOR NAUSEA     polyethylene glycol (MIRALAX / GLYCOLAX) 17 g packet Take 17 g by mouth daily as needed (constipation.).     pregabalin (LYRICA) 25 MG capsule Take by oral route for 30 days.     promethazine-dextromethorphan (PROMETHAZINE-DM) 6.25-15 MG/5ML syrup promethazine-DM 6.25 mg-15 mg/5 mL oral syrup  TAKE 5 ML BY MOUTH EVERY 6 HOURS AS NEEDED     senna-docusate (SENOKOT-S) 8.6-50 MG tablet Take 1 tablet by mouth at bedtime as needed. 30 tablet 6   spironolactone (ALDACTONE) 25 MG tablet Take 25 mg by mouth in the morning.     sulfamethoxazole-trimethoprim (BACTRIM DS) 800-160 MG tablet Take 1 tablet by mouth 2 (two) times daily. 6 tablet 0   traMADol (ULTRAM) 50 MG tablet Take 1-2 tablets (50-100 mg total) by  mouth every 6 (six) hours as needed for moderate pain. 20 tablet 0   traMADol (ULTRAM) 50 MG tablet Take 1 tablet by mouth 2 (two) times daily.     triamcinolone cream (KENALOG) 0.1 %      vitamin E 180 MG (400 UNITS) capsule Take 400 Units by mouth 3 (three) times a week.     zinc gluconate 50 MG tablet Take 25 mg by mouth 3 (three) times a week.     amoxicillin-clavulanate (AUGMENTIN) 875-125 MG tablet amoxicillin 875 mg-potassium clavulanate 125 mg tablet  TAKE 1 TABLET BY MOUTH EVERY 12 HOURS FOR 7 DAYS (Patient not taking: Reported on 06/07/2022)     carvedilol (COREG) 6.25 MG tablet Take  6.25 mg by mouth 2 (two) times daily. (Patient not taking: Reported on 06/07/2022)     telmisartan (MICARDIS) 40 MG tablet Take 40 mg by mouth in the morning.     No current facility-administered medications for this visit.   Review of Systems General:  fatigue, weakness Skin: no complaints Eyes: no complaints HEENT: no complaints Breasts: no complaints Pulmonary: shortness of breath, cough Cardiac: no complaints Gastrointestinal: abdominal pain Genitourinary/Sexual: possible UTI, o/w no complaints Ob/Gyn: pelvic pain Musculoskeletal: no complaints Hematology: no complaints Neurologic/Psych: no complaints   Objective:  Physical Examination:  BP (!) 157/76   Pulse 93   Temp 98.7 F (37.1 C)   Resp 19   Wt 156 lb 9.6 oz (71 kg)   SpO2 97%   BMI 28.64 kg/m    Body mass index is 28.64 kg/m.  ECOG PS: 1   GENERAL: Patient is a well appearing female in no acute distress HEENT: atraumatic and normocephalic NODES:  Palpable firm right inguinal node, no left inguinal lymphadenopathy palpated.  CV: regular rate and rhythm Lungs: bilateral clear to auscultation ABDOMEN:  Soft, rotund protruding abdomen with tenderness to palpation in BLQ.  No masses or hernias or ascites  EXTREMITIES:  No peripheral edema.   NEURO:  Nonfocal. Well oriented.  Appropriate affect.  Pelvic: Exam chaperoned by nursing EGBUS: no lesions Bladder/urethra: normal urethra; bladder prolapse improved Cervix: no lesions, nontender, located right lower aspect upper vagina Vagina: no lesions, no discharge or bleeding Uterus: surgically absent BME: no palpable masses  Rectovaginal: deferred  Lab Review No labs on site today   Radiologic Imaging: As noted per hpi    Assessment:  RAYLAN TROIANI is a 71 y.o. female diagnosed with  squamous carcinoma noted in a R inguinal node, presumed cervical cancer s/p chemotherapy and radiation, clinically NED.   Pelvic organ prolapse with cystocele s/p  sacral colpopexy with improvement in symptoms.   Possible UTI.   Lung-Rads category 3. Medical co-morbidities complicating care:  history of tobacco use, now stopped.  Overweight.  Plan:   Follow up UA and culture with sensitivities.   Prior Pap normal. We previously discussed the questionable utility of Pap given the uncertainty whether the cervix was even the primary site of disease and limited utility of Pap for cervical cancer surveillance. We previously discussed that after 5 years we can probably extend Pap intervals to 3-5 years if we continue. Typically Pap smears are followed for 20 years post diagnosis of cervical cancer. Plan for Pap next year.   Follow up in one year.   The patient's diagnosis, an outline of the further diagnostic and laboratory studies which will be required, the recommendation, and alternatives were discussed.  All questions were answered to the patient's satisfaction.  A  total of at least 40 minutes were spent with the patient/family today; >50% was spent in education, counseling and coordination of care for h/o squamous carcinoma noted in a R inguinal node, presumed cervical cancer s/p  Chemotherapy and radiation with symptoms concerning for recurrence and pelvic organ prolapse.   I personally had a face to face interaction and evaluated the patient jointly with the NP, Ms. Beckey Rutter.  I have reviewed her history and available records and have performed the key portions of the physical exam including lymph node survey, abdominal exam, pelvic exam with my findings confirming those documented above by the APP.  I have discussed the case with the APP and the patient.  I agree with the above documentation, assessment and plan which was fully formulated by me.  Counseling was completed by me.   I personally saw the patient and performed a substantive portion of this encounter in conjunction with the listed APP as documented above.  Rea Reser Gaetana Michaelis,  MD

## 2022-06-08 ENCOUNTER — Ambulatory Visit (INDEPENDENT_AMBULATORY_CARE_PROVIDER_SITE_OTHER): Payer: BC Managed Care – PPO | Admitting: Dermatology

## 2022-06-08 DIAGNOSIS — Z85828 Personal history of other malignant neoplasm of skin: Secondary | ICD-10-CM | POA: Diagnosis not present

## 2022-06-08 DIAGNOSIS — L578 Other skin changes due to chronic exposure to nonionizing radiation: Secondary | ICD-10-CM

## 2022-06-08 DIAGNOSIS — L738 Other specified follicular disorders: Secondary | ICD-10-CM

## 2022-06-08 DIAGNOSIS — L821 Other seborrheic keratosis: Secondary | ICD-10-CM

## 2022-06-08 DIAGNOSIS — D239 Other benign neoplasm of skin, unspecified: Secondary | ICD-10-CM

## 2022-06-08 DIAGNOSIS — D23122 Other benign neoplasm of skin of left lower eyelid, including canthus: Secondary | ICD-10-CM

## 2022-06-08 DIAGNOSIS — D2239 Melanocytic nevi of other parts of face: Secondary | ICD-10-CM

## 2022-06-08 DIAGNOSIS — D229 Melanocytic nevi, unspecified: Secondary | ICD-10-CM

## 2022-06-08 NOTE — Progress Notes (Signed)
   Follow-Up Visit   Subjective  Kristen Simon is a 71 y.o. female who presents for the following: Irregular skin lesions (On the R lat canthus x 2 - pt is concerned they may be cancer since she has a hx of BCC). The patient has spots, moles and lesions to be evaluated, some may be new or changing and the patient has concerns that these could be cancer.  The following portions of the chart were reviewed this encounter and updated as appropriate:   Tobacco  Allergies  Meds  Problems  Med Hx  Surg Hx  Fam Hx     Review of Systems:  No other skin or systemic complaints except as noted in HPI or Assessment and Plan.  Objective  Well appearing patient in no apparent distress; mood and affect are within normal limits.  A focused examination was performed including the face. Relevant physical exam findings are noted in the Assessment and Plan.  R lat lower eyelid 0.2 cm fluid filled papule.       R zygoma 0.4 cm light brown papule.   Assessment & Plan  Hidrocystoma R lat lower eyelid See photo Benign-appearing.  Recommend observation for change.  Call clinic for appointment for evaluation of any new or changing lesions. Discussed treatment/removal. Pt declines today.  Nevus R zygoma See photo Benign-appearing.  Observation.  Call clinic for new or changing moles.  Recommend daily use of broad spectrum spf 30+ sunscreen to sun-exposed areas.   Actinic Damage - chronic, secondary to cumulative UV radiation exposure/sun exposure over time - diffuse scaly erythematous macules with underlying dyspigmentation - Recommend daily broad spectrum sunscreen SPF 30+ to sun-exposed areas, reapply every 2 hours as needed.  - Recommend staying in the shade or wearing long sleeves, sun glasses (UVA+UVB protection) and wide brim hats (4-inch brim around the entire circumference of the hat). - Call for new or changing lesions.  Seborrheic Keratoses - Stuck-on, waxy, tan-brown  papules and/or plaques  - Benign-appearing - Discussed benign etiology and prognosis. - Observe - Call for any changes  History of Basal Cell Carcinoma of the Skin - R cheek zygoma 01/24/2017 - No evidence of recurrence today - Recommend regular full body skin exams - Recommend daily broad spectrum sunscreen SPF 30+ to sun-exposed areas, reapply every 2 hours as needed.  - Call if any new or changing lesions are noted between office visits  Sebaceous Hyperplasia - R upper med eyelid - Small yellow papules with a central dell - Benign - Observe  Return for appointment as scheduled.  Luther Redo, CMA, am acting as scribe for Sarina Ser, MD . Documentation: I have reviewed the above documentation for accuracy and completeness, and I agree with the above.  Sarina Ser, MD

## 2022-06-08 NOTE — Patient Instructions (Signed)
Due to recent changes in healthcare laws, you may see results of your pathology and/or laboratory studies on MyChart before the doctors have had a chance to review them. We understand that in some cases there may be results that are confusing or concerning to you. Please understand that not all results are received at the same time and often the doctors may need to interpret multiple results in order to provide you with the best plan of care or course of treatment. Therefore, we ask that you please give us 2 business days to thoroughly review all your results before contacting the office for clarification. Should we see a critical lab result, you will be contacted sooner.   If You Need Anything After Your Visit  If you have any questions or concerns for your doctor, please call our main line at [phone] and press option 4 to reach your doctor's medical assistant. If no one answers, please leave a voicemail as directed and we will return your call as soon as possible. Messages left after 4 pm will be answered the following business day.   You may also send us a message via MyChart. We typically respond to MyChart messages within 1-2 business days.  For prescription refills, please ask your pharmacy to contact our office. Our fax number is [phone].  If you have an urgent issue when the clinic is closed that cannot wait until the next business day, you can page your doctor at the number below.    Please note that while we do our best to be available for urgent issues outside of office hours, we are not available 24/7.   If you have an urgent issue and are unable to reach us, you may choose to seek medical care at your doctor's office, retail clinic, urgent care center, or emergency room.  If you have a medical emergency, please immediately call 911 or go to the emergency department.  Pager Numbers  - Dr. Kowalski: [phone]  - Dr. Moye: [phone]  - Dr. Stewart:  [phone]  In the event of inclement weather, please call our main line at [phone] for an update on the status of any delays or closures.  Dermatology Medication Tips: Please keep the boxes that topical medications come in in order to help keep track of the instructions about where and how to use these. Pharmacies typically print the medication instructions only on the boxes and not directly on the medication tubes.   If your medication is too expensive, please contact our office at [phone] option 4 or send us a message through MyChart.   We are unable to tell what your co-pay for medications will be in advance as this is different depending on your insurance coverage. However, we may be able to find a substitute medication at lower cost or fill out paperwork to get insurance to cover a needed medication.   If a prior authorization is required to get your medication covered by your insurance company, please allow us 1-2 business days to complete this process.  Drug prices often vary depending on where the prescription is filled and some pharmacies may offer cheaper prices.  The website www.goodrx.com contains coupons for medications through different pharmacies. The prices here do not account for what the cost may be with help from insurance (it may be cheaper with your insurance), but the website can give you the price if you did not use any insurance.  - You can print the associated coupon and take it with   your prescription to the pharmacy.  - You may also stop by our office during regular business hours and pick up a GoodRx coupon card.  - If you need your prescription sent electronically to a different pharmacy, notify our office through [facility] MyChart or by phone at [phone] option 4.     Si Usted Necesita Algo Despus de Su Visita  Tambin puede enviarnos un mensaje a travs de MyChart. Por lo general respondemos a los mensajes de MyChart en el transcurso de 1 a 2  das hbiles.  Para renovar recetas, por favor pida a su farmacia que se ponga en contacto con nuestra oficina. Nuestro nmero de fax es el [phone].  Si tiene un asunto urgente cuando la clnica est cerrada y que no puede esperar hasta el siguiente da hbil, puede llamar/localizar a su doctor(a) al nmero que aparece a continuacin.   Por favor, tenga en cuenta que aunque hacemos todo lo posible para estar disponibles para asuntos urgentes fuera del horario de oficina, no estamos disponibles las 24 horas del da, los 7 das de la semana.   Si tiene un problema urgente y no puede comunicarse con nosotros, puede optar por buscar atencin mdica  en el consultorio de su doctor(a), en una clnica privada, en un centro de atencin urgente o en una sala de emergencias.  Si tiene una emergencia mdica, por favor llame inmediatamente al 911 o vaya a la sala de emergencias.  Nmeros de bper  - Dr. Kowalski: [phone]  - Dra. Moye: [phone]  - Dra. Stewart: [phone]  En caso de inclemencias del tiempo, por favor llame a nuestra lnea principal al [phone] para una actualizacin sobre el estado de cualquier retraso o cierre.  Consejos para la medicacin en dermatologa: Por favor, guarde las cajas en las que vienen los medicamentos de uso tpico para ayudarle a seguir las instrucciones sobre dnde y cmo usarlos. Las farmacias generalmente imprimen las instrucciones del medicamento slo en las cajas y no directamente en los tubos del medicamento.   Si su medicamento es muy caro, por favor, pngase en contacto con nuestra oficina llamando al [phone] y presione la opcin 4 o envenos un mensaje a travs de MyChart.   No podemos decirle cul ser su copago por los medicamentos por adelantado ya que esto es diferente dependiendo de la cobertura de su seguro. Sin embargo, es posible que podamos encontrar un medicamento sustituto a menor costo o llenar un formulario para que el  seguro cubra el medicamento que se considera necesario.   Si se requiere una autorizacin previa para que su compaa de seguros cubra su medicamento, por favor permtanos de 1 a 2 das hbiles para completar este proceso.  Los precios de los medicamentos varan con frecuencia dependiendo del lugar de dnde se surte la receta y alguna farmacias pueden ofrecer precios ms baratos.  El sitio web www.goodrx.com tiene cupones para medicamentos de diferentes farmacias. Los precios aqu no tienen en cuenta lo que podra costar con la ayuda del seguro (puede ser ms barato con su seguro), pero el sitio web puede darle el precio si no utiliz ningn seguro.  - Puede imprimir el cupn correspondiente y llevarlo con su receta a la farmacia.  - Tambin puede pasar por nuestra oficina durante el horario de atencin regular y recoger una tarjeta de cupones de GoodRx.  - Si necesita que su receta se enve electrnicamente a una farmacia diferente, informe a nuestra oficina a travs de MyChart de [facility]   o por telfono llamando al [phone] y presione la opcin 4.  

## 2022-06-09 ENCOUNTER — Telehealth: Payer: Self-pay

## 2022-06-09 LAB — URINE CULTURE: Culture: NO GROWTH

## 2022-06-09 NOTE — Telephone Encounter (Signed)
Called and notified of negative urine culture.

## 2022-06-21 ENCOUNTER — Encounter: Payer: Self-pay | Admitting: Dermatology

## 2022-06-26 ENCOUNTER — Other Ambulatory Visit: Payer: Self-pay | Admitting: Family Medicine

## 2022-06-26 DIAGNOSIS — Z1231 Encounter for screening mammogram for malignant neoplasm of breast: Secondary | ICD-10-CM

## 2022-06-27 ENCOUNTER — Ambulatory Visit
Admission: RE | Admit: 2022-06-27 | Discharge: 2022-06-27 | Disposition: A | Discharge status: Home / Self Care | Payer: BC Managed Care – PPO | Source: Ambulatory Visit | Attending: Family Medicine | Admitting: Family Medicine

## 2022-06-27 DIAGNOSIS — Z1231 Encounter for screening mammogram for malignant neoplasm of breast: Secondary | ICD-10-CM | POA: Diagnosis present

## 2022-08-25 ENCOUNTER — Other Ambulatory Visit
Admission: RE | Admit: 2022-08-25 | Discharge: 2022-08-25 | Disposition: A | Discharge status: Home / Self Care | Payer: BC Managed Care – PPO | Source: Ambulatory Visit | Attending: Family Medicine | Admitting: Family Medicine

## 2022-08-25 ENCOUNTER — Other Ambulatory Visit: Payer: Self-pay

## 2022-08-25 ENCOUNTER — Emergency Department: Payer: BC Managed Care – PPO

## 2022-08-25 ENCOUNTER — Emergency Department
Admission: EM | Admit: 2022-08-25 | Discharge: 2022-08-25 | Disposition: A | Discharge status: Home / Self Care | Payer: BC Managed Care – PPO | Attending: Emergency Medicine | Admitting: Emergency Medicine

## 2022-08-25 DIAGNOSIS — R0602 Shortness of breath: Secondary | ICD-10-CM | POA: Insufficient documentation

## 2022-08-25 DIAGNOSIS — Z20822 Contact with and (suspected) exposure to covid-19: Secondary | ICD-10-CM | POA: Diagnosis not present

## 2022-08-25 DIAGNOSIS — J449 Chronic obstructive pulmonary disease, unspecified: Secondary | ICD-10-CM | POA: Diagnosis not present

## 2022-08-25 DIAGNOSIS — J22 Unspecified acute lower respiratory infection: Secondary | ICD-10-CM | POA: Insufficient documentation

## 2022-08-25 DIAGNOSIS — R059 Cough, unspecified: Secondary | ICD-10-CM | POA: Insufficient documentation

## 2022-08-25 DIAGNOSIS — I10 Essential (primary) hypertension: Secondary | ICD-10-CM | POA: Diagnosis not present

## 2022-08-25 DIAGNOSIS — R0981 Nasal congestion: Secondary | ICD-10-CM | POA: Diagnosis not present

## 2022-08-25 LAB — RESP PANEL BY RT-PCR (RSV, FLU A&B, COVID)  RVPGX2
Influenza A by PCR: NEGATIVE
Influenza B by PCR: NEGATIVE
Resp Syncytial Virus by PCR: NEGATIVE
SARS Coronavirus 2 by RT PCR: NEGATIVE

## 2022-08-25 LAB — CBC
HCT: 39.9 % (ref 36.0–46.0)
Hemoglobin: 13.1 g/dL (ref 12.0–15.0)
MCH: 30 pg (ref 26.0–34.0)
MCHC: 32.8 g/dL (ref 30.0–36.0)
MCV: 91.3 fL (ref 80.0–100.0)
Platelets: 287 10*3/uL (ref 150–400)
RBC: 4.37 MIL/uL (ref 3.87–5.11)
RDW: 12.7 % (ref 11.5–15.5)
WBC: 6.6 10*3/uL (ref 4.0–10.5)
nRBC: 0 % (ref 0.0–0.2)

## 2022-08-25 LAB — BASIC METABOLIC PANEL
Anion gap: 9 (ref 5–15)
BUN: 12 mg/dL (ref 8–23)
CO2: 27 mmol/L (ref 22–32)
Calcium: 9.4 mg/dL (ref 8.9–10.3)
Chloride: 104 mmol/L (ref 98–111)
Creatinine, Ser: 0.79 mg/dL (ref 0.44–1.00)
GFR, Estimated: >60 mL/min (ref >60)
Glucose, Bld: 105 mg/dL — ABNORMAL HIGH (ref 70–99)
Potassium: 4.2 mmol/L (ref 3.5–5.1)
Sodium: 140 mmol/L (ref 135–145)

## 2022-08-25 LAB — D-DIMER, QUANTITATIVE: D-Dimer, Quant: 1.14 ug/mL-FEU — ABNORMAL HIGH (ref 0.00–0.50)

## 2022-08-25 MED ORDER — IOHEXOL 350 MG/ML SOLN
75.0000 mL | Freq: Once | INTRAVENOUS | Status: AC | PRN
  Administered 2022-08-25: 75 mL via INTRAVENOUS

## 2022-08-25 NOTE — ED Provider Notes (Signed)
[facility] Provider Note    Event Date/Time   First MD Initiated Contact with Patient 08/25/22 1512     (approximate)  History   Chief Complaint: Shortness of Breath  HPI  Kristen Simon is a 71 y.o. female with a past medical history of anemia, arthritis, COPD, gastric reflux, hypertension, presents to the emergency department for shortness of breath.  According to the patient for the past 2 weeks she has been feeling shortness of breath as well as cough and congestion.  Patient states cough and congestion have largely resolved but she continues to have some shortness of breath.  Patient saw her doctor and was called back today saying that her D-dimer blood test came back positive and she needed a CAT scan and to go to the emergency department.  Here the patient appears well, reassuring vital signs satting in the mid upper 90s on room air.  Patient is somewhat hypertensive.  Physical Exam   Triage Vital Signs: ED Triage Vitals  Enc Vitals Group     BP 08/25/22 1057 (!) 181/81     Pulse Rate 08/25/22 1057 75     Resp 08/25/22 1057 16     Temp 08/25/22 1057 98 F (36.7 C)     Temp Source 08/25/22 1057 Oral     SpO2 08/25/22 1057 96 %     Weight --      Height --      Head Circumference --      Peak Flow --      Pain Score 08/25/22 1114 0     Pain Loc --      Pain Edu? --      Excl. in [place]? --     Most recent vital signs: Vitals:   08/25/22 1057  BP: (!) 181/81  Pulse: 75  Resp: 16  Temp: 98 F (36.7 C)  SpO2: 96%    General: Awake, no distress.  CV:  Good peripheral perfusion.  Regular rate and rhythm  Resp:  Normal effort.  Equal breath sounds bilaterally.  No wheeze rales or rhonchi. Abd:  No distention.  Soft, nontender.  No rebound or guarding.   ED Results / Procedures / Treatments   EKG  EKG shows a normal sinus rhythm at 80 bpm with a narrow QRS, normal axis, normal intervals, no concerning ST changes.  RADIOLOGY  I  have reviewed and interpreted the chest x-ray images.  No consolidation seen on my evaluation. Radiology is read the chest x-ray as negative.   MEDICATIONS ORDERED IN ED: Medications  iohexol (OMNIPAQUE) 350 MG/ML injection 75 mL (75 mLs Intravenous Contrast Given 08/25/22 1648)     IMPRESSION / MDM / ASSESSMENT AND PLAN / ED COURSE  I reviewed the triage vital signs and the nursing notes.  Patient's presentation is most consistent with acute presentation with potential threat to life or bodily function.  Patient presents emergency department for 2 weeks of shortness of breath cough congestion although states her symptoms are resolving.  She saw her doctor today ordered a D-dimer that resulted positive and was told to come to the emergency department.  Here the patient appears well, workup is reassuring including a clear chest x-ray, reassuring EKG normal chemistry normal CBC and a negative flu/COVID/RSV test.  However given the patient's elevated D-dimer we will proceed with a CTA of the chest to rule out pulmonary embolism or occult pneumonia.  Patient agreeable to plan of care.  CTA of  the chest has resulted negative for PE or other acute finding.  Some reactive lymph nodes.  Given the patient is negative CT scan highly suspect resolving viral illness.  Patient will follow-up with her doctor.  Patient reassured by today's workup.  FINAL CLINICAL IMPRESSION(S) / ED DIAGNOSES   Dyspnea   Note:  This document was prepared using Dragon voice recognition software and may include unintentional dictation errors.   Harvest Dark, MD 08/25/22 [phone]

## 2022-08-25 NOTE — ED Triage Notes (Signed)
Pt comes with c/o increased sob for few days. Pt has copd. Pt did also have elevated dimer test and was sent here for further evaluation. Pt states now it is worse when she is walking and if she bends down.

## 2022-11-06 IMAGING — MG MM DIGITAL SCREENING BILAT W/ TOMO AND CAD
6 of 10 series · 6 of 30 positions shown · non-contrast
Comparison: Previous exam(s).

CLINICAL DATA: Screening.

EXAM:
DIGITAL SCREENING BILATERAL MAMMOGRAM WITH TOMO AND CAD

[L CC synth-2D (1 of 2)]
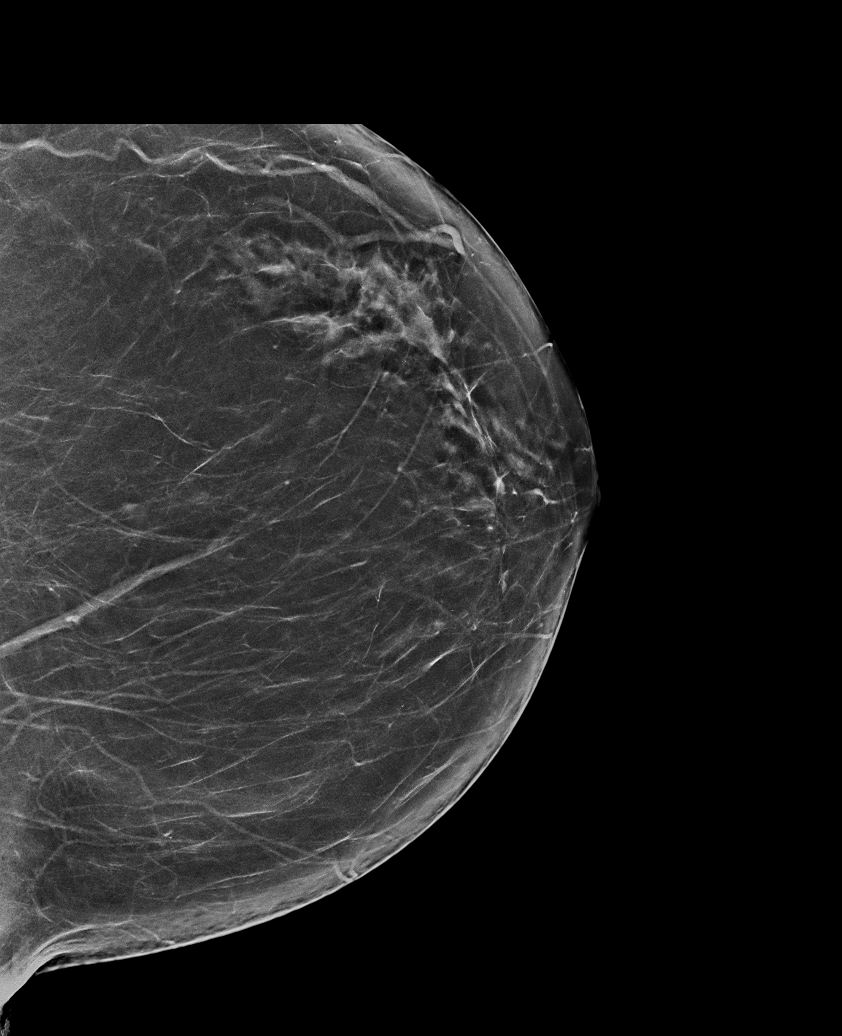

[L CC synth-2D (2 of 2)]
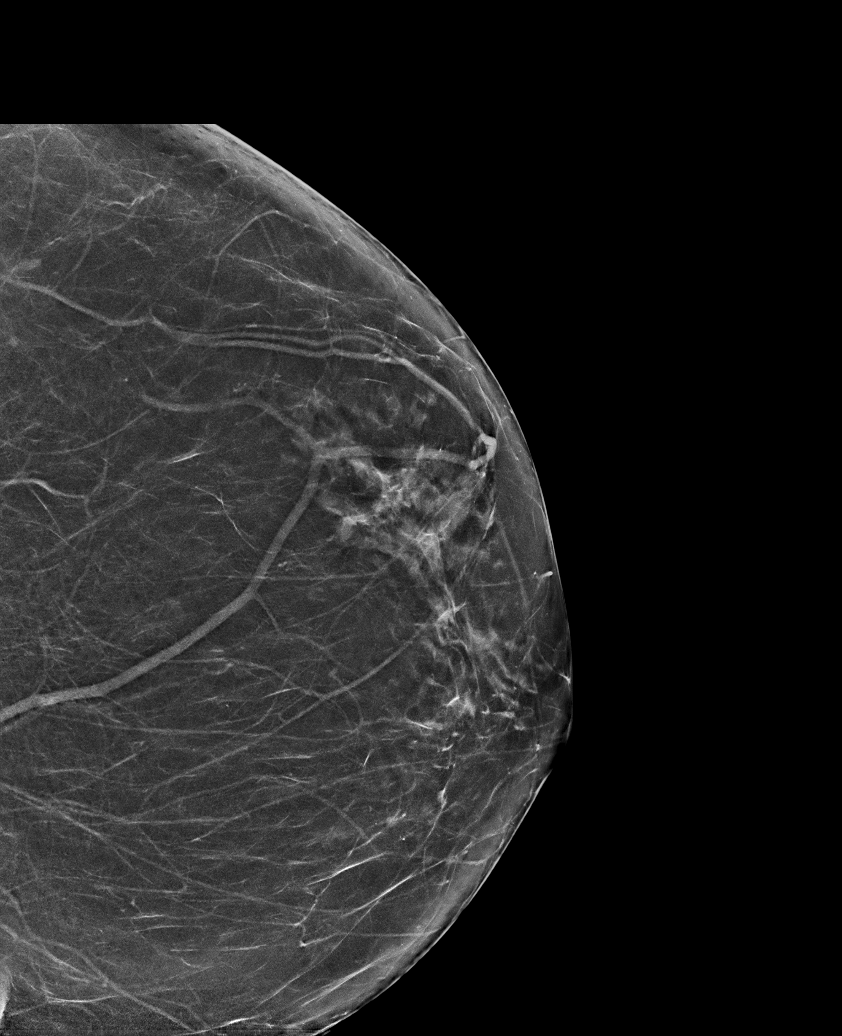

[L MLO synth-2D]
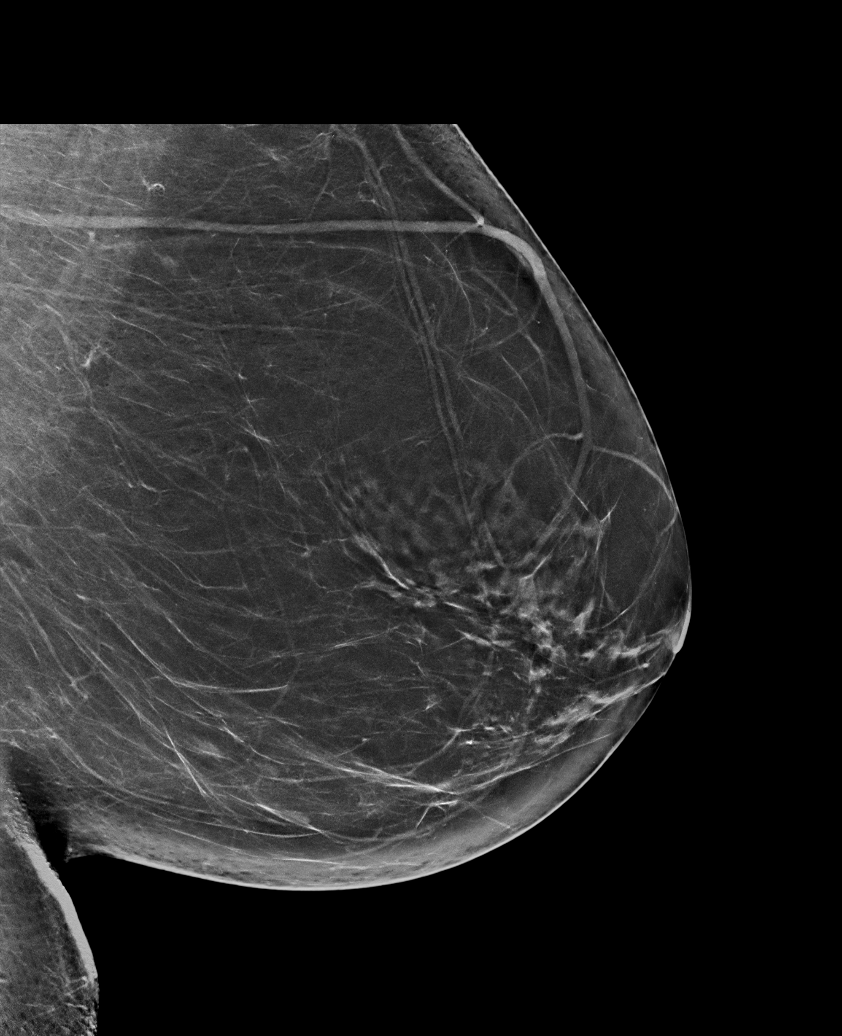

[R CC synth-2D]
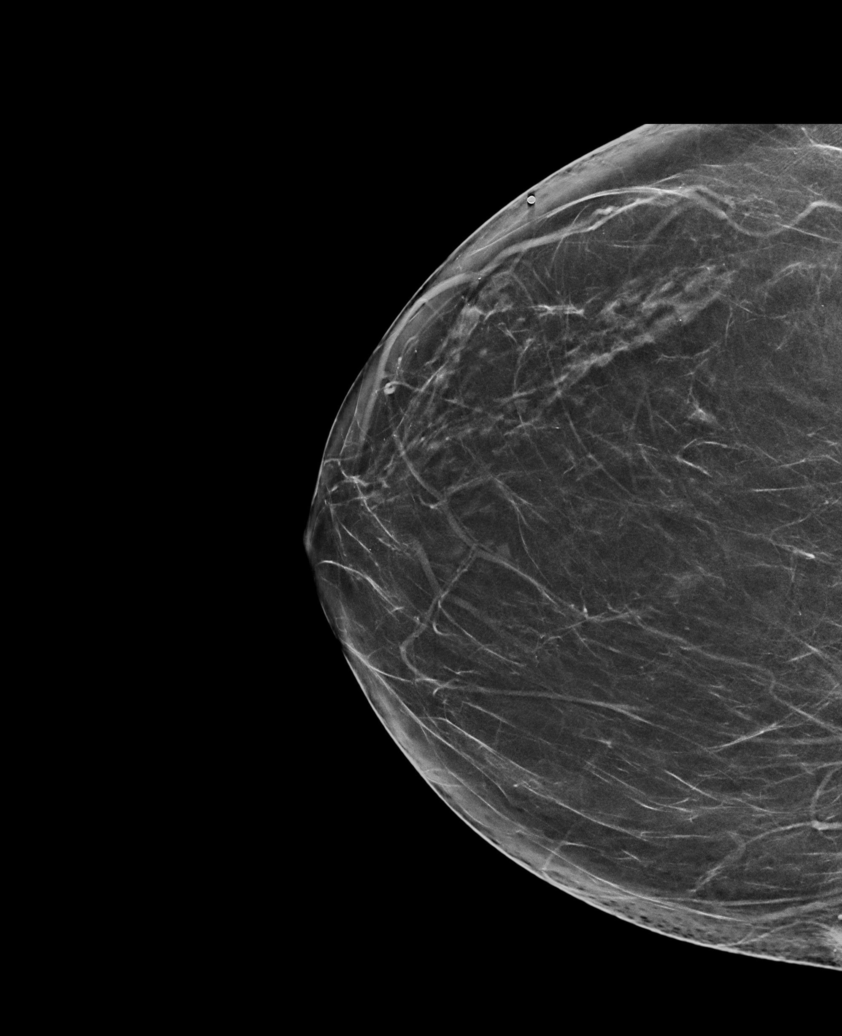

[R MLO synth-2D]
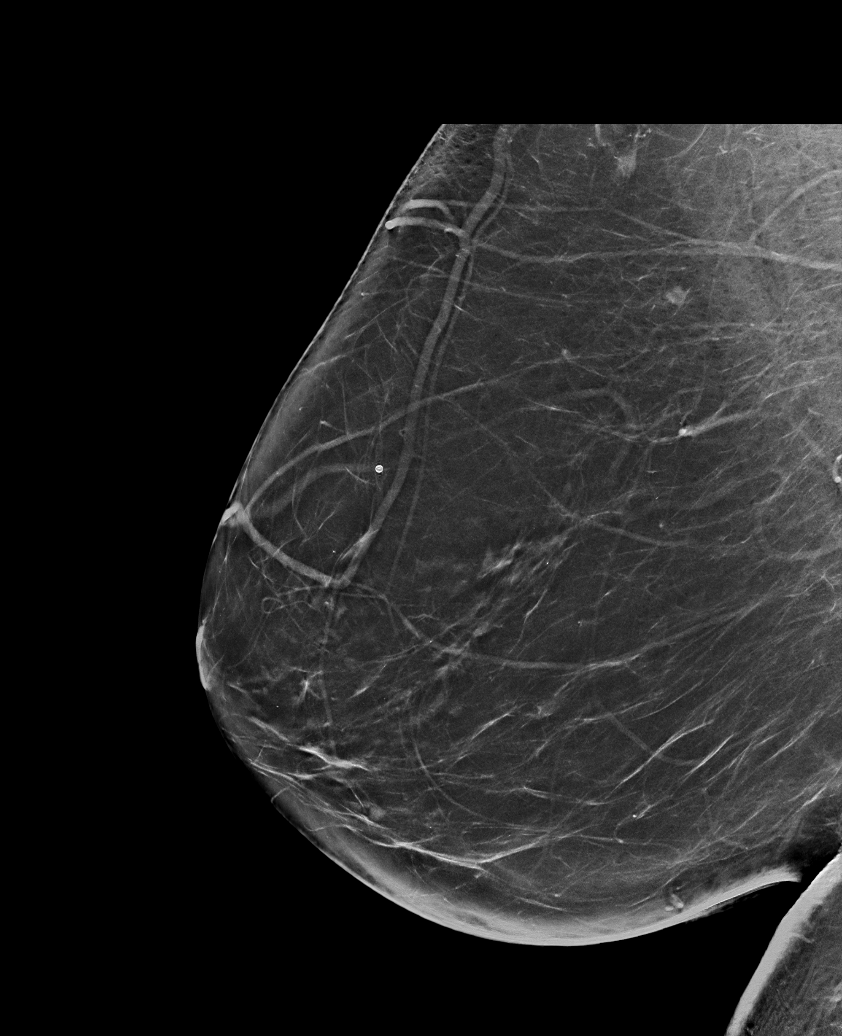

[L MLO tomo · tomo slice 42/83.0]
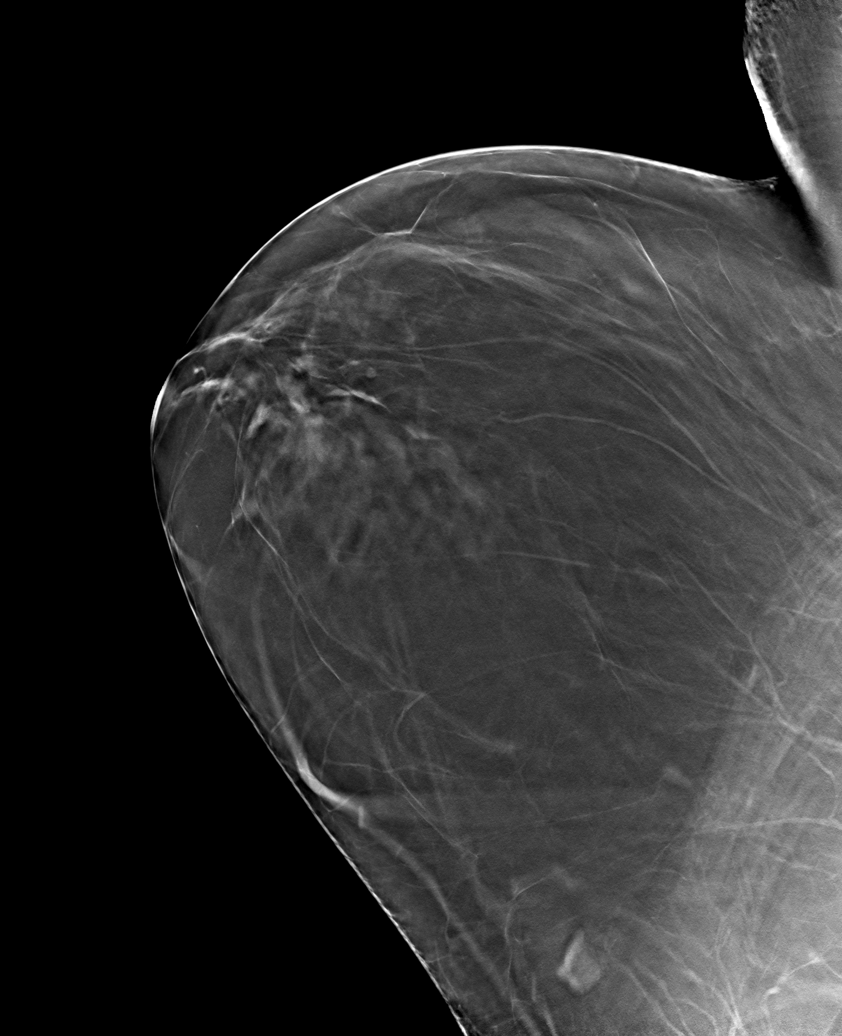

[6 of 30 positions shown; findings below may reference images not displayed]

ACR Breast Density Category b: There are scattered areas of
fibroglandular density.
FINDINGS: There are no findings suspicious for malignancy. The images were
evaluated with computer-aided detection.
IMPRESSION: No mammographic evidence of malignancy. A result letter of this
screening mammogram will be mailed directly to the patient.

RECOMMENDATION:
Screening mammogram in one year. (Code:ZP-7-VX7)

BI-RADS CATEGORY  1: Negative.

## 2023-01-24 ENCOUNTER — Ambulatory Visit: Payer: BC Managed Care – PPO | Admitting: Dermatology

## 2023-01-24 VITALS — BP 159/89 | HR 86

## 2023-01-24 DIAGNOSIS — B070 Plantar wart: Secondary | ICD-10-CM | POA: Diagnosis not present

## 2023-01-24 DIAGNOSIS — Z7189 Other specified counseling: Secondary | ICD-10-CM

## 2023-01-24 DIAGNOSIS — L820 Inflamed seborrheic keratosis: Secondary | ICD-10-CM

## 2023-01-24 DIAGNOSIS — L918 Other hypertrophic disorders of the skin: Secondary | ICD-10-CM

## 2023-01-24 DIAGNOSIS — Z85828 Personal history of other malignant neoplasm of skin: Secondary | ICD-10-CM

## 2023-01-24 DIAGNOSIS — D229 Melanocytic nevi, unspecified: Secondary | ICD-10-CM

## 2023-01-24 DIAGNOSIS — Z1283 Encounter for screening for malignant neoplasm of skin: Secondary | ICD-10-CM | POA: Diagnosis not present

## 2023-01-24 DIAGNOSIS — L814 Other melanin hyperpigmentation: Secondary | ICD-10-CM

## 2023-01-24 DIAGNOSIS — L578 Other skin changes due to chronic exposure to nonionizing radiation: Secondary | ICD-10-CM

## 2023-01-24 DIAGNOSIS — L821 Other seborrheic keratosis: Secondary | ICD-10-CM

## 2023-01-24 DIAGNOSIS — X32XXXA Exposure to sunlight, initial encounter: Secondary | ICD-10-CM

## 2023-01-24 DIAGNOSIS — D1801 Hemangioma of skin and subcutaneous tissue: Secondary | ICD-10-CM

## 2023-01-24 DIAGNOSIS — W908XXA Exposure to other nonionizing radiation, initial encounter: Secondary | ICD-10-CM

## 2023-01-24 NOTE — Progress Notes (Signed)
Follow-Up Visit   Subjective  Kristen Simon is a 72 y.o. female who presents for the following: Skin Cancer Screening and Full Body Skin Exam, hx of BCC  The patient presents for Total-Body Skin Exam (TBSE) for skin cancer screening and mole check. The patient has spots, moles and lesions to be evaluated, some may be new or changing and the patient has concerns that these could be cancer.    The following portions of the chart were reviewed this encounter and updated as appropriate: medications, allergies, medical history  Review of Systems:  No other skin or systemic complaints except as noted in HPI or Assessment and Plan.  Objective  Well appearing patient in no apparent distress; mood and affect are within normal limits.  A full examination was performed including scalp, head, eyes, ears, nose, lips, neck, chest, axillae, abdomen, back, buttocks, bilateral upper extremities, bilateral lower extremities, hands, feet, fingers, toes, fingernails, and toenails. All findings within normal limits unless otherwise noted below.   Relevant physical exam findings are noted in the Assessment and Plan.  right popliteal x 1, right groin x 1 (2) Stuck-on, waxy, tan-brown papules--Discussed benign etiology and prognosis.   right anterior lateral sole x 1 Verrucous papules -- Discussed viral etiology and contagion.     Assessment & Plan   LENTIGINES, SEBORRHEIC KERATOSES, HEMANGIOMAS - Benign normal skin lesions - Benign-appearing - Call for any changes  MELANOCYTIC NEVI - Tan-brown and/or pink-flesh-colored symmetric macules and papules - Benign appearing on exam today - Observation - Call clinic for new or changing moles - Recommend daily use of broad spectrum spf 30+ sunscreen to sun-exposed areas.   ACTINIC DAMAGE - Chronic condition, secondary to cumulative UV/sun exposure - diffuse scaly erythematous macules with underlying dyspigmentation - Recommend daily broad  spectrum sunscreen SPF 30+ to sun-exposed areas, reapply every 2 hours as needed.  - Staying in the shade or wearing long sleeves, sun glasses (UVA+UVB protection) and wide brim hats (4-inch brim around the entire circumference of the hat) are also recommended for sun protection.  - Call for new or changing lesions.  SKIN TAGS (Acrochordons) - Erythematous fleshy, skin-colored pedunculated papules - Benign appearing.  - Symptomatic, irritating, patient would like treated. - Prior to procedure, discussed risks of blister formation, small wound, skin dyspigmentation, or rare scar following treatment. Recommend Vaseline ointment to treated areas while healing. - Discussed risks (permanent scarring, infection, pain, bleeding, bruising, redness, and recurrence of the lesion) and benefits of the procedure, as well as the alternatives.  She is aware that skin tags are benign lesions, and their removal is often not considered medically necessary.  Their insurance may not cover the procedure. We recommend all patients call their insurance company to confirm whether the procedure is covered before having it done. Informed consent was obtained.  Destruction Procedure Note Destruction method: cryotherapy   Informed consent: discussed and consent obtained   Lesion destroyed using liquid nitrogen: Yes   Outcome: patient tolerated procedure well with no complications   Post-procedure details: wound care instructions given   Locations: right neck  # of Lesions Treated: 2   HISTORY OF BASAL CELL CARCINOMA OF THE SKIN Right cheek/zygoma  - No evidence of recurrence today - Recommend regular full body skin exams - Recommend daily broad spectrum sunscreen SPF 30+ to sun-exposed areas, reapply every 2 hours as needed.  - Call if any new or changing lesions are noted between office visits    Inflamed seborrheic keratosis (  2) right popliteal x 1, right groin x 1  Symptomatic, irritating, patient would like  treated.   Destruction of lesion - right popliteal x 1, right groin x 1 Complexity: simple   Destruction method: cryotherapy   Informed consent: discussed and consent obtained   Timeout:  patient name, date of birth, surgical site, and procedure verified Lesion destroyed using liquid nitrogen: Yes   Region frozen until ice ball extended beyond lesion: Yes   Outcome: patient tolerated procedure well with no complications   Post-procedure details: wound care instructions given    Plantar wart right anterior lateral sole x 1  Viral Wart (HPV) Counseling  Discussed viral / HPV (Human Papilloma Virus) etiology and risk of spread /infectivity to other areas of body as well as to other people.  Multiple treatments and methods may be required to clear warts and it is possible treatment may not be successful.  Treatment risks include discoloration; scarring and there is still potential for wart recurrence.   Destruction of lesion - right anterior lateral sole x 1 Complexity: simple   Destruction method: cryotherapy   Informed consent: discussed and consent obtained   Timeout:  patient name, date of birth, surgical site, and procedure verified Lesion destroyed using liquid nitrogen: Yes   Region frozen until ice ball extended beyond lesion: Yes   Outcome: patient tolerated procedure well with no complications   Post-procedure details: wound care instructions given    SKIN CANCER SCREENING PERFORMED TODAY.   Return in about 1 year (around 01/24/2024) for TBSE, hx of BCC .  IAngelique Holm, CMA, am acting as scribe for Armida Sans, MD .   Documentation: I have reviewed the above documentation for accuracy and completeness, and I agree with the above.  Armida Sans, MD

## 2023-01-24 NOTE — Patient Instructions (Addendum)
Cryotherapy Aftercare  Wash gently with soap and water everyday.   Apply Vaseline and Band-Aid daily until healed.     Due to recent changes in healthcare laws, you may see results of your pathology and/or laboratory studies on MyChart before the doctors have had a chance to review them. We understand that in some cases there may be results that are confusing or concerning to you. Please understand that not all results are received at the same time and often the doctors may need to interpret multiple results in order to provide you with the best plan of care or course of treatment. Therefore, we ask that you please give us 2 business days to thoroughly review all your results before contacting the office for clarification. Should we see a critical lab result, you will be contacted sooner.   If You Need Anything After Your Visit  If you have any questions or concerns for your doctor, please call our main line at [phone] and press option 4 to reach your doctor's medical assistant. If no one answers, please leave a voicemail as directed and we will return your call as soon as possible. Messages left after 4 pm will be answered the following business day.   You may also send us a message via MyChart. We typically respond to MyChart messages within 1-2 business days.  For prescription refills, please ask your pharmacy to contact our office. Our fax number is [phone].  If you have an urgent issue when the clinic is closed that cannot wait until the next business day, you can page your doctor at the number below.    Please note that while we do our best to be available for urgent issues outside of office hours, we are not available 24/7.   If you have an urgent issue and are unable to reach us, you may choose to seek medical care at your doctor's office, retail clinic, urgent care center, or emergency room.  If you have a medical emergency, please immediately call 911 or go to the  emergency department.  Pager Numbers  - Dr. Kowalski: [phone]  - Dr. Moye: [phone]  - Dr. Stewart: [phone]  In the event of inclement weather, please call our main line at [phone] for an update on the status of any delays or closures.  Dermatology Medication Tips: Please keep the boxes that topical medications come in in order to help keep track of the instructions about where and how to use these. Pharmacies typically print the medication instructions only on the boxes and not directly on the medication tubes.   If your medication is too expensive, please contact our office at [phone] option 4 or send us a message through MyChart.   We are unable to tell what your co-pay for medications will be in advance as this is different depending on your insurance coverage. However, we may be able to find a substitute medication at lower cost or fill out paperwork to get insurance to cover a needed medication.   If a prior authorization is required to get your medication covered by your insurance company, please allow us 1-2 business days to complete this process.  Drug prices often vary depending on where the prescription is filled and some pharmacies may offer cheaper prices.  The website www.goodrx.com contains coupons for medications through different pharmacies. The prices here do not account for what the cost may be with help from insurance (it may be cheaper with your insurance), but the website can   give you the price if you did not use any insurance.  - You can print the associated coupon and take it with your prescription to the pharmacy.  - You may also stop by our office during regular business hours and pick up a GoodRx coupon card.  - If you need your prescription sent electronically to a different pharmacy, notify our office through [facility] MyChart or by phone at [phone] option 4.     Si Usted Necesita Algo Despus de Su Visita  Tambin puede  enviarnos un mensaje a travs de MyChart. Por lo general respondemos a los mensajes de MyChart en el transcurso de 1 a 2 das hbiles.  Para renovar recetas, por favor pida a su farmacia que se ponga en contacto con nuestra oficina. Nuestro nmero de fax es el [phone].  Si tiene un asunto urgente cuando la clnica est cerrada y que no puede esperar hasta el siguiente da hbil, puede llamar/localizar a su doctor(a) al nmero que aparece a continuacin.   Por favor, tenga en cuenta que aunque hacemos todo lo posible para estar disponibles para asuntos urgentes fuera del horario de oficina, no estamos disponibles las 24 horas del da, los 7 das de la semana.   Si tiene un problema urgente y no puede comunicarse con nosotros, puede optar por buscar atencin mdica  en el consultorio de su doctor(a), en una clnica privada, en un centro de atencin urgente o en una sala de emergencias.  Si tiene una emergencia mdica, por favor llame inmediatamente al 911 o vaya a la sala de emergencias.  Nmeros de bper  - Dr. Kowalski: [phone]  - Dra. Moye: [phone]  - Dra. Stewart: [phone]  En caso de inclemencias del tiempo, por favor llame a nuestra lnea principal al [phone] para una actualizacin sobre el estado de cualquier retraso o cierre.  Consejos para la medicacin en dermatologa: Por favor, guarde las cajas en las que vienen los medicamentos de uso tpico para ayudarle a seguir las instrucciones sobre dnde y cmo usarlos. Las farmacias generalmente imprimen las instrucciones del medicamento slo en las cajas y no directamente en los tubos del medicamento.   Si su medicamento es muy caro, por favor, pngase en contacto con nuestra oficina llamando al [phone] y presione la opcin 4 o envenos un mensaje a travs de MyChart.   No podemos decirle cul ser su copago por los medicamentos por adelantado ya que esto es diferente dependiendo de la cobertura de su seguro.  Sin embargo, es posible que podamos encontrar un medicamento sustituto a menor costo o llenar un formulario para que el seguro cubra el medicamento que se considera necesario.   Si se requiere una autorizacin previa para que su compaa de seguros cubra su medicamento, por favor permtanos de 1 a 2 das hbiles para completar este proceso.  Los precios de los medicamentos varan con frecuencia dependiendo del lugar de dnde se surte la receta y alguna farmacias pueden ofrecer precios ms baratos.  El sitio web www.goodrx.com tiene cupones para medicamentos de diferentes farmacias. Los precios aqu no tienen en cuenta lo que podra costar con la ayuda del seguro (puede ser ms barato con su seguro), pero el sitio web puede darle el precio si no utiliz ningn seguro.  - Puede imprimir el cupn correspondiente y llevarlo con su receta a la farmacia.  - Tambin puede pasar por nuestra oficina durante el horario de atencin regular y recoger una tarjeta de cupones de GoodRx.  -   Si necesita que su receta se enve electrnicamente a una farmacia diferente, informe a nuestra oficina a travs de MyChart de [facility] o por telfono llamando al [phone] y presione la opcin 4.  

## 2023-01-31 ENCOUNTER — Encounter: Payer: Self-pay | Admitting: Dermatology

## 2023-06-06 ENCOUNTER — Inpatient Hospital Stay: Payer: Medicare HMO | Attending: Family Medicine

## 2023-06-19 ENCOUNTER — Other Ambulatory Visit: Payer: Self-pay | Admitting: Family Medicine

## 2023-06-19 DIAGNOSIS — Z1231 Encounter for screening mammogram for malignant neoplasm of breast: Secondary | ICD-10-CM

## 2023-06-29 ENCOUNTER — Ambulatory Visit
Admission: RE | Admit: 2023-06-29 | Discharge: 2023-06-29 | Disposition: A | Discharge status: Home / Self Care | Payer: BC Managed Care – PPO | Source: Ambulatory Visit | Attending: Family Medicine | Admitting: Family Medicine

## 2023-06-29 DIAGNOSIS — Z1231 Encounter for screening mammogram for malignant neoplasm of breast: Secondary | ICD-10-CM | POA: Diagnosis present

## 2023-08-01 ENCOUNTER — Inpatient Hospital Stay: Payer: Medicare HMO | Attending: Family Medicine

## 2023-11-20 ENCOUNTER — Other Ambulatory Visit: Payer: Self-pay | Admitting: Family Medicine

## 2023-11-20 DIAGNOSIS — R109 Unspecified abdominal pain: Secondary | ICD-10-CM

## 2023-11-20 DIAGNOSIS — N281 Cyst of kidney, acquired: Secondary | ICD-10-CM

## 2023-11-28 ENCOUNTER — Ambulatory Visit
Admission: RE | Admit: 2023-11-28 | Discharge: 2023-11-28 | Disposition: A | Discharge status: Home / Self Care | Source: Ambulatory Visit | Attending: Family Medicine | Admitting: Family Medicine

## 2023-11-28 DIAGNOSIS — N281 Cyst of kidney, acquired: Secondary | ICD-10-CM | POA: Diagnosis present

## 2023-11-28 DIAGNOSIS — R109 Unspecified abdominal pain: Secondary | ICD-10-CM | POA: Diagnosis present

## 2024-01-23 ENCOUNTER — Encounter: Payer: Self-pay | Admitting: Dermatology

## 2024-01-23 ENCOUNTER — Ambulatory Visit: Payer: Medicare HMO | Admitting: Dermatology

## 2024-01-23 DIAGNOSIS — B070 Plantar wart: Secondary | ICD-10-CM

## 2024-01-23 DIAGNOSIS — D229 Melanocytic nevi, unspecified: Secondary | ICD-10-CM

## 2024-01-23 DIAGNOSIS — D224 Melanocytic nevi of scalp and neck: Secondary | ICD-10-CM

## 2024-01-23 DIAGNOSIS — Z85828 Personal history of other malignant neoplasm of skin: Secondary | ICD-10-CM

## 2024-01-23 DIAGNOSIS — L578 Other skin changes due to chronic exposure to nonionizing radiation: Secondary | ICD-10-CM | POA: Diagnosis not present

## 2024-01-23 DIAGNOSIS — D239 Other benign neoplasm of skin, unspecified: Secondary | ICD-10-CM

## 2024-01-23 DIAGNOSIS — L738 Other specified follicular disorders: Secondary | ICD-10-CM

## 2024-01-23 DIAGNOSIS — L821 Other seborrheic keratosis: Secondary | ICD-10-CM

## 2024-01-23 DIAGNOSIS — L814 Other melanin hyperpigmentation: Secondary | ICD-10-CM

## 2024-01-23 DIAGNOSIS — W908XXA Exposure to other nonionizing radiation, initial encounter: Secondary | ICD-10-CM

## 2024-01-23 DIAGNOSIS — D692 Other nonthrombocytopenic purpura: Secondary | ICD-10-CM

## 2024-01-23 DIAGNOSIS — D2371 Other benign neoplasm of skin of right lower limb, including hip: Secondary | ICD-10-CM

## 2024-01-23 DIAGNOSIS — Z1283 Encounter for screening for malignant neoplasm of skin: Secondary | ICD-10-CM

## 2024-01-23 NOTE — Patient Instructions (Addendum)

## 2024-01-23 NOTE — Progress Notes (Signed)
 Follow-Up Visit   Subjective  Kristen Simon is a 73 y.o. female who presents for the following: Skin Cancer Screening and Full Body Skin Exam Spot at back of neck, left side of nose Bump at right lower leg noticed a few months ago  The patient presents for Total-Body Skin Exam (TBSE) for skin cancer screening and mole check. The patient has spots, moles and lesions to be evaluated, some may be new or changing and the patient may have concern these could be cancer.  The following portions of the chart were reviewed this encounter and updated as appropriate: medications, allergies, medical history  Review of Systems:  No other skin or systemic complaints except as noted in HPI or Assessment and Plan.  Objective  Well appearing patient in no apparent distress; mood and affect are within normal limits.  A full examination was performed including scalp, head, eyes, ears, nose, lips, neck, chest, axillae, abdomen, back, buttocks, bilateral upper extremities, bilateral lower extremities, hands, feet, fingers, toes, fingernails, and toenails. All findings within normal limits unless otherwise noted below.   Relevant physical exam findings are noted in the Assessment and Plan.  right anterior lateral sole 0.6 cm  x 1 Rough scaly patch   Assessment & Plan   SKIN CANCER SCREENING PERFORMED TODAY.  ACTINIC DAMAGE - Chronic condition, secondary to cumulative UV/sun exposure - diffuse scaly erythematous macules with underlying dyspigmentation - Recommend daily broad spectrum sunscreen SPF 30+ to sun-exposed areas, reapply every 2 hours as needed.  - Staying in the shade or wearing long sleeves, sun glasses (UVA+UVB protection) and wide brim hats (4-inch brim around the entire circumference of the hat) are also recommended for sun protection.  - Call for new or changing lesions.  LENTIGINES, SEBORRHEIC KERATOSES, HEMANGIOMAS - Benign normal skin lesions - Benign-appearing - Call for  any changes  MELANOCYTIC NEVI - 2  mm flesh colored papule at right posterior neck - benign observe - discussed if bothersome could remove in future. - Tan-brown and/or pink-flesh-colored symmetric macules and papules - Benign appearing on exam today - Observation - Call clinic for new or changing moles - Recommend daily use of broad spectrum spf 30+ sunscreen to sun-exposed areas.   Sebaceous Hyperplasia At left nose  - Small yellow papules with a central dell - Benign-appearing - Observe. Call for changes.  DERMATOFIBROMA Exam: Firm pink/brown papulenodule with dimple sign. Right medial pretibial  Treatment Plan: A dermatofibroma is a benign growth possibly related to trauma, such as an insect bite, cut from shaving, or inflamed acne-type bump.  Treatment options to remove include shave or excision with resulting scar and risk of recurrence.  Since benign-appearing and not bothersome, will observe for now.   Purpura - Chronic; persistent and recurrent.  Treatable, but not curable. - Violaceous macules and patches - Benign - Related to trauma, age, sun damage and/or use of blood thinners, chronic use of topical and/or oral steroids - Observe - Can use OTC arnica containing moisturizer such as Dermend Bruise Formula if desired - Call for worsening or other concerns  HISTORY OF BASAL CELL CARCINOMA OF THE SKIN 01/24/2017  Rt cheek zygoma  - No evidence of recurrence today - Recommend regular full body skin exams - Recommend daily broad spectrum sunscreen SPF 30+ to sun-exposed areas, reapply every 2 hours as needed.  - Call if any new or changing lesions are noted between office visits  PLANTAR WART right anterior lateral sole 0.6 cm  x 1  Vs Callous  Has been treated with Ln2 in past, also patient reports seen by podiatrist and shave preformed. It has resolved and recurred each time.  Patient is bothered by and would like treated today with Ln2  Recommend to follow up with  podiatrist for further treatment  Destruction of lesion - right anterior lateral sole 0.6 cm  x 1 Complexity: simple   Destruction method: cryotherapy   Informed consent: discussed and consent obtained   Timeout:  patient name, date of birth, surgical site, and procedure verified Lesion destroyed using liquid nitrogen: Yes   Region frozen until ice ball extended beyond lesion: Yes   Outcome: patient tolerated procedure well with no complications   Post-procedure details: wound care instructions given   SKIN CANCER SCREENING   HISTORY OF BASAL CELL CARCINOMA   ACTINIC SKIN DAMAGE   LENTIGO   MELANOCYTIC NEVUS, UNSPECIFIED LOCATION   DERMATOFIBROMA   PURPURA (HCC)   SEBACEOUS HYPERPLASIA OF FACE   Return in about 1 year (around 01/22/2025) for TBSE.  IRandee Busing, CMA, am acting as scribe for Celine Collard, MD.   Documentation: I have reviewed the above documentation for accuracy and completeness, and I agree with the above.  Celine Collard, MD

## 2024-02-18 ENCOUNTER — Other Ambulatory Visit: Payer: Self-pay | Admitting: Pulmonary Disease

## 2024-02-18 DIAGNOSIS — J449 Chronic obstructive pulmonary disease, unspecified: Secondary | ICD-10-CM

## 2024-02-22 ENCOUNTER — Ambulatory Visit
Admission: RE | Admit: 2024-02-22 | Discharge: 2024-02-22 | Disposition: A | Discharge status: Home / Self Care | Source: Ambulatory Visit | Attending: Pulmonary Disease | Admitting: Pulmonary Disease

## 2024-02-22 ENCOUNTER — Other Ambulatory Visit: Payer: Self-pay | Admitting: Pulmonary Disease

## 2024-02-22 ENCOUNTER — Ambulatory Visit
Admission: RE | Admit: 2024-02-22 | Discharge: 2024-02-22 | Disposition: A | Discharge status: Home / Self Care | Source: Ambulatory Visit | Attending: Pulmonary Disease

## 2024-02-22 DIAGNOSIS — J449 Chronic obstructive pulmonary disease, unspecified: Secondary | ICD-10-CM | POA: Diagnosis present

## 2024-02-22 DIAGNOSIS — Z87891 Personal history of nicotine dependence: Secondary | ICD-10-CM

## 2024-04-29 ENCOUNTER — Emergency Department

## 2024-04-29 ENCOUNTER — Emergency Department
Admission: EM | Admit: 2024-04-29 | Discharge: 2024-04-29 | Disposition: A | Discharge status: Home / Self Care | Attending: Emergency Medicine | Admitting: Emergency Medicine

## 2024-04-29 ENCOUNTER — Other Ambulatory Visit: Payer: Self-pay

## 2024-04-29 DIAGNOSIS — J449 Chronic obstructive pulmonary disease, unspecified: Secondary | ICD-10-CM | POA: Diagnosis not present

## 2024-04-29 DIAGNOSIS — Z7982 Long term (current) use of aspirin: Secondary | ICD-10-CM | POA: Diagnosis not present

## 2024-04-29 DIAGNOSIS — R519 Headache, unspecified: Secondary | ICD-10-CM | POA: Insufficient documentation

## 2024-04-29 DIAGNOSIS — R1084 Generalized abdominal pain: Secondary | ICD-10-CM | POA: Insufficient documentation

## 2024-04-29 DIAGNOSIS — I10 Essential (primary) hypertension: Secondary | ICD-10-CM | POA: Insufficient documentation

## 2024-04-29 DIAGNOSIS — R0789 Other chest pain: Secondary | ICD-10-CM | POA: Diagnosis present

## 2024-04-29 DIAGNOSIS — Z79899 Other long term (current) drug therapy: Secondary | ICD-10-CM | POA: Diagnosis not present

## 2024-04-29 DIAGNOSIS — R079 Chest pain, unspecified: Secondary | ICD-10-CM

## 2024-04-29 DIAGNOSIS — I2510 Atherosclerotic heart disease of native coronary artery without angina pectoris: Secondary | ICD-10-CM | POA: Diagnosis not present

## 2024-04-29 LAB — CBC
HCT: 40.1 % (ref 36.0–46.0)
Hemoglobin: 13.8 g/dL (ref 12.0–15.0)
MCH: 31.2 pg (ref 26.0–34.0)
MCHC: 34.4 g/dL (ref 30.0–36.0)
MCV: 90.7 fL (ref 80.0–100.0)
Platelets: 303 K/uL (ref 150–400)
RBC: 4.42 MIL/uL (ref 3.87–5.11)
RDW: 13.6 % (ref 11.5–15.5)
WBC: 7.7 K/uL (ref 4.0–10.5)
nRBC: 0 % (ref 0.0–0.2)

## 2024-04-29 LAB — BASIC METABOLIC PANEL WITH GFR
Anion gap: 9 (ref 5–15)
BUN: 19 mg/dL (ref 8–23)
CO2: 28 mmol/L (ref 22–32)
Calcium: 9.3 mg/dL (ref 8.9–10.3)
Chloride: 104 mmol/L (ref 98–111)
Creatinine, Ser: 0.81 mg/dL (ref 0.44–1.00)
GFR, Estimated: >60 mL/min (ref >60)
Glucose, Bld: 109 mg/dL — ABNORMAL HIGH (ref 70–99)
Potassium: 4 mmol/L (ref 3.5–5.1)
Sodium: 141 mmol/L (ref 135–145)

## 2024-04-29 LAB — TROPONIN I (HIGH SENSITIVITY)
Troponin I (High Sensitivity): 4 ng/L (ref <18)
Troponin I (High Sensitivity): 4 ng/L (ref <18)

## 2024-04-29 MED ORDER — ACETAMINOPHEN 500 MG PO TABS
1000.0000 mg | ORAL_TABLET | Freq: Once | ORAL | Status: AC
  Administered 2024-04-29: 1000 mg via ORAL
  Filled 2024-04-29: qty 2

## 2024-04-29 NOTE — ED Triage Notes (Signed)
 Patient ambulatory to triage with complaints of chest pain, jaw pain, and hypertension since 2200 last night. Patient states BP has been in 200s systolic despite taking carvedilol  at home. Patient states she has has not felt good and diagnosed with bronchitis on Thursday. Started prednisone  and doxycycline on Friday.

## 2024-04-29 NOTE — ED Provider Notes (Signed)
 [facility] Provider Note    Event Date/Time   First MD Initiated Contact with Patient 04/29/24 0119     (approximate)   History   Chest Pain and Jaw Pain   HPI  Kristen Simon is a 73 y.o. female with history of COPD, hypertension, hyperlipidemia who presents to the emergency department with chest pain that she describes as a tightness that radiated into her jaw with shortness of breath.  She is being treated for bronchitis with steroids and doxycycline.  Had negative COVID and flu test as an outpatient.  Chest pain has resolved.  Does have mild headache.  Was concerned because her blood pressure at home was in the 200s systolic.  She did take her Coreg  without relief.  No numbness, tingling, focal weakness.  No nausea or vomiting.  No diaphoresis.  No head injury.  Not on blood thinners.   History provided by patient and husband.    Past Medical History:  Diagnosis Date   Allergy    Anemia    Arthritis    Atherosclerotic peripheral vascular disease (HCC)    Breast screening, unspecified    COPD (chronic obstructive pulmonary disease) (HCC)    Coronary artery disease    COVID-19    Dysphagia    Dyspnea    With exertion   Elevated cholesterol    Fatty liver    GERD (gastroesophageal reflux disease)    Groin mass    History of basal cell carcinoma (BCC) 01/24/2017   right cheek zygoma   History of cancer chemotherapy 2013   History of rectal bleeding    Hx of radiation therapy 2013   Hypertension 2010   Internal hemorrhoids with other complication 2013   Lymphoma (HCC)    Personal history of chemotherapy    Personal history of radiation therapy    Personal history of tobacco use, presenting hazards to health    Pneumonia    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    lymphoma 2013   Stress incontinence    Stress incontinence due to pelvic organ prolapse    Ulcer 1970    stomach-resolved   Unilateral or unspecified femoral hernia with obstruction     Past Surgical History:  Procedure Laterality Date   ANOSCOPY  03/27/2012   anal biopsy/hemorrhoid-benign   APPENDECTOMY  1970   COLONOSCOPY WITH PROPOFOL  N/A 09/30/2018   Procedure: COLONOSCOPY WITH PROPOFOL ;  Surgeon: Viktoria Lamar DASEN, MD;  Location[facility] ENDOSCOPY;  Service: Endoscopy;  Laterality: N/A;   CYSTOSCOPY WITH STENT PLACEMENT N/A 07/06/2021   Procedure: CYSTOSCOPY;  Surgeon: Cam Morene ORN, MD;  Location: [facility] ORS;  Service: Urology;  Laterality: N/A;   ESOPHAGOGASTRODUODENOSCOPY N/A 09/30/2018   Procedure: ESOPHAGOGASTRODUODENOSCOPY (EGD);  Surgeon: Viktoria Lamar DASEN, MD;  Location: [facility] ENDOSCOPY;  Service: Endoscopy;  Laterality: N/A;   FRACTURE SURGERY  [phone]   right wrist,right leg   GROIN MASS OPEN BIOPSY Right 01/24/2012   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   HERNIA REPAIR Right 01/10/2012   right femerol hernia   PATELLA FRACTURE SURGERY Right 1982   PUBOVAGINAL SLING N/A 07/06/2021   Procedure: MID URETHERAL SLING;  Surgeon: Cam Morene ORN, MD;  Location: [facility] ORS;  Service: Urology;  Laterality: N/A;   ROBOTIC ASSISTED LAPAROSCOPIC SACROCOLPOPEXY Bilateral 07/06/2021   Procedure: XI ROBOTIC ASSISTED LAPAROSCOPIC SACROCOLPOPEXY/SUPRACERVICAL  HYSTERECTOMY AND BILATERAL SALPINGO OOPHERECTOMY;  Surgeon: Cam Morene ORN, MD;  Location: [facility] ORS;  Service: Urology;  Laterality: Bilateral;   SKIN CANCER EXCISION     WRIST SURGERY Right     MEDICATIONS:  Prior to Admission medications   Medication Sig Start Date End Date Taking? Authorizing Provider  acetaminophen  (TYLENOL ) 650 MG CR tablet Take 650-1,300 mg by mouth 2 (two) times daily as needed for pain.    [provider]  albuterol  (PROVENTIL  HFA;VENTOLIN  HFA) 108 (90 BASE) MCG/ACT inhaler Inhale into the lungs every 6 (six) hours as needed for wheezing or shortness of  breath.    [provider]  albuterol  (PROVENTIL ) (2.5 MG/3ML) 0.083% nebulizer solution Take 2.5 mg by nebulization every 6 (six) hours as needed for wheezing or shortness of breath (COPD).    [provider]  amoxicillin -clavulanate (AUGMENTIN) 875-125 MG tablet amoxicillin  875 mg-potassium clavulanate 125 mg tablet  TAKE 1 TABLET BY MOUTH EVERY 12 HOURS FOR 7 DAYS Patient not taking: Reported on 01/23/2024    [provider]  Ascorbic Acid  (VITAMIN C) 1000 MG tablet Take 1,000 mg by mouth 3 (three) times a week.    [provider]  aspirin  EC 81 MG tablet Take 81 mg by mouth 2 (two) times a week. Swallow whole.    [provider]  baclofen (LIORESAL) 10 MG tablet TAKE 1 TABLET BY MOUTH EVERY 12 HOURS AS DIRECTED    [provider]  Baclofen 5 MG TABS Take 5 mg by mouth 2 (two) times daily as needed (spasms).    [provider]  benzonatate (TESSALON) 200 MG capsule Take 200 mg by mouth 3 (three) times daily as needed for cough.    [provider]  Biotin  5 MG CAPS Take 1 capsule (5 mg total) by mouth daily. 07/07/21   Cam Morene ORN, MD  budesonide (PULMICORT) 1 MG/2ML nebulizer solution USE 1 VIAL IN NEBULIZER ONCE DAILY    [provider]  budesonide-formoterol (SYMBICORT) 80-4.5 MCG/ACT inhaler Inhale 1 puff into the lungs in the morning and at bedtime.    [provider]  carvedilol  (COREG ) 6.25 MG tablet Take 6.25 mg by mouth 2 (two) times daily. 06/21/21   [provider]  Cholecalciferol  (VITAMIN D3) 125 MCG (5000 UT) TABS Take 5,000 Units by mouth 3 (three) times a week.    [provider]  cyanocobalamin 1000 MCG tablet Take 1,000 mcg by mouth daily.    [provider]  estradiol  (ESTRACE  VAGINAL) 0.1 MG/GM vaginal cream Use 3 nights a week-apply a pea size amount Patient taking differently: Place 1 Applicatorful vaginally 2 (two) times a week. Use 2 nights a  week-apply a pea size amount 05/23/21   Gaston Hamilton, MD  famotidine  (PEPCID ) 20 MG tablet Take 1 tablet (20 mg total) by mouth 2 (two) times daily. Patient taking differently: Take 20 mg by mouth 2 (two) times daily as needed for indigestion or heartburn. 07/25/20   Pokhrel, Laxman, MD  famotidine  (PEPCID ) 20 MG tablet Take 1 tablet by mouth 2 (two) times daily.    [provider]  fluticasone  (FLONASE ) 50 MCG/ACT nasal spray Place 2 sprays into the nose daily as needed (allergies/congestion.). 04/05/15 12/22/56  [provider]  Lactobacillus (ACIDOPHILUS) CAPS capsule Take by mouth.    [provider]  Lactobacillus ([employer] PROBIOTIC ACIDOPHILUS) 10 MG CAPS Take 1 capsule by mouth 3 (three) times a week.    [provider]  meclizine  (ANTIVERT ) 25 MG tablet Take 25 mg by mouth 3 (three) times daily  as needed (lightheaded/nausea/dizzy).    [provider]  montelukast  (SINGULAIR ) 10 MG tablet Take 10 mg by mouth at bedtime.    [provider]  montelukast  (SINGULAIR ) 10 MG tablet Take 1 tablet by mouth at bedtime. 05/05/22   [provider]  Multiple Minerals-Vitamins (CAL MAG ZINC  +D3 PO) Take 1 tablet by mouth 3 (three) times a week. At night    [provider]  olopatadine (PATANOL) 0.1 % ophthalmic solution Place 1 drop into both eyes 2 (two) times daily.    [provider]  Omega-3 Fatty Acids (RA FISH OIL ) 1000 MG CAPS Take 1,000 mg by mouth 3 (three) times a week.    [provider]  ondansetron  (ZOFRAN -ODT) 4 MG disintegrating tablet ondansetron  4 mg disintegrating tablet  DISSOLVE 1 TABLET IN MOUTH EVERY 6 HOURS AS NEEDED FOR NAUSEA 12/15/21   [provider]  polyethylene glycol (MIRALAX  / GLYCOLAX ) 17 g packet Take 17 g by mouth daily as needed (constipation.).    [provider]  pregabalin (LYRICA) 25 MG capsule Take by oral route for 30 days.    [provider]   promethazine -dextromethorphan  (PROMETHAZINE -DM) 6.25-15 MG/5ML syrup promethazine -DM 6.25 mg-15 mg/5 mL oral syrup  TAKE 5 ML BY MOUTH EVERY 6 HOURS AS NEEDED 12/29/21   [provider]  senna-docusate (SENOKOT-S) 8.6-50 MG tablet Take 1 tablet by mouth at bedtime as needed. 12/23/15   Wilder Pink, MD  spironolactone  (ALDACTONE ) 25 MG tablet Take 25 mg by mouth in the morning. 06/22/20   [provider]  sulfamethoxazole -trimethoprim  (BACTRIM  DS) 800-160 MG tablet Take 1 tablet by mouth 2 (two) times daily. 07/07/21   Cam Morene ORN, MD  telmisartan (MICARDIS) 40 MG tablet Take 40 mg by mouth in the morning.    [provider]  traMADol  (ULTRAM ) 50 MG tablet Take 1 tablet by mouth 2 (two) times daily. 11/30/21   [provider]  triamcinolone  cream (KENALOG ) 0.1 %  05/02/22   [provider]  vitamin E  180 MG (400 UNITS) capsule Take 400 Units by mouth 3 (three) times a week.    [provider]  zinc  gluconate 50 MG tablet Take 25 mg by mouth 3 (three) times a week.    [provider]    Physical Exam   Triage Vital Signs: ED Triage Vitals  Encounter Vitals Group     BP 04/29/24 0122 (!) 164/78     Girls Systolic BP Percentile --      Girls Diastolic BP Percentile --      Boys Systolic BP Percentile --      Boys Diastolic BP Percentile --      Pulse Rate 04/29/24 0122 78     Resp 04/29/24 0122 20     Temp 04/29/24 0122 (!) 97.5 F (36.4 C)     Temp Source 04/29/24 0122 Oral     SpO2 04/29/24 0122 100 %     Weight 04/29/24 0119 158 lb (71.7 kg)     Height 04/29/24 0119 5' 2 (1.575 m)     Head Circumference --      Peak Flow --      Pain Score 04/29/24 0119 2     Pain Loc --      Pain Education --      Exclude from Growth Chart --     Most recent vital signs: Vitals:   04/29/24 0200 04/29/24 0330  BP: (!) 171/73 (!) 163/77  Pulse: 68  61  Resp: 12 14  Temp:    SpO2: 99% 95%    CONSTITUTIONAL: Alert,  responds appropriately to questions. Well-appearing; well-nourished, elderly HEAD: Normocephalic, atraumatic EYES: Conjunctivae clear, pupils appear equal, sclera nonicteric ENT: normal nose; moist mucous membranes NECK: Supple, normal ROM CARD: RRR; S1 and S2 appreciated RESP: Normal chest excursion without splinting or tachypnea; breath sounds clear and equal bilaterally; no wheezes, no rhonchi, no rales, no hypoxia or respiratory distress, speaking full sentences ABD/GI: Non-distended; soft, non-tender, no rebound, no guarding, no peritoneal signs BACK: The back appears normal EXT: Normal ROM in all joints; no deformity noted, no edema SKIN: Normal color for age and race; warm; no rash on exposed skin NEURO: Moves all extremities equally, normal speech, normal sensation, no facial asymmetry PSYCH: The patient's mood and manner are appropriate.   ED Results / Procedures / Treatments   LABS: (all labs ordered are listed, but only abnormal results are displayed) Labs Reviewed  BASIC METABOLIC PANEL WITH GFR - Abnormal; Notable for the following components:      Result Value   Glucose, Bld 109 (*)    All other components within normal limits  CBC  TROPONIN I (HIGH SENSITIVITY)  TROPONIN I (HIGH SENSITIVITY)     EKG:  EKG Interpretation Date/Time:  Tuesday April 29 2024 01:23:52 EDT Ventricular Rate:  80 PR Interval:  187 QRS Duration:  75 QT Interval:  365 QTC Calculation: 421 R Axis:   52  Text Interpretation: Normal sinus rhythm No significant change since last tracing Confirmed by Neomi Neptune [phone]) on 04/29/2024 1:26:52 AM         RADIOLOGY: My personal review and interpretation of imaging: CT head unremarkable.  Chest x-ray clear.  I have personally reviewed all radiology reports.   CT HEAD WO CONTRAST ( ) Result Date: 04/29/2024 CLINICAL DATA:  New onset headache. Also chest pain, jaw pain and hypertension. EXAM: CT HEAD WITHOUT CONTRAST TECHNIQUE:  Contiguous axial images were obtained from the base of the skull through the vertex without intravenous contrast. RADIATION DOSE REDUCTION: This exam was performed according to the departmental dose-optimization program which includes automated exposure control, adjustment of the mA and/or kV according to patient size and/or use of iterative reconstruction technique. COMPARISON:  Head CT 11/10/2018. FINDINGS: Brain: There are mild features of atrophy, and mild small-vessel disease of the cerebral white matter. The ventricles are normal in size and position. No acute cortical based infarct, hemorrhage, mass or mass effect is seen. Basal cisterns are clear. Vascular: There are scattered calcific plaques both siphons, distal vertebral arteries. No hyperdense central vessel is seen. Skull: Negative for fractures or focal lesions. Sinuses/Orbits: No acute finding. Other: None. IMPRESSION: 1. No acute intracranial CT findings. 2. Mild atrophy and small-vessel disease.  Stable exam. 3. Vascular calcifications. Electronically Signed   By: Francis Quam M.D.   On: 04/29/2024 02:31   DG Chest Port 1 View Result Date: 04/29/2024 CLINICAL DATA:  Chest pain EXAM: PORTABLE CHEST 1 VIEW COMPARISON:  08/25/2022 FINDINGS: Cardiac shadow is within normal limits. Lungs are well aerated bilaterally. No focal infiltrate or sizable effusion is seen. No bony abnormality is noted. IMPRESSION: No active disease. Electronically Signed   By: Oneil Devonshire M.D.   On: 04/29/2024 01:35     PROCEDURES:  Critical Care performed: No      .1-3 Lead EKG Interpretation  Performed by: Caoimhe Damron, Neptune SAILOR, DO Authorized by: Maurina Fawaz, Neptune SAILOR, DO     Interpretation: normal  ECG rate:  68   ECG rate assessment: normal     Rhythm: sinus rhythm     Ectopy: none     Conduction: normal       IMPRESSION / MDM / ASSESSMENT AND PLAN / ED COURSE  I reviewed the triage vital signs and the nursing notes.    Patient here with chest  pain, shortness of breath, headache, hypertension.  The patient is on the cardiac monitor to evaluate for evidence of arrhythmia and/or significant heart rate changes.   DIFFERENTIAL DIAGNOSIS (includes but not limited to):   Viral URI, pneumonia, ACS, less likely PE given reassuring heart rate, respiratory rate and oxygen saturation.  Headache may be secondary to hypertension.  Differential also includes hypertensive urgency, emergency, CHF, intracranial hemorrhage.  Doubt stroke given no neurodeficits.   Patient's presentation is most consistent with acute presentation with potential threat to life or bodily function.   PLAN: Will obtain cardiac labs, chest x-ray, head CT.  Will give Tylenol  for headache.  The chest pain has already resolved.  EKG nonischemic.  Low suspicion for PE or dissection given no chest pain currently and normal vital signs other than mild hypertension.  Will continue to monitor.   MEDICATIONS GIVEN IN ED: Medications  acetaminophen  (TYLENOL ) tablet 1,000 mg (1,000 mg Oral Given 04/29/24 0138)     ED COURSE: First troponin negative.  Second pending.  Normal electrolytes, hemoglobin.  Chest x-ray reviewed and interpreted by myself and the radiologist and is clear.  CT head also normal.   4:12 AM  Pt feeling better.  Still mildly hypertensive.  Second troponin negative.  Will discharge home with cardiology follow-up.  Discussed supportive care instructions and return precautions.    CONSULTS:  none   OUTSIDE RECORDS REVIEWED: Reviewed internal medicine note from 04/24/2024.  Patient on prednisone  6-day taper, doxycycline, promethazine  dextromethorphan .  Patient was strep, flu, RSV and COVID-negative at that time.       FINAL CLINICAL IMPRESSION(S) / ED DIAGNOSES   Final diagnoses:  Nonspecific chest pain  Generalized headache  Hypertension, unspecified type     Rx / DC Orders   ED Discharge Orders          Ordered    Ambulatory referral to  Cardiology       Comments: If you have not heard from the Cardiology office within the next 72 hours please call [phone].   04/29/24 0405             Note:  This document was prepared using Dragon voice recognition software and may include unintentional dictation errors.   Jaqualin Serpa, Josette SAILOR, DO 04/29/24 [phone]

## 2024-04-29 NOTE — Discharge Instructions (Addendum)
You may take Tylenol 1000 mg every 6 hours as needed for pain. °

## 2024-05-27 NOTE — Progress Notes (Unsigned)
  Cardiology Office Note   Date:  05/27/2024  ID:  Kristen Simon, DOB [birth year], MRN [id number] PCP: Stanton Lynwood FALCON, MD  [facility] Providers Cardiologist:  None { Click to update primary MD,subspecialty MD or APP then REFRESH:1}    History of Present Illness Kristen Simon is a 73 y.o. female PMH COPD who presents for further evaluation management of chest discomfort.  Patient was recently seen in the ED on 04/29/2024 for chest discomfort.  Serial troponin and basic labs were normal.  Given lack of any high risk signs or features, she was discharged with follow-up.  Last LDL 162 10/2023.  Relevant CVD History -CT chest 02/2024 mild LAD CAC and significant aortic atherosclerosis -Normal exercise SPECT 10/2020 - TTE 09/2020 normal biventricular function and no significant valvular disease   ROS: Pt denies any chest discomfort, jaw pain, arm pain, palpitations, syncope, presyncope, orthopnea, PND, or LE edema.  Studies Reviewed I have independently reviewed the patient's ECG, previous medical records, recent blood work, and recent CT scan.  Physical Exam VS:  There were no vitals taken for this visit.       Wt Readings from Last 3 Encounters:  04/29/24 158 lb (71.7 kg)  02/22/24 157 lb (71.2 kg)  06/07/22 156 lb 9.6 oz (71 kg)    GEN: No acute distress. NECK: No JVD; No carotid bruits. CARDIAC: ***RRR, no murmurs, rubs, gallops. RESPIRATORY:  Clear to auscultation. EXTREMITIES:  Warm and well-perfused. No edema.  ASSESSMENT AND PLAN Chest discomfort CAC Aortic atherosclerosis HLD        {Are you ordering a CV Procedure (e.g. stress test, cath, DCCV, TEE, etc)?   Press F2        :[id number]}  Dispo: ***  Signed, Caron Poser, MD

## 2024-05-30 ENCOUNTER — Ambulatory Visit

## 2024-05-30 VITALS — BP 128/62 | HR 84 | Ht 62.0 in | Wt 159.0 lb

## 2024-05-30 DIAGNOSIS — I700 Atherosclerosis of aorta: Secondary | ICD-10-CM

## 2024-05-30 DIAGNOSIS — R079 Chest pain, unspecified: Secondary | ICD-10-CM

## 2024-05-30 DIAGNOSIS — I2510 Atherosclerotic heart disease of native coronary artery without angina pectoris: Secondary | ICD-10-CM | POA: Diagnosis not present

## 2024-05-30 DIAGNOSIS — E782 Mixed hyperlipidemia: Secondary | ICD-10-CM | POA: Diagnosis not present

## 2024-05-30 DIAGNOSIS — I10 Essential (primary) hypertension: Secondary | ICD-10-CM

## 2024-05-30 DIAGNOSIS — Z789 Other specified health status: Secondary | ICD-10-CM

## 2024-05-30 MED ORDER — ASPIRIN 81 MG PO TBEC: 81.0000 mg | DELAYED_RELEASE_TABLET | Freq: Every day | ORAL | Status: AC

## 2024-05-30 MED ORDER — ROSUVASTATIN CALCIUM 10 MG PO TABS: 10.0000 mg | ORAL_TABLET | Freq: Every day | ORAL | 3 refills | Status: AC

## 2024-05-30 NOTE — Patient Instructions (Signed)
 Medication Instructions:  Your physician recommends the following medication changes.  START TAKING: Aspirin  81 mg by mouth once daily Rosuvastatin (CRESTOR) 10 mg by mouth once daily (recommend evening/bedtime)  Continue all other medications as prescribed.   *If you need a refill on your cardiac medications before your next appointment, please call your pharmacy*  Lab Work: No labs ordered today  If you have labs (blood work) drawn today and your tests are completely normal, you will receive your results only by: MyChart Message (if you have MyChart) OR A paper copy in the mail If you have any lab test that is abnormal or we need to change your treatment, we will call you to review the results.  Testing/Procedures: No test ordered today   Follow-Up: At [facility], you and your health needs are our priority.  As part of our continuing mission to provide you with exceptional heart care, our providers are all part of one team.  This team includes your primary Cardiologist (physician) and Advanced Practice Providers or APPs (Physician Assistants and Nurse Practitioners) who all work together to provide you with the care you need, when you need it.  Your next appointment:   12 month(s)  Provider:   Caron Poser, MD   We recommend signing up for the patient portal called MyChart.  Sign up information is provided on this After Visit Summary.  MyChart is used to connect with patients for Virtual Visits (Telemedicine).  Patients are able to view lab/test results, encounter notes, upcoming appointments, etc.  Non-urgent messages can be sent to your provider as well.   To learn more about what you can do with MyChart, go to [email or web address].

## 2024-07-08 ENCOUNTER — Other Ambulatory Visit: Payer: Self-pay | Admitting: Family Medicine

## 2024-07-08 DIAGNOSIS — Z1231 Encounter for screening mammogram for malignant neoplasm of breast: Secondary | ICD-10-CM

## 2024-07-10 ENCOUNTER — Ambulatory Visit
Admission: RE | Admit: 2024-07-10 | Discharge: 2024-07-10 | Disposition: A | Discharge status: Home / Self Care | Source: Ambulatory Visit | Attending: Family Medicine | Admitting: Family Medicine

## 2024-07-10 DIAGNOSIS — Z1231 Encounter for screening mammogram for malignant neoplasm of breast: Secondary | ICD-10-CM | POA: Insufficient documentation

## 2024-07-31 ENCOUNTER — Other Ambulatory Visit: Payer: Self-pay | Admitting: Family Medicine

## 2024-07-31 DIAGNOSIS — R109 Unspecified abdominal pain: Secondary | ICD-10-CM

## 2024-07-31 DIAGNOSIS — R222 Localized swelling, mass and lump, trunk: Secondary | ICD-10-CM

## 2024-08-05 ENCOUNTER — Ambulatory Visit
Admission: RE | Admit: 2024-08-05 | Discharge: 2024-08-05 | Disposition: A | Discharge status: Home / Self Care | Source: Ambulatory Visit | Attending: Family Medicine | Admitting: Family Medicine

## 2024-08-05 DIAGNOSIS — R222 Localized swelling, mass and lump, trunk: Secondary | ICD-10-CM | POA: Diagnosis present

## 2024-08-05 DIAGNOSIS — R109 Unspecified abdominal pain: Secondary | ICD-10-CM | POA: Diagnosis present

## 2025-01-28 ENCOUNTER — Ambulatory Visit: Admitting: Dermatology
